# Patient Record
Sex: Male | Born: 1960
Health system: Southern US, Community
[De-identification: ages and names within clinical notes are randomized; demographics above are authoritative.]

## PROBLEM LIST (undated history)

## (undated) DIAGNOSIS — M545 Low back pain, unspecified: Secondary | ICD-10-CM

## (undated) DIAGNOSIS — I639 Cerebral infarction, unspecified: Secondary | ICD-10-CM

## (undated) DIAGNOSIS — K219 Gastro-esophageal reflux disease without esophagitis: Secondary | ICD-10-CM

## (undated) DIAGNOSIS — F329 Major depressive disorder, single episode, unspecified: Secondary | ICD-10-CM

## (undated) DIAGNOSIS — I10 Essential (primary) hypertension: Secondary | ICD-10-CM

## (undated) DIAGNOSIS — M1990 Unspecified osteoarthritis, unspecified site: Secondary | ICD-10-CM

## (undated) DIAGNOSIS — G4733 Obstructive sleep apnea (adult) (pediatric): Secondary | ICD-10-CM

## (undated) DIAGNOSIS — I219 Acute myocardial infarction, unspecified: Secondary | ICD-10-CM

## (undated) DIAGNOSIS — I2510 Atherosclerotic heart disease of native coronary artery without angina pectoris: Secondary | ICD-10-CM

## (undated) DIAGNOSIS — Z9981 Dependence on supplemental oxygen: Secondary | ICD-10-CM

## (undated) DIAGNOSIS — E7800 Pure hypercholesterolemia, unspecified: Secondary | ICD-10-CM

## (undated) DIAGNOSIS — F32A Depression, unspecified: Secondary | ICD-10-CM

## (undated) DIAGNOSIS — I509 Heart failure, unspecified: Secondary | ICD-10-CM

## (undated) DIAGNOSIS — G8929 Other chronic pain: Secondary | ICD-10-CM

## (undated) HISTORY — PX: FRACTURE SURGERY: SHX138

## (undated) HISTORY — PX: CORONARY ANGIOPLASTY WITH STENT PLACEMENT: SHX49

## (undated) HISTORY — PX: CORONARY ANGIOPLASTY: SHX604

---

## 1988-02-25 HISTORY — PX: TOTAL HIP ARTHROPLASTY: SHX124

## 2003-02-25 DIAGNOSIS — I639 Cerebral infarction, unspecified: Secondary | ICD-10-CM

## 2003-02-25 HISTORY — DX: Cerebral infarction, unspecified: I63.9

## 2008-04-23 ENCOUNTER — Inpatient Hospital Stay: Payer: Self-pay | Admitting: Internal Medicine

## 2008-06-11 ENCOUNTER — Emergency Department: Payer: Self-pay | Admitting: Emergency Medicine

## 2014-02-24 HISTORY — PX: ANKLE FRACTURE SURGERY: SHX122

## 2016-08-27 ENCOUNTER — Encounter (HOSPITAL_COMMUNITY): Payer: Self-pay | Admitting: Emergency Medicine

## 2016-08-27 ENCOUNTER — Emergency Department (HOSPITAL_COMMUNITY): Payer: Self-pay

## 2016-08-27 ENCOUNTER — Inpatient Hospital Stay (HOSPITAL_COMMUNITY)
Admission: EM | Admit: 2016-08-27 | Discharge: 2016-08-29 | DRG: 251 | Disposition: A | Discharge status: Home / Self Care | Payer: Self-pay | Attending: Internal Medicine | Admitting: Internal Medicine

## 2016-08-27 DIAGNOSIS — Z955 Presence of coronary angioplasty implant and graft: Secondary | ICD-10-CM

## 2016-08-27 DIAGNOSIS — Z9861 Coronary angioplasty status: Secondary | ICD-10-CM

## 2016-08-27 DIAGNOSIS — T82855A Stenosis of coronary artery stent, initial encounter: Principal | ICD-10-CM | POA: Diagnosis present

## 2016-08-27 DIAGNOSIS — Z7982 Long term (current) use of aspirin: Secondary | ICD-10-CM

## 2016-08-27 DIAGNOSIS — E785 Hyperlipidemia, unspecified: Secondary | ICD-10-CM

## 2016-08-27 DIAGNOSIS — Z8249 Family history of ischemic heart disease and other diseases of the circulatory system: Secondary | ICD-10-CM

## 2016-08-27 DIAGNOSIS — I25110 Atherosclerotic heart disease of native coronary artery with unstable angina pectoris: Secondary | ICD-10-CM | POA: Diagnosis present

## 2016-08-27 DIAGNOSIS — R079 Chest pain, unspecified: Secondary | ICD-10-CM

## 2016-08-27 DIAGNOSIS — I10 Essential (primary) hypertension: Secondary | ICD-10-CM | POA: Diagnosis present

## 2016-08-27 DIAGNOSIS — I2510 Atherosclerotic heart disease of native coronary artery without angina pectoris: Secondary | ICD-10-CM

## 2016-08-27 DIAGNOSIS — I252 Old myocardial infarction: Secondary | ICD-10-CM

## 2016-08-27 DIAGNOSIS — Z79899 Other long term (current) drug therapy: Secondary | ICD-10-CM

## 2016-08-27 HISTORY — DX: Dependence on supplemental oxygen: Z99.81

## 2016-08-27 HISTORY — DX: Major depressive disorder, single episode, unspecified: F32.9

## 2016-08-27 HISTORY — DX: Low back pain, unspecified: M54.50

## 2016-08-27 HISTORY — DX: Heart failure, unspecified: I50.9

## 2016-08-27 HISTORY — DX: Essential (primary) hypertension: I10

## 2016-08-27 HISTORY — DX: Unspecified osteoarthritis, unspecified site: M19.90

## 2016-08-27 HISTORY — DX: Low back pain: M54.5

## 2016-08-27 HISTORY — DX: Gastro-esophageal reflux disease without esophagitis: K21.9

## 2016-08-27 HISTORY — DX: Cerebral infarction, unspecified: I63.9

## 2016-08-27 HISTORY — DX: Acute myocardial infarction, unspecified: I21.9

## 2016-08-27 HISTORY — DX: Other chronic pain: G89.29

## 2016-08-27 HISTORY — DX: Pure hypercholesterolemia, unspecified: E78.00

## 2016-08-27 HISTORY — DX: Depression, unspecified: F32.A

## 2016-08-27 HISTORY — DX: Atherosclerotic heart disease of native coronary artery without angina pectoris: I25.10

## 2016-08-27 HISTORY — DX: Obstructive sleep apnea (adult) (pediatric): G47.33

## 2016-08-27 LAB — BASIC METABOLIC PANEL
Anion gap: 7 (ref 5–15)
BUN: <5 mg/dL — ABNORMAL LOW (ref 6–20)
CHLORIDE: 109 mmol/L (ref 101–111)
CO2: 26 mmol/L (ref 22–32)
CREATININE: 0.9 mg/dL (ref 0.61–1.24)
Calcium: 9.4 mg/dL (ref 8.9–10.3)
GFR calc Af Amer: >60 mL/min (ref >60)
GFR calc non Af Amer: >60 mL/min (ref >60)
Glucose, Bld: 99 mg/dL (ref 65–99)
Potassium: 3.6 mmol/L (ref 3.5–5.1)
Sodium: 142 mmol/L (ref 135–145)

## 2016-08-27 LAB — CBC
HCT: 40.6 % (ref 39.0–52.0)
HEMOGLOBIN: 13.9 g/dL (ref 13.0–17.0)
MCH: 32.1 pg (ref 26.0–34.0)
MCHC: 34.2 g/dL (ref 30.0–36.0)
MCV: 93.8 fL (ref 78.0–100.0)
PLATELETS: 164 10*3/uL (ref 150–400)
RBC: 4.33 MIL/uL (ref 4.22–5.81)
RDW: 13.4 % (ref 11.5–15.5)
WBC: 5.3 10*3/uL (ref 4.0–10.5)

## 2016-08-27 LAB — I-STAT TROPONIN, ED: Troponin i, poc: 0.01 ng/mL (ref 0.00–0.08)

## 2016-08-27 MED ORDER — NITROGLYCERIN 0.4 MG SL SUBL
0.4000 mg | SUBLINGUAL_TABLET | SUBLINGUAL | Status: DC | PRN
  Administered 2016-08-27: 0.4 mg via SUBLINGUAL
  Filled 2016-08-27: qty 1

## 2016-08-27 MED ORDER — HYDROCODONE-ACETAMINOPHEN 7.5-325 MG PO TABS
1.0000 | ORAL_TABLET | Freq: Four times a day (QID) | ORAL | Status: DC | PRN
  Administered 2016-08-28: 1 via ORAL
  Filled 2016-08-27: qty 2

## 2016-08-27 MED ORDER — ACETAMINOPHEN 325 MG PO TABS: 650.0000 mg | ORAL_TABLET | ORAL | Status: DC | PRN

## 2016-08-27 MED ORDER — ONDANSETRON HCL 4 MG/2ML IJ SOLN: 4.0000 mg | Freq: Four times a day (QID) | INTRAMUSCULAR | Status: DC | PRN

## 2016-08-27 NOTE — ED Notes (Signed)
Pt c/o chest pain 8/10, admitting MD notified.

## 2016-08-27 NOTE — Progress Notes (Addendum)
ANTICOAGULATION CONSULT NOTE - Initial Consult  Pharmacy Consult for heparin Indication: chest pain/ACS  No Known Allergies  Patient Measurements: Height: 5\' 10"  (177.8 cm) Weight: 165 lb (74.8 kg) IBW/kg (Calculated) : 73 Heparin Dosing Weight: 74.8 kg   Vital Signs: Temp: 97.7 F (36.5 C) (07/04 2134) Temp Source: Oral (07/04 2134) BP: 121/76 (07/04 2315) Pulse Rate: 85 (07/04 2315)  Labs:  Recent Labs  08/27/16 2200  HGB 13.9  HCT 40.6  PLT 164  CREATININE 0.90    Estimated Creatinine Clearance: 93.5 mL/min (by C-G formula based on SCr of 0.9 mg/dL).   Medical History: Past Medical History:  Diagnosis Date  . Hypertension   . MI (myocardial infarction) [facility](HCC)     Assessment: 56 yo male admitted with chest pain. Pharmacy to dose heparin. No oral anticoagulation prior to admission. CBC stable with no overt s/s bleeding.   Goal of Therapy:  Heparin level 0.3-0.7 units/ml Monitor platelets by anticoagulation protocol: Yes   Plan:  Heparin 4000 units IV x1, then start infusion at 900 units/hr Heparin level in 6 hours  Heparin level and CBC daily  Monitor for s/s bleeding F/u cardiology plans   York CeriseKatherine Cook, PharmD Clinical Pharmacist 08/27/16 11:56 PM

## 2016-08-27 NOTE — H&P (Addendum)
CARDIOLOGY OBSERVATION HISTORY AND PHYSICAL EXAMINATION NOTE  Patient ID: Marcelina MorelRex D Dauenhauer MRN: [id number], DOB/AGE: [birth year]   Admit date: 08/27/2016   Primary Physician: Patient, No Pcp Per Primary Cardiologist: new  Reason for admission: chest pain  HPI: This is a 56 y.o. male with known history of  MI s/p multiple PCIs (records not availble), HTN presented with chest pain.  CP started @ 8pm last night when he woke up with CP , substernal, no radiation, 10/10 in intensity, pressure like sensation. Associated with nausea, diaphoresis and dyspnea. He took 2 NTG but did not resolve. On arrival given 4 NTG and 4 mg of IV morphine w/o much relief. No CHF symptoms. [facility] since has moved here. [facility] and received a stent at that time.  Angina frequency 1/ 19mo, usually takes NTG for relief. he sweeps and cleans. He is not physically limited due to angina. Per patient he had 16 stents until now and the doctors at [facility] were considering to send him for CABG but [place].    Problem List: Past Medical History:  Diagnosis Date  . Hypertension   . MI (myocardial infarction) [facility](HCC)     Past Surgical History:  Procedure Laterality Date  . CARDIAC CATHETERIZATION       Allergies: No Known Allergies   Home Medications Current Facility-Administered Medications  Medication Dose Route Frequency Provider Last Rate Last Dose  . nitroGLYCERIN (NITROSTAT) SL tablet 0.4 mg  0.4 mg Sublingual Q5 min PRN Alvira MondaySchlossman, Erin, MD   0.4 mg at 08/27/16 2307   Current Outpatient Prescriptions  Medication Sig Dispense Refill  . amLODipine (NORVASC) 5 MG tablet Take 5 mg by mouth daily.    Marland Kitchen. aspirin 325 MG tablet Take 325 mg by mouth daily.    Marland Kitchen. atorvastatin (LIPITOR) 80 MG tablet Take 80 mg by mouth daily.    . clopidogrel (PLAVIX) 75 MG tablet Take 75 mg by mouth daily.    . isosorbide  dinitrate (ISORDIL) 20 MG tablet Take 20 mg by mouth 3 (three) times daily.    . metoprolol tartrate (LOPRESSOR) 50 MG tablet Take 50 mg by mouth 2 (two) times daily.    . nitroGLYCERIN (NITROSTAT) 0.6 MG SL tablet Place 0.6 mg under the tongue every 5 (five) minutes as needed for chest pain.    Marland Kitchen. omeprazole (PRILOSEC) 20 MG capsule Take 20 mg by mouth daily.      Family History  Problem Relation Age of Onset  . Heart attack Father     Social History   Social History  . Marital status: Single    Spouse name: N/A  . Number of children: N/A  . Years of education: N/A   Occupational History  . Not on file.   Social History Main Topics  . Smoking status: Never Smoker  . Smokeless tobacco: Never Used  . Alcohol use No  . Drug use: No  . Sexual activity: Not on file   Other Topics Concern  . Not on file   Social History Narrative  . No narrative on file     Review of Systems: General: negative for chills, fever, night sweats or weight changes.  Cardiovascular: chest pain, dyspnea negative for dyspnea on exertion, edema, orthopnea, palpitations, paroxysmal nocturnal dyspnea Dermatological: negative for rash Respiratory: negative for cough or wheezing Urologic: negative for hematuria Abdominal: negative for nausea, vomiting,  diarrhea, bright red blood per rectum, melena, or hematemesis Neurologic: negative for visual changes, syncope, or dizziness Endocrine: no diabetes, no hypothyroidism Immunological: no lymph adenopathy Psych: non homicidal/suicidal  Physical Exam: Vitals: BP 121/73   Pulse 78   Temp 97.7 F (36.5 C) (Oral)   Resp 15   Ht 5\' 10"  (1.778 m)   Wt 74.8 kg (165 lb)   SpO2 100%   BMI 23.68 kg/m  General: not in acute distress Neck: JVP flat, neck supple Heart: regular rate and rhythm, S1, S2, no murmurs Lungs: CTAB  GI: non tender, non distended, bowel sounds present Extremities: no edema Neuro: AAO x 3  Psych: normal affect, no  anxiety   Labs:   Results for orders placed or performed during the hospital encounter of 08/27/16 (from the past 24 hour(s))  Basic metabolic panel     Status: Abnormal   Collection Time: 08/27/16 10:00 PM  Result Value Ref Range   Sodium 142 135 - 145 mmol/L   Potassium 3.6 3.5 - 5.1 mmol/L   Chloride 109 101 - 111 mmol/L   CO2 26 22 - 32 mmol/L   Glucose, Bld 99 65 - 99 mg/dL   BUN <5 (L) 6 - 20 mg/dL   Creatinine, Ser 1.61 0.61 - 1.24 mg/dL   Calcium 9.4 8.9 - 09.6 mg/dL   GFR calc non Af Amer >60 >60 mL/min   GFR calc Af Amer >60 >60 mL/min   Anion gap 7 5 - 15  CBC     Status: None   Collection Time: 08/27/16 10:00 PM  Result Value Ref Range   WBC 5.3 4.0 - 10.5 K/uL   RBC 4.33 4.22 - 5.81 MIL/uL   Hemoglobin 13.9 13.0 - 17.0 g/dL   HCT 04.5 40.9 - 81.1 %   MCV 93.8 78.0 - 100.0 fL   MCH 32.1 26.0 - 34.0 pg   MCHC 34.2 30.0 - 36.0 g/dL   RDW 91.4 78.2 - 95.6 %   Platelets 164 150 - 400 K/uL  I-stat troponin, ED     Status: None   Collection Time: 08/27/16 10:12 PM  Result Value Ref Range   Troponin i, poc 0.01 0.00 - 0.08 ng/mL   Comment 3             Radiology/Studies: Dg Chest 2 View  Result Date: 08/27/2016 CLINICAL DATA:  Chest pain EXAM: CHEST  2 VIEW COMPARISON:  None. FINDINGS: The lungs are hyperexpanded with prominent interstitial markings. No focal consolidation or overt pulmonary edema. Cardiomediastinal contours are normal. No pleural effusion or pneumothorax. IMPRESSION: Hyperinflation suggesting COPD without acute cardiopulmonary disease. Electronically Signed   By: Deatra Robinson M.D.   On: 08/27/2016 22:33     Medical decision making:  Discussed care with the patient Discussed care with the physician on the phone Reviewed labs and imaging personally Reviewed prior records  ASSESSMENT AND PLAN:  This is a 56 y.o. male with known CAD s/p several PCIs and MIs presented with another episode of chest pain not relieved with NTG with negative trop and  some repol changes on EKG. Records not available.   Active Problems:   Chest pain with moderate risk of acute coronary syndrome  Chest pain with moderate risk of ACS HEART SCORE = 5 (moderate risk)  increasing episodes of chest pain, pain at rest - admit to telemetry floor  - recommend IV heparin - cycle troponin - echocardiogram in the AM - NPO post midnight - CBC,  CMP, INR/PTT - TSH, HbA1c, lipid panel - obtain records from outside hospital and decide on if needs cardiac cath vs. Stress test  - added imdur 90 mg daily  Hypertension, essential Continue home blood pressure meds Goal <130/90   Signed, Joellyn Rued, MD MS 08/27/2016, 11:19 PM

## 2016-08-27 NOTE — ED Triage Notes (Signed)
Per EMS, pt from home with c/o central chest pain/pressure, pt denies radiation. Pt diaphoretic initially, c/o mild shortness of breath. Hx of MI x 3, with 16 stents. Pt has had total of 4 NTG and 4mg  morphine with no relief. EMS vitals: BP-1285/90, HR-140

## 2016-08-28 ENCOUNTER — Encounter (HOSPITAL_COMMUNITY): Payer: Self-pay | Admitting: Internal Medicine

## 2016-08-28 ENCOUNTER — Encounter (HOSPITAL_COMMUNITY)
Admission: EM | Disposition: A | Discharge status: Home / Self Care | Payer: Self-pay | Source: Home / Self Care | Attending: Internal Medicine

## 2016-08-28 DIAGNOSIS — I10 Essential (primary) hypertension: Secondary | ICD-10-CM | POA: Diagnosis present

## 2016-08-28 DIAGNOSIS — I2510 Atherosclerotic heart disease of native coronary artery without angina pectoris: Secondary | ICD-10-CM

## 2016-08-28 DIAGNOSIS — I25118 Atherosclerotic heart disease of native coronary artery with other forms of angina pectoris: Secondary | ICD-10-CM

## 2016-08-28 DIAGNOSIS — R079 Chest pain, unspecified: Secondary | ICD-10-CM

## 2016-08-28 HISTORY — PX: CORONARY BALLOON ANGIOPLASTY: CATH118233

## 2016-08-28 HISTORY — PX: LEFT HEART CATH AND CORONARY ANGIOGRAPHY: CATH118249

## 2016-08-28 HISTORY — DX: Atherosclerotic heart disease of native coronary artery without angina pectoris: I25.10

## 2016-08-28 LAB — LIPID PANEL
Cholesterol: 125 mg/dL (ref 0–200)
HDL: 74 mg/dL (ref >40)
LDL CALC: 40 mg/dL (ref 0–99)
Total CHOL/HDL Ratio: 1.7 RATIO
Triglycerides: 57 mg/dL (ref <150)
VLDL: 11 mg/dL (ref 0–40)

## 2016-08-28 LAB — CBC
HCT: 40.2 % (ref 39.0–52.0)
HEMATOCRIT: 41 % (ref 39.0–52.0)
HEMOGLOBIN: 13.8 g/dL (ref 13.0–17.0)
Hemoglobin: 13.5 g/dL (ref 13.0–17.0)
MCH: 31.8 pg (ref 26.0–34.0)
MCH: 32.3 pg (ref 26.0–34.0)
MCHC: 33.6 g/dL (ref 30.0–36.0)
MCHC: 33.7 g/dL (ref 30.0–36.0)
MCV: 94.8 fL (ref 78.0–100.0)
MCV: 96 fL (ref 78.0–100.0)
PLATELETS: 152 10*3/uL (ref 150–400)
Platelets: 136 10*3/uL — ABNORMAL LOW (ref 150–400)
RBC: 4.24 MIL/uL (ref 4.22–5.81)
RBC: 4.27 MIL/uL (ref 4.22–5.81)
RDW: 13.6 % (ref 11.5–15.5)
RDW: 13.7 % (ref 11.5–15.5)
WBC: 5.9 10*3/uL (ref 4.0–10.5)
WBC: 5.6 10*3/uL (ref 4.0–10.5)

## 2016-08-28 LAB — BRAIN NATRIURETIC PEPTIDE: B NATRIURETIC PEPTIDE 5: 40.4 pg/mL (ref 0.0–100.0)

## 2016-08-28 LAB — RAPID URINE DRUG SCREEN, HOSP PERFORMED
AMPHETAMINES: NOT DETECTED
Barbiturates: NOT DETECTED
Benzodiazepines: NOT DETECTED
Cocaine: NOT DETECTED
Opiates: POSITIVE — AB
Tetrahydrocannabinol: NOT DETECTED

## 2016-08-28 LAB — COMPREHENSIVE METABOLIC PANEL
ALT: 16 U/L — AB (ref 17–63)
AST: 20 U/L (ref 15–41)
Albumin: 3.9 g/dL (ref 3.5–5.0)
Alkaline Phosphatase: 69 U/L (ref 38–126)
Anion gap: 9 (ref 5–15)
BILIRUBIN TOTAL: 0.8 mg/dL (ref 0.3–1.2)
CALCIUM: 9.1 mg/dL (ref 8.9–10.3)
CHLORIDE: 109 mmol/L (ref 101–111)
CO2: 24 mmol/L (ref 22–32)
CREATININE: 0.94 mg/dL (ref 0.61–1.24)
Glucose, Bld: 84 mg/dL (ref 65–99)
Potassium: 3.7 mmol/L (ref 3.5–5.1)
Sodium: 142 mmol/L (ref 135–145)
TOTAL PROTEIN: 6.2 g/dL — AB (ref 6.5–8.1)

## 2016-08-28 LAB — TROPONIN I
Troponin I: <0.03 ng/mL (ref <0.03)
Troponin I: <0.03 ng/mL (ref <0.03)

## 2016-08-28 LAB — CREATININE, SERUM
Creatinine, Ser: 0.95 mg/dL (ref 0.61–1.24)
GFR calc Af Amer: >60 mL/min (ref >60)
GFR calc non Af Amer: >60 mL/min (ref >60)

## 2016-08-28 LAB — MRSA PCR SCREENING: MRSA BY PCR: NEGATIVE

## 2016-08-28 LAB — PROTIME-INR
INR: 0.94
PROTHROMBIN TIME: 12.6 s (ref 11.4–15.2)

## 2016-08-28 LAB — GLUCOSE, CAPILLARY: Glucose-Capillary: 90 mg/dL (ref 65–99)

## 2016-08-28 LAB — HIV ANTIBODY (ROUTINE TESTING W REFLEX): HIV Screen 4th Generation wRfx: NONREACTIVE

## 2016-08-28 LAB — HEPARIN LEVEL (UNFRACTIONATED): Heparin Unfractionated: 0.33 IU/mL (ref 0.30–0.70)

## 2016-08-28 LAB — TSH: TSH: 4.605 u[IU]/mL — AB (ref 0.350–4.500)

## 2016-08-28 SURGERY — LEFT HEART CATH AND CORONARY ANGIOGRAPHY
Anesthesia: LOCAL | Laterality: Right

## 2016-08-28 MED ORDER — NITROGLYCERIN IN D5W 200-5 MCG/ML-% IV SOLN: 0.0000 ug/min | INTRAVENOUS | Status: DC

## 2016-08-28 MED ORDER — FENTANYL CITRATE (PF) 100 MCG/2ML IJ SOLN
INTRAMUSCULAR | Status: AC
  Filled 2016-08-28: qty 2

## 2016-08-28 MED ORDER — IOPAMIDOL (ISOVUE-370) INJECTION 76%
INTRAVENOUS | Status: AC
  Filled 2016-08-28: qty 100

## 2016-08-28 MED ORDER — ACETAMINOPHEN 325 MG PO TABS: 650.0000 mg | ORAL_TABLET | ORAL | Status: DC | PRN

## 2016-08-28 MED ORDER — NITROGLYCERIN 0.6 MG SL SUBL: 0.6000 mg | SUBLINGUAL_TABLET | SUBLINGUAL | Status: DC | PRN

## 2016-08-28 MED ORDER — IOPAMIDOL (ISOVUE-370) INJECTION 76%
INTRAVENOUS | Status: AC
  Filled 2016-08-28: qty 50

## 2016-08-28 MED ORDER — SODIUM CHLORIDE 0.9 % IV SOLN
INTRAVENOUS | Status: DC
  Administered 2016-08-28: 09:00:00 via INTRAVENOUS
  Administered 2016-08-28: 250 mL via INTRAVENOUS

## 2016-08-28 MED ORDER — HEPARIN SODIUM (PORCINE) 1000 UNIT/ML IJ SOLN
INTRAMUSCULAR | Status: AC
  Filled 2016-08-28: qty 1

## 2016-08-28 MED ORDER — SODIUM CHLORIDE 0.9% FLUSH: 3.0000 mL | INTRAVENOUS | Status: DC | PRN

## 2016-08-28 MED ORDER — ASPIRIN 81 MG PO CHEW
81.0000 mg | CHEWABLE_TABLET | Freq: Every day | ORAL | Status: DC
  Administered 2016-08-29: 81 mg via ORAL
  Filled 2016-08-28: qty 1

## 2016-08-28 MED ORDER — FENTANYL CITRATE (PF) 100 MCG/2ML IJ SOLN
INTRAMUSCULAR | Status: DC | PRN
  Administered 2016-08-28: 50 ug via INTRAVENOUS

## 2016-08-28 MED ORDER — AMLODIPINE BESYLATE 5 MG PO TABS
5.0000 mg | ORAL_TABLET | Freq: Every day | ORAL | Status: DC
  Administered 2016-08-28 – 2016-08-29 (×2): 5 mg via ORAL
  Filled 2016-08-28 (×2): qty 1

## 2016-08-28 MED ORDER — ASPIRIN 81 MG PO CHEW: 81.0000 mg | CHEWABLE_TABLET | ORAL | Status: DC

## 2016-08-28 MED ORDER — LABETALOL HCL 5 MG/ML IV SOLN: 10.0000 mg | INTRAVENOUS | Status: AC | PRN

## 2016-08-28 MED ORDER — SODIUM CHLORIDE 0.9 % IV SOLN: 250.0000 mL | INTRAVENOUS | Status: DC | PRN

## 2016-08-28 MED ORDER — CLOPIDOGREL BISULFATE 300 MG PO TABS
ORAL_TABLET | ORAL | Status: DC | PRN
  Administered 2016-08-28: 225 mg via ORAL

## 2016-08-28 MED ORDER — HYDRALAZINE HCL 20 MG/ML IJ SOLN: 5.0000 mg | INTRAMUSCULAR | Status: AC | PRN

## 2016-08-28 MED ORDER — HEPARIN (PORCINE) IN NACL 2-0.9 UNIT/ML-% IJ SOLN
INTRAMUSCULAR | Status: AC | PRN
  Administered 2016-08-28: 1000 mL

## 2016-08-28 MED ORDER — SODIUM CHLORIDE 0.9 % IV SOLN: INTRAVENOUS | Status: AC

## 2016-08-28 MED ORDER — MIDAZOLAM HCL 2 MG/2ML IJ SOLN
INTRAMUSCULAR | Status: DC | PRN
  Administered 2016-08-28: 1 mg via INTRAVENOUS

## 2016-08-28 MED ORDER — ASPIRIN 325 MG PO TABS
325.0000 mg | ORAL_TABLET | Freq: Every day | ORAL | Status: DC
  Administered 2016-08-28: 325 mg via ORAL
  Filled 2016-08-28: qty 1

## 2016-08-28 MED ORDER — HEPARIN SODIUM (PORCINE) 1000 UNIT/ML IJ SOLN
INTRAMUSCULAR | Status: DC | PRN
  Administered 2016-08-28: 4000 [IU] via INTRAVENOUS
  Administered 2016-08-28: 2500 [IU] via INTRAVENOUS
  Administered 2016-08-28: 6000 [IU] via INTRAVENOUS

## 2016-08-28 MED ORDER — MIDAZOLAM HCL 2 MG/2ML IJ SOLN
INTRAMUSCULAR | Status: AC
  Filled 2016-08-28: qty 2

## 2016-08-28 MED ORDER — NITROGLYCERIN IN D5W 200-5 MCG/ML-% IV SOLN
0.0000 ug/min | INTRAVENOUS | Status: DC
  Administered 2016-08-28: 10 ug/min via INTRAVENOUS
  Filled 2016-08-28: qty 250

## 2016-08-28 MED ORDER — IOPAMIDOL (ISOVUE-370) INJECTION 76%
INTRAVENOUS | Status: DC | PRN
  Administered 2016-08-28: 250 mL via INTRA_ARTERIAL

## 2016-08-28 MED ORDER — ASPIRIN EC 81 MG PO TBEC: 81.0000 mg | DELAYED_RELEASE_TABLET | Freq: Every day | ORAL | Status: DC

## 2016-08-28 MED ORDER — SODIUM CHLORIDE 0.9% FLUSH: 3.0000 mL | Freq: Two times a day (BID) | INTRAVENOUS | Status: DC

## 2016-08-28 MED ORDER — LIDOCAINE HCL (PF) 1 % IJ SOLN
INTRAMUSCULAR | Status: AC
  Filled 2016-08-28: qty 30

## 2016-08-28 MED ORDER — CLOPIDOGREL BISULFATE 75 MG PO TABS
75.0000 mg | ORAL_TABLET | Freq: Every day | ORAL | Status: DC
  Administered 2016-08-28: 75 mg via ORAL
  Filled 2016-08-28: qty 1

## 2016-08-28 MED ORDER — PANTOPRAZOLE SODIUM 40 MG PO TBEC
40.0000 mg | DELAYED_RELEASE_TABLET | Freq: Every day | ORAL | Status: DC
  Administered 2016-08-28 – 2016-08-29 (×2): 40 mg via ORAL
  Filled 2016-08-28 (×2): qty 1

## 2016-08-28 MED ORDER — ZOLPIDEM TARTRATE 5 MG PO TABS
5.0000 mg | ORAL_TABLET | Freq: Every evening | ORAL | Status: DC | PRN
  Filled 2016-08-28: qty 1

## 2016-08-28 MED ORDER — ATORVASTATIN CALCIUM 80 MG PO TABS
80.0000 mg | ORAL_TABLET | Freq: Every day | ORAL | Status: DC
  Administered 2016-08-28: 80 mg via ORAL
  Filled 2016-08-28: qty 1

## 2016-08-28 MED ORDER — VERAPAMIL HCL 2.5 MG/ML IV SOLN
INTRAVENOUS | Status: DC | PRN
  Administered 2016-08-28: 10 mL via INTRA_ARTERIAL

## 2016-08-28 MED ORDER — HEPARIN SODIUM (PORCINE) 5000 UNIT/ML IJ SOLN
5000.0000 [IU] | Freq: Three times a day (TID) | INTRAMUSCULAR | Status: DC
  Administered 2016-08-29: 5000 [IU] via SUBCUTANEOUS
  Filled 2016-08-28: qty 1

## 2016-08-28 MED ORDER — VERAPAMIL HCL 2.5 MG/ML IV SOLN
INTRAVENOUS | Status: AC
  Filled 2016-08-28: qty 2

## 2016-08-28 MED ORDER — ANGIOPLASTY BOOK
Freq: Once | Status: AC
  Administered 2016-08-28: 16:00:00
  Filled 2016-08-28: qty 1

## 2016-08-28 MED ORDER — LIDOCAINE HCL (PF) 1 % IJ SOLN
INTRAMUSCULAR | Status: DC | PRN
  Administered 2016-08-28: 2 mL via INTRADERMAL

## 2016-08-28 MED ORDER — CLOPIDOGREL BISULFATE 75 MG PO TABS
75.0000 mg | ORAL_TABLET | Freq: Every day | ORAL | Status: DC
  Administered 2016-08-29: 75 mg via ORAL
  Filled 2016-08-28: qty 1

## 2016-08-28 MED ORDER — SODIUM CHLORIDE 0.9% FLUSH
3.0000 mL | Freq: Two times a day (BID) | INTRAVENOUS | Status: DC
  Administered 2016-08-28 (×2): 3 mL via INTRAVENOUS

## 2016-08-28 MED ORDER — HEPARIN (PORCINE) IN NACL 100-0.45 UNIT/ML-% IJ SOLN
1000.0000 [IU]/h | INTRAMUSCULAR | Status: DC
  Administered 2016-08-28: 900 [IU]/h via INTRAVENOUS
  Filled 2016-08-28: qty 250

## 2016-08-28 MED ORDER — OXYCODONE-ACETAMINOPHEN 5-325 MG PO TABS
1.0000 | ORAL_TABLET | ORAL | Status: DC | PRN
  Administered 2016-08-28 (×2): 2 via ORAL
  Filled 2016-08-28 (×2): qty 2

## 2016-08-28 MED ORDER — HEPARIN (PORCINE) IN NACL 2-0.9 UNIT/ML-% IJ SOLN
INTRAMUSCULAR | Status: AC
  Filled 2016-08-28: qty 500

## 2016-08-28 MED ORDER — ONDANSETRON HCL 4 MG/2ML IJ SOLN: 4.0000 mg | Freq: Four times a day (QID) | INTRAMUSCULAR | Status: DC | PRN

## 2016-08-28 MED ORDER — HEPARIN BOLUS VIA INFUSION
4000.0000 [IU] | Freq: Once | INTRAVENOUS | Status: AC
  Administered 2016-08-28: 4000 [IU] via INTRAVENOUS
  Filled 2016-08-28: qty 4000

## 2016-08-28 MED ORDER — METOPROLOL TARTRATE 25 MG PO TABS
50.0000 mg | ORAL_TABLET | Freq: Two times a day (BID) | ORAL | Status: DC
  Administered 2016-08-28 – 2016-08-29 (×3): 50 mg via ORAL
  Filled 2016-08-28: qty 1
  Filled 2016-08-28 (×2): qty 2

## 2016-08-28 MED ORDER — CLOPIDOGREL BISULFATE 75 MG PO TABS
ORAL_TABLET | ORAL | Status: AC
  Filled 2016-08-28: qty 3

## 2016-08-28 SURGICAL SUPPLY — 24 items
BALLN SAPPHIRE ~~LOC~~ 4.0X12 (BALLOONS) ×3 IMPLANT
BALLN WOLVERINE 4.00X10 (BALLOONS) ×3
BALLN ~~LOC~~ EUPHORA RX 3.0X12 (BALLOONS) ×3
BALLOON WOLVERINE 4.00X10 (BALLOONS) ×2 IMPLANT
BALLOON ~~LOC~~ EUPHORA RX 3.0X12 (BALLOONS) ×2 IMPLANT
CATH HEARTRAIL IKARI 6F IR1.0 (CATHETERS) ×3 IMPLANT
CATH INFINITI 5 FR JL3.5 (CATHETERS) ×3 IMPLANT
CATH INFINITI JR4 5F (CATHETERS) ×3 IMPLANT
CATH LAUNCHER 6FR 3DRC (CATHETERS) ×2 IMPLANT
CATH LAUNCHER 6FR AR1 (CATHETERS) ×3 IMPLANT
CATH LAUNCHER 6FR IMA (CATHETERS) ×3 IMPLANT
CATH VISTA GUIDE 6FR AL1 (CATHETERS) ×3 IMPLANT
CATH VISTA GUIDE 6FR XBRCA (CATHETERS) ×3 IMPLANT
CATHETER LAUNCHER 6FR 3DRC (CATHETERS) ×3
DEVICE RAD COMP TR BAND LRG (VASCULAR PRODUCTS) ×3 IMPLANT
GLIDESHEATH SLEND A-KIT 6F 22G (SHEATH) ×3 IMPLANT
GUIDEWIRE INQWIRE 1.5J.035X260 (WIRE) ×2 IMPLANT
INQWIRE 1.5J .035X260CM (WIRE) ×3
KIT ENCORE 26 ADVANTAGE (KITS) ×3 IMPLANT
KIT HEART LEFT (KITS) ×3 IMPLANT
PACK CARDIAC CATHETERIZATION (CUSTOM PROCEDURE TRAY) ×3 IMPLANT
TRANSDUCER W/STOPCOCK (MISCELLANEOUS) ×3 IMPLANT
TUBING CIL FLEX 10 FLL-RA (TUBING) ×3 IMPLANT
WIRE ASAHI PROWATER 180CM (WIRE) ×3 IMPLANT

## 2016-08-28 NOTE — Progress Notes (Signed)
TR BAND REMOVAL  LOCATION:    right radial  DEFLATED PER PROTOCOL:    Yes.    TIME BAND OFF / DRESSING APPLIED:    18:45   SITE UPON ARRIVAL:    Level 0  SITE AFTER BAND REMOVAL:    Level 0  CIRCULATION SENSATION AND MOVEMENT:    Within Normal Limits   Yes.    COMMENTS:   Post TR band instructions given. Pt tolerated well.

## 2016-08-28 NOTE — Interval H&P Note (Signed)
Cath Lab Visit (complete for each Cath Lab visit)  Clinical Evaluation Leading to the Procedure:   ACS: Yes.    Non-ACS:    Anginal Classification: CCS III  Anti-ischemic medical therapy: Maximal Therapy (2 or more classes of medications)  Non-Invasive Test Results: No non-invasive testing performed  Prior CABG: No previous CABG      History and Physical Interval Note:  08/28/2016 12:23 PM  Connor Baker  has presented today for surgery, with the diagnosis of cp  The various methods of treatment have been discussed with the patient and family. After consideration of risks, benefits and other options for treatment, the patient has consented to  Procedure(s): Left Heart Cath and Coronary Angiography (N/A) as a surgical intervention .  The patient's history has been reviewed, patient examined, no change in status, stable for surgery.  I have reviewed the patient's chart and labs.  Questions were answered to the patient's satisfaction.     Lyn RecordsHenry W Smith III

## 2016-08-28 NOTE — ED Notes (Signed)
Pt calls out for pain medicine which is due now. Pt states he has chest pain that is heaviness , pressure that is 5/10. Pt further states he took his own nitro sl about 6 am without informing nite nurse but got no relief from that. Will contact admitting MD to confirm treatment.

## 2016-08-28 NOTE — ED Provider Notes (Signed)
[facility]-EMERGENCY DEPT Provider Note   CSN: [id number] Arrival date & time: 08/27/16  2131     History   Chief Complaint Chief Complaint  Patient presents with  . Chest Pain    HPI Connor Baker is a 56 y.o. male.  HPI   56 year old male presents with concern for chest pain. Describes it as a central chest pressure without radiation. It began about 8 PM. Reports that he was out working today, and slept, and woke up with chest pressure, nausea, diaphhoresis and dyspnea. Reports that the discomfort is the same as his prior episodes of ACS and stenting. Reports his last catheterization was one year ago at [facility], where he had a stent placed. Reports he's had a total of 16 stents placed.  Denies history of DVT or PE, asymmetric leg pain or swelling, recent surgeries or immobilization, hemoptysis, cough or fever.  Reports he took aspirin 325mg  at home, and took nitro SL with EMS. Nitro helped CP initially from 9 down to 6 however now pain has increased to 7/10.  Reports pain worse with minimal exertion around his home.   Past Medical History:  Diagnosis Date  . Hypertension   . MI (myocardial infarction) [facility])     Patient Active Problem List   Diagnosis Date Noted  . Chest pain with moderate risk of acute coronary syndrome 08/27/2016    Past Surgical History:  Procedure Laterality Date  . CARDIAC CATHETERIZATION         Home Medications    Prior to Admission medications   Medication Sig Start Date End Date Taking? Authorizing Provider  amLODipine (NORVASC) 5 MG tablet Take 5 mg by mouth daily.   Yes [provider]  aspirin 325 MG tablet Take 325 mg by mouth daily.   Yes [provider]  atorvastatin (LIPITOR) 80 MG tablet Take 80 mg by mouth daily.   Yes [provider]  clopidogrel (PLAVIX) 75 MG tablet Take 75 mg by mouth daily.   Yes [provider]  isosorbide dinitrate (ISORDIL) 20 MG tablet Take 20 mg by mouth 3 (three)  times daily.   Yes [provider]  metoprolol tartrate (LOPRESSOR) 50 MG tablet Take 50 mg by mouth 2 (two) times daily.   Yes [provider]  nitroGLYCERIN (NITROSTAT) 0.6 MG SL tablet Place 0.6 mg under the tongue every 5 (five) minutes as needed for chest pain.   Yes [provider]  omeprazole (PRILOSEC) 20 MG capsule Take 20 mg by mouth daily.   Yes [provider]    Family History No family history on file.  Social History Social History  Substance Use Topics  . Smoking status: Never Smoker  . Smokeless tobacco: Never Used  . Alcohol use No     Allergies   Patient has no known allergies.   Review of Systems Review of Systems  Constitutional: Positive for diaphoresis and fatigue. Negative for fever.  HENT: Negative for sore throat.   Eyes: Negative for visual disturbance.  Respiratory: Positive for shortness of breath.   Cardiovascular: Positive for chest pain.  Gastrointestinal: Positive for nausea. Negative for abdominal pain, constipation, diarrhea and vomiting.  Genitourinary: Negative for difficulty urinating.  Musculoskeletal: Negative for back pain and neck stiffness.  Skin: Negative for rash.  Neurological: Positive for light-headedness. Negative for syncope and headaches.     Physical Exam Updated Vital Signs BP 100/67   Pulse 63   Temp 97.7 F (36.5 C) (Oral)  Resp 12   Ht 5\' 10"  (1.778 m)   Wt 74.8 kg (165 lb)   SpO2 96%   BMI 23.68 kg/m   Physical Exam  Constitutional: He is oriented to person, place, and time. He appears well-developed and well-nourished. No distress.  HENT:  Head: Normocephalic and atraumatic.  Eyes: Conjunctivae and EOM are normal.  Neck: Normal range of motion.  Cardiovascular: Normal rate, regular rhythm, normal heart sounds and intact distal pulses.  Exam reveals no gallop and no friction rub.   No murmur heard. Pulmonary/Chest: Effort normal and breath sounds normal. No  respiratory distress. He has no wheezes. He has no rales.  Abdominal: Soft. He exhibits no distension. There is no tenderness. There is no guarding.  Musculoskeletal: He exhibits no edema.  Neurological: He is alert and oriented to person, place, and time.  Skin: Skin is warm and dry. He is not diaphoretic.  Nursing note and vitals reviewed.    ED Treatments / Results  Labs (all labs ordered are listed, but only abnormal results are displayed) Labs Reviewed  BASIC METABOLIC PANEL - Abnormal; Notable for the following:       Result Value   BUN <5 (*)    All other components within normal limits  CBC  HEPARIN LEVEL (UNFRACTIONATED)  CBC  RAPID URINE DRUG SCREEN, HOSP PERFORMED  I-STAT TROPOININ, ED    EKG  EKG Interpretation  Date/Time:  Wednesday August 27 2016 21:33:13 EDT Ventricular Rate:  93 PR Interval:    QRS Duration: 91 QT Interval:  343 QTC Calculation: 427 R Axis:   78 Text Interpretation:  Sinus rhythm Borderline repolarization abnormality Since prior ECG, rate has increased Confirmed by Alvira Monday ([id number]) on 08/27/2016 11:01:12 PM       Radiology Dg Chest 2 View  Result Date: 08/27/2016 CLINICAL DATA:  Chest pain EXAM: CHEST  2 VIEW COMPARISON:  None. FINDINGS: The lungs are hyperexpanded with prominent interstitial markings. No focal consolidation or overt pulmonary edema. Cardiomediastinal contours are normal. No pleural effusion or pneumothorax. IMPRESSION: Hyperinflation suggesting COPD without acute cardiopulmonary disease. Electronically Signed   By: Deatra Robinson M.D.   On: 08/27/2016 22:33    Procedures Procedures (including critical care time)  Medications Ordered in ED Medications  nitroGLYCERIN (NITROSTAT) SL tablet 0.4 mg (0.4 mg Sublingual Given 08/27/16 2307)  acetaminophen (TYLENOL) tablet 650 mg (not administered)  ondansetron (ZOFRAN) injection 4 mg (not administered)  HYDROcodone-acetaminophen (NORCO) 7.5-325 MG per tablet 1-2 tablet (1  tablet Oral Given 08/28/16 0022)  heparin ADULT infusion 100 units/mL (25000 units/271mL sodium chloride 0.45%) (900 Units/hr Intravenous New Bag/Given 08/28/16 0026)  heparin bolus via infusion 4,000 Units (4,000 Units Intravenous Bolus from Bag 08/28/16 0026)     Initial Impression / Assessment and Plan / ED Course  I have reviewed the triage vital signs and the nursing notes.  Pertinent labs & imaging results that were available during my care of the patient were reviewed by me and considered in my medical decision making (see chart for details).     56 year old male with history of hypertension and coronary artery disease, who reports a history of 16 prior stents placed, most recent one year ago at [facility] who presents with concern for chest pressure similar to his prior cardiac events.  Unfortunately, I am unable to view much of his hospital records, including most recent catheterization or hospital stay, but am able to see plans for catheterization in 2009.   EKG without significant  findings. No hypoxia, no tachypnea, no asymmetric leg swelling, no hx of DVT/PE and given pt reports this is similar to prior ACS have low suspicion for PE.  Equal bilateral upper and lower extremity pulses, doubt dissection by history and physical exam. CXR without acute findings.  Initial troponin negative. Pt received ASA at home. Given additional nitro for continuing pain.  Discussed with Cardiology given patient's history of multiple stents, CAD, report that symptoms are identical to anginal symptoms.  Pt admitted for further care.   Final Clinical Impressions(s) / ED Diagnoses   Final diagnoses:  Chest pain with high risk for cardiac etiology    New Prescriptions New Prescriptions   No medications on file     Alvira MondaySchlossman, Valerie Fredin, MD 08/28/16 [phone]

## 2016-08-28 NOTE — H&P (View-Only) (Signed)
Progress Note  Patient Name: Connor MorelRex D Mczeal Date of Encounter: 08/28/2016  Primary Cardiologist: New  Subjective   Still having chest pain. No dyspnea.   Inpatient Medications    Scheduled Meds: . amLODipine  5 mg Oral Daily  . aspirin  325 mg Oral Daily  . atorvastatin  80 mg Oral Daily  . clopidogrel  75 mg Oral Daily  . metoprolol tartrate  50 mg Oral BID  . pantoprazole  40 mg Oral Daily  . sodium chloride flush  3 mL Intravenous Q12H   Continuous Infusions: . heparin 1,000 Units/hr (08/28/16 0742)   PRN Meds: acetaminophen, HYDROcodone-acetaminophen, nitroGLYCERIN, nitroGLYCERIN, ondansetron (ZOFRAN) IV   Vital Signs    Vitals:   08/28/16 0700 08/28/16 0730 08/28/16 0743 08/28/16 0800  BP: 120/79 99/64 97/62  121/80  Pulse: 67 (!) 57 64   Resp: 16 12 (!) 21 10  Temp:      TempSrc:      SpO2: 95% 96% 97%   Weight:      Height:       No intake or output data in the 24 hours ending 08/28/16 0830 Filed Weights   08/27/16 2135  Weight: 165 lb (74.8 kg)    Telemetry    sinus - Personally Reviewed  ECG    Sinus, subtle ST abnormality - Personally Reviewed  Physical Exam   GEN: No acute distress.   Neck: No JVD Cardiac: RRR, no murmurs, rubs, or gallops.  Respiratory: Clear to auscultation bilaterally. GI: Soft, nontender, non-distended  MS: No edema; No deformity. Neuro:  Nonfocal  Psych: Normal affect   Labs    Chemistry Recent Labs Lab 08/27/16 2200 08/28/16 0502  NA 142 142  K 3.6 3.7  CL 109 109  CO2 26 24  GLUCOSE 99 84  BUN <5* <5*  CREATININE 0.90 0.94  CALCIUM 9.4 9.1  PROT  --  6.2*  ALBUMIN  --  3.9  AST  --  20  ALT  --  16*  ALKPHOS  --  69  BILITOT  --  0.8  GFRNONAA >60 >60  GFRAA >60 >60  ANIONGAP 7 9     Hematology Recent Labs Lab 08/27/16 2200 08/28/16 0502  WBC 5.3 5.9  RBC 4.33 4.24  HGB 13.9 13.5  HCT 40.6 40.2  MCV 93.8 94.8  MCH 32.1 31.8  MCHC 34.2 33.6  RDW 13.4 13.6  PLT 164 152     Cardiac Enzymes Recent Labs Lab 08/28/16 0502  TROPONINI <0.03    Recent Labs Lab 08/27/16 2212  TROPIPOC 0.01     BNP Recent Labs Lab 08/28/16 0502  BNP 40.4     DDimer No results for input(s): DDIMER in the last 168 hours.   Radiology    Dg Chest 2 View  Result Date: 08/27/2016 CLINICAL DATA:  Chest pain EXAM: CHEST  2 VIEW COMPARISON:  None. FINDINGS: The lungs are hyperexpanded with prominent interstitial markings. No focal consolidation or overt pulmonary edema. Cardiomediastinal contours are normal. No pleural effusion or pneumothorax. IMPRESSION: Hyperinflation suggesting COPD without acute cardiopulmonary disease. Electronically Signed   By: Deatra RobinsonKevin  Herman M.D.   On: 08/27/2016 22:33    Cardiac Studies     Patient Profile     56 y.o. male with history of HTN and CAD who is presenting with unstable angina.   Assessment & Plan    1. CAD/Unstable Angina: He has reported history of many prior cardiac stents. This was all done in  another hospital. He was told in [place] last year that he may need a CABG but has not followed up. Now living in [place]. He has accelerating angina. Troponin negative. EKG with sinus, subtle ST depression.  -Will plan cardiac cath today to exclude progression of CAD. Risks and benefits of cath reviewed and pt agrees to proceed.  -Continue ASA, Plavix, statin, beta blocker.  -continue IV heparin that was started in the ED -Will start IV NTG -Will assess LVEF by LV gram  2. HTN: BP controlled.   Signed, Verne Carrow, MD  08/28/2016, 8:30 AM

## 2016-08-28 NOTE — Progress Notes (Signed)
ANTICOAGULATION CONSULT NOTE - Follow Up Consult  Pharmacy Consult for heparin Indication: chest pain/ACS  Labs:  Recent Labs  08/27/16 2200 08/28/16 0502 08/28/16 0643  HGB 13.9 13.5  --   HCT 40.6 40.2  --   PLT 164 152  --   LABPROT  --  12.6  --   INR  --  0.94  --   HEPARINUNFRC  --   --  0.33  CREATININE 0.90 0.94  --   TROPONINI  --  <0.03  --     Assessment: 56yo male therapeutic on heparin with initial dosing for CP though at very low end of goal.  Goal of Therapy:  Heparin level 0.3-0.7 units/ml   Plan: Will increase heparin gtt slightly to 1000 units/hr to prevent dropping below goal and check level in 6hr.  Vernard GamblesVeronda Tranika Scholler, PharmD, BCPS  08/28/2016,7:32 AM

## 2016-08-28 NOTE — Progress Notes (Signed)
Progress Note  Patient Name: Connor MorelRex D Mczeal Date of Encounter: 08/28/2016  Primary Cardiologist: New  Subjective   Still having chest pain. No dyspnea.   Inpatient Medications    Scheduled Meds: . amLODipine  5 mg Oral Daily  . aspirin  325 mg Oral Daily  . atorvastatin  80 mg Oral Daily  . clopidogrel  75 mg Oral Daily  . metoprolol tartrate  50 mg Oral BID  . pantoprazole  40 mg Oral Daily  . sodium chloride flush  3 mL Intravenous Q12H   Continuous Infusions: . heparin 1,000 Units/hr (08/28/16 0742)   PRN Meds: acetaminophen, HYDROcodone-acetaminophen, nitroGLYCERIN, nitroGLYCERIN, ondansetron (ZOFRAN) IV   Vital Signs    Vitals:   08/28/16 0700 08/28/16 0730 08/28/16 0743 08/28/16 0800  BP: 120/79 99/64 97/62  121/80  Pulse: 67 (!) 57 64   Resp: 16 12 (!) 21 10  Temp:      TempSrc:      SpO2: 95% 96% 97%   Weight:      Height:       No intake or output data in the 24 hours ending 08/28/16 0830 Filed Weights   08/27/16 2135  Weight: 165 lb (74.8 kg)    Telemetry    sinus - Personally Reviewed  ECG    Sinus, subtle ST abnormality - Personally Reviewed  Physical Exam   GEN: No acute distress.   Neck: No JVD Cardiac: RRR, no murmurs, rubs, or gallops.  Respiratory: Clear to auscultation bilaterally. GI: Soft, nontender, non-distended  MS: No edema; No deformity. Neuro:  Nonfocal  Psych: Normal affect   Labs    Chemistry Recent Labs Lab 08/27/16 2200 08/28/16 0502  NA 142 142  K 3.6 3.7  CL 109 109  CO2 26 24  GLUCOSE 99 84  BUN <5* <5*  CREATININE 0.90 0.94  CALCIUM 9.4 9.1  PROT  --  6.2*  ALBUMIN  --  3.9  AST  --  20  ALT  --  16*  ALKPHOS  --  69  BILITOT  --  0.8  GFRNONAA >60 >60  GFRAA >60 >60  ANIONGAP 7 9     Hematology Recent Labs Lab 08/27/16 2200 08/28/16 0502  WBC 5.3 5.9  RBC 4.33 4.24  HGB 13.9 13.5  HCT 40.6 40.2  MCV 93.8 94.8  MCH 32.1 31.8  MCHC 34.2 33.6  RDW 13.4 13.6  PLT 164 152     Cardiac Enzymes Recent Labs Lab 08/28/16 0502  TROPONINI <0.03    Recent Labs Lab 08/27/16 2212  TROPIPOC 0.01     BNP Recent Labs Lab 08/28/16 0502  BNP 40.4     DDimer No results for input(s): DDIMER in the last 168 hours.   Radiology    Dg Chest 2 View  Result Date: 08/27/2016 CLINICAL DATA:  Chest pain EXAM: CHEST  2 VIEW COMPARISON:  None. FINDINGS: The lungs are hyperexpanded with prominent interstitial markings. No focal consolidation or overt pulmonary edema. Cardiomediastinal contours are normal. No pleural effusion or pneumothorax. IMPRESSION: Hyperinflation suggesting COPD without acute cardiopulmonary disease. Electronically Signed   By: Deatra RobinsonKevin  Herman M.D.   On: 08/27/2016 22:33    Cardiac Studies     Patient Profile     56 y.o. male with history of HTN and CAD who is presenting with unstable angina.   Assessment & Plan    1. CAD/Unstable Angina: He has reported history of many prior cardiac stents. This was all done in  another hospital. He was told in [place] last year that he may need a CABG but has not followed up. Now living in [place]. He has accelerating angina. Troponin negative. EKG with sinus, subtle ST depression.  -Will plan cardiac cath today to exclude progression of CAD. Risks and benefits of cath reviewed and pt agrees to proceed.  -Continue ASA, Plavix, statin, beta blocker.  -continue IV heparin that was started in the ED -Will start IV NTG -Will assess LVEF by LV gram  2. HTN: BP controlled.   Signed, Verne Carrow, MD  08/28/2016, 8:30 AM

## 2016-08-29 DIAGNOSIS — I25110 Atherosclerotic heart disease of native coronary artery with unstable angina pectoris: Secondary | ICD-10-CM

## 2016-08-29 DIAGNOSIS — E785 Hyperlipidemia, unspecified: Secondary | ICD-10-CM

## 2016-08-29 LAB — HEMOGLOBIN A1C
HEMOGLOBIN A1C: 5.3 % (ref 4.8–5.6)
Mean Plasma Glucose: 105 mg/dL

## 2016-08-29 LAB — POCT ACTIVATED CLOTTING TIME
ACTIVATED CLOTTING TIME: 263 s
ACTIVATED CLOTTING TIME: 279 s

## 2016-08-29 LAB — CBC
HEMATOCRIT: 39.2 % (ref 39.0–52.0)
Hemoglobin: 13 g/dL (ref 13.0–17.0)
MCH: 31.6 pg (ref 26.0–34.0)
MCHC: 33.2 g/dL (ref 30.0–36.0)
MCV: 95.1 fL (ref 78.0–100.0)
Platelets: 147 10*3/uL — ABNORMAL LOW (ref 150–400)
RBC: 4.12 MIL/uL — AB (ref 4.22–5.81)
RDW: 13.6 % (ref 11.5–15.5)
WBC: 4.7 10*3/uL (ref 4.0–10.5)

## 2016-08-29 LAB — BASIC METABOLIC PANEL
Anion gap: 6 (ref 5–15)
BUN: 5 mg/dL — AB (ref 6–20)
CHLORIDE: 106 mmol/L (ref 101–111)
CO2: 26 mmol/L (ref 22–32)
CREATININE: 0.9 mg/dL (ref 0.61–1.24)
Calcium: 9.1 mg/dL (ref 8.9–10.3)
GFR calc non Af Amer: >60 mL/min (ref >60)
Glucose, Bld: 100 mg/dL — ABNORMAL HIGH (ref 65–99)
POTASSIUM: 4.1 mmol/L (ref 3.5–5.1)
SODIUM: 138 mmol/L (ref 135–145)

## 2016-08-29 MED ORDER — ASPIRIN 81 MG PO TABS: 81.0000 mg | ORAL_TABLET | Freq: Every day | ORAL | Status: DC

## 2016-08-29 MED ORDER — NITROGLYCERIN 0.4 MG SL SUBL: 0.4000 mg | SUBLINGUAL_TABLET | SUBLINGUAL | 2 refills | Status: DC | PRN

## 2016-08-29 NOTE — Progress Notes (Signed)
CARDIAC REHAB PHASE I   PRE:  Rate/Rhythm: 61 SR  BP:  Supine:   Sitting: 131/76  Standing:    SaO2:   MODE:  Ambulation: 500 ft   POST:  Rate/Rhythm: 66 SR  BP:  Supine:   Sitting: 122/65  Standing:    SaO2:  0905-0950 Pt walked 500 ft with cane with steady gait. No CP and tolerated well. Education completed with pt who voiced understanding. Reviewed NTG use, ex ed and heart healthy diet. Discussed CRP 2 and will refer to [place]. Pt stated he uses the bus to travel and should be able to attend.   Luetta Nuttingharlene Numa Schroeter, RN BSN  08/29/2016 9:47 AM

## 2016-08-29 NOTE — Progress Notes (Signed)
Progress Note  Patient Name: Connor MorelRex D Everding Date of Encounter: 08/29/2016  Primary Cardiologist: New-Mir Fullilove  Subjective   No chest pain or dyspnea  Inpatient Medications    Scheduled Meds: . amLODipine  5 mg Oral Daily  . aspirin  81 mg Oral Daily  . atorvastatin  80 mg Oral q1800  . clopidogrel  75 mg Oral Q breakfast  . heparin  5,000 Units Subcutaneous Q8H  . metoprolol tartrate  50 mg Oral BID  . pantoprazole  40 mg Oral Daily  . sodium chloride flush  3 mL Intravenous Q12H  . sodium chloride flush  3 mL Intravenous Q12H   Continuous Infusions: . sodium chloride Stopped (08/29/16 0400)  . nitroGLYCERIN Stopped (08/29/16 0400)   PRN Meds: sodium chloride, acetaminophen, HYDROcodone-acetaminophen, nitroGLYCERIN, ondansetron (ZOFRAN) IV, oxyCODONE-acetaminophen, sodium chloride flush, zolpidem   Vital Signs    Vitals:   08/29/16 0240 08/29/16 0400 08/29/16 0623 08/29/16 0743  BP: 108/71 100/63 128/65 100/61  Pulse: 61 (!) 43 (!) 49 (!) 52  Resp: 12 16 11  (!) 9  Temp: 97.6 F (36.4 C) 97.9 F (36.6 C)  (!) 97.5 F (36.4 C)  TempSrc: Oral Oral  Oral  SpO2: 98% 95% 97% 98%  Weight: 159 lb 9.8 oz (72.4 kg)     Height:        Intake/Output Summary (Last 24 hours) at 08/29/16 0754 Last data filed at 08/29/16 0744  Gross per 24 hour  Intake          1625.68 ml  Output             3100 ml  Net         -1474.32 ml   Filed Weights   08/28/16 0902 08/28/16 1449 08/29/16 0240  Weight: 157 lb (71.2 kg) 156 lb 15.5 oz (71.2 kg) 159 lb 9.8 oz (72.4 kg)    Telemetry    Sinus brady - Personally Reviewed  ECG    Sinus brady - Personally Reviewed  Physical Exam   GEN: No acute distress.   Neck: No JVD Cardiac: Regular, bradycardic, no murmurs, rubs, or gallops.  Respiratory: Clear to auscultation bilaterally. GI: Soft, nontender, non-distended  MS: No LE edema. Right radial cath site ok.  Neuro:  Nonfocal  Psych: Normal affect   Labs     Chemistry Recent Labs Lab 08/27/16 2200 08/28/16 0502 08/28/16 1551 08/29/16 0308  NA 142 142  --  138  K 3.6 3.7  --  4.1  CL 109 109  --  106  CO2 26 24  --  26  GLUCOSE 99 84  --  100*  BUN <5* <5*  --  5*  CREATININE 0.90 0.94 0.95 0.90  CALCIUM 9.4 9.1  --  9.1  PROT  --  6.2*  --   --   ALBUMIN  --  3.9  --   --   AST  --  20  --   --   ALT  --  16*  --   --   ALKPHOS  --  69  --   --   BILITOT  --  0.8  --   --   GFRNONAA >60 >60 >60 >60  GFRAA >60 >60 >60 >60  ANIONGAP 7 9  --  6     Hematology Recent Labs Lab 08/28/16 0502 08/28/16 1551 08/29/16 0308  WBC 5.9 5.6 4.7  RBC 4.24 4.27 4.12*  HGB 13.5 13.8 13.0  HCT 40.2 41.0  39.2  MCV 94.8 96.0 95.1  MCH 31.8 32.3 31.6  MCHC 33.6 33.7 33.2  RDW 13.6 13.7 13.6  PLT 152 136* 147*    Cardiac Enzymes Recent Labs Lab 08/28/16 0502 08/28/16 0928 08/28/16 1551  TROPONINI <0.03 <0.03 <0.03    Recent Labs Lab 08/27/16 2212  TROPIPOC 0.01     BNP Recent Labs Lab 08/28/16 0502  BNP 40.4     DDimer No results for input(s): DDIMER in the last 168 hours.   Radiology    Dg Chest 2 View  Result Date: 08/27/2016 CLINICAL DATA:  Chest pain EXAM: CHEST  2 VIEW COMPARISON:  None. FINDINGS: The lungs are hyperexpanded with prominent interstitial markings. No focal consolidation or overt pulmonary edema. Cardiomediastinal contours are normal. No pleural effusion or pneumothorax. IMPRESSION: Hyperinflation suggesting COPD without acute cardiopulmonary disease. Electronically Signed   By: Deatra Robinson M.D.   On: 08/27/2016 22:33    Cardiac Studies   Cardiac cath 08/28/16:  Evidence of multiple distal right coronary stents including stent with in-stent. Patient bolus of having 16 prior stents. No evidence of stents in other locations in the distal right coronary when evaluated by cine fluoroscopy.  Distal RCA ISR, 85% within a region that has at least 2 layers of stent. Total occlusion of a left ventricular  branches supplied by left to right collaterals.  Widely patent left main.  Heavily calcified proximal LAD without significant obstruction.  Calcified eccentric 35-40% ostial circumflex.  Scoring balloon angioplasty and high pressure angioplasty of the 85% in-stent restenosis, reduced to 50%. Additional stent implantation was not performed. TIMI grade 3 flow noted post procedure.  RECOMMENDATIONS:   Medical therapy as prescribed on admission.  If he will be receiving care at [facility] in the future, we should gather as much information concerning prior procedures as possible ([facility], [facility] in [place], [facility], and possibly other facilities).  Chest discomfort experienced during PCI above was dissimilar to presenting complaint.  Coronary Diagrams   Diagnostic Diagram       Post-Intervention Diagram          Patient Profile     56 y.o. male with history of HTN and CAD with multiple prior stenting procedures who was admitted with unstable angina. Cardiac cath 08/28/16 with evidence of stents in the distal RCA but nowhere else. Chronic occlusion of small PLA branch that fills from left to right collaterals. Severe restenosis distal RCA stented segment treated with a [place]ine cutting balloon angioplasty.   Assessment & Plan    1. CAD with unstable angina: Pt found to have severe restenosis distal RCA stented segment treated with a cutting balloon angioplasty. Unable to fully expand the balloon. The stenotic segment was left at 60% stenosis. He has mild to moderate non-obstructive disease in the LAD. LVEF normal by LV gram. Will continue medical therapy with ASA/Plavix/beta blocker/statin and Norvasc. Discharge home today. Follow up in our office in 1-2 weeks with office APP or me.   Signed, Verne Carrow, MD  08/29/2016, 7:54 AM

## 2016-08-29 NOTE — Discharge Summary (Signed)
Discharge Summary    Patient ID: Connor Baker,  MRN: [id number], DOB/AGE: 06-05-1960 56 y.o.  Admit date: 08/27/2016 Discharge date: 08/29/2016  Primary Care Provider: Patient, No Pcp Per Primary Cardiologist: [facility]  Discharge Diagnoses    Principal Problem:   Chest pain with moderate risk of acute coronary syndrome Active Problems:   Hypertension, essential   CAD (coronary artery disease), native coronary artery   Hyperlipidemia   Allergies No Known Allergies  Diagnostic Studies/Procedures    LHC: 08/28/16  Conclusion    Evidence of multiple distal right coronary stents including stent with in-stent. Patient bolus of having 16 prior stents. No evidence of stents in other locations in the distal right coronary when evaluated by cine fluoroscopy.  Distal RCA ISR, 85% within a region that has at least 2 layers of stent. Total occlusion of a left ventricular branches supplied by left to right collaterals.  Widely patent left main.  Heavily calcified proximal LAD without significant obstruction.  Calcified eccentric 35-40% ostial circumflex.  Scoring balloon angioplasty and high pressure angioplasty of the 85% in-stent restenosis, reduced to 50%. Additional stent implantation was not performed. TIMI grade 3 flow noted post procedure.  RECOMMENDATIONS:   Medical therapy as prescribed on admission.  If he will be receiving care at [facility] in the future, we should gather as much information concerning prior procedures as possible ([facility], [place] in [place], [facility], and possibly other facilities).  Chest discomfort experienced during PCI above was dissimilar to presenting complaint.   Diagnostic Diagram       Post-Intervention Diagram        _____________   History of Present Illness     56 y.o. male with known history of  MI s/p multiple PCIs (records not availble), HTN presented with chest pain. CP started @ 8pm last night when he  woke up with CP , substernal, no radiation, 10/10 in intensity, pressure like sensation. Associated with nausea, diaphoresis and dyspnea. He took 2 NTG but did not resolve. On arrival given 4 NTG and 4 mg of IV morphine w/o much relief. No CHF symptoms. Wants to establish care at [facility] since has moved here. Last PCI 1 y ago at [facility] and received a stent at that time.  Angina frequency 1/ 47mo, usually takes NTG for relief. he sweeps and cleans. He is not physically limited due to angina. Per patient he had 16 stents until now and the doctors at [facility] were considering to send him for CABG but since he was changing jobs to [employer] it did not happen.   Hospital Course     Underwent LHC with Dr. Katrinka Blazing noted above with severe restenosis distal RCA stented segment treated with a cutting balloon angioplasty. Unable to fully expand the balloon. The stenotic segment was left at 60% stenosis. He has mild to moderate non-obstructive disease in the LAD. LVEF normal by LV gram. Plan to continue medical therapy with ASA/Plavix/beta blocker/statin and Norvasc. Post cath labs showed stable Cr 0.9, and Hgb 13.0. No further chest pain reported. Worked with cardiac rehab. Wants to follow up in [place] with [facility] moving forward.   He was seen by Dr. Clifton James and determined stable for discharge home. Follow up in the office has been arranged. Medications are listed below.    _____________  Discharge Vitals Blood pressure 100/61, pulse (!) 52, temperature (!) 97.5 F (36.4 C), temperature source Oral, resp. rate (!) 9, height 5\' 10"  (1.778  m), weight 159 lb 9.8 oz (72.4 kg), SpO2 98 %.  Filed Weights   08/28/16 0902 08/28/16 1449 08/29/16 0240  Weight: 157 lb (71.2 kg) 156 lb 15.5 oz (71.2 kg) 159 lb 9.8 oz (72.4 kg)    Labs & Radiologic Studies    CBC  Recent Labs  08/28/16 1551 08/29/16 0308  WBC 5.6 4.7  HGB 13.8 13.0  HCT 41.0 39.2  MCV 96.0 95.1  PLT 136* 147*   Basic  Metabolic Panel  Recent Labs  08/28/16 0502 08/28/16 1551 08/29/16 0308  NA 142  --  138  K 3.7  --  4.1  CL 109  --  106  CO2 24  --  26  GLUCOSE 84  --  100*  BUN <5*  --  5*  CREATININE 0.94 0.95 0.90  CALCIUM 9.1  --  9.1   Liver Function Tests  Recent Labs  08/28/16 0502  AST 20  ALT 16*  ALKPHOS 69  BILITOT 0.8  PROT 6.2*  ALBUMIN 3.9   No results for input(s): LIPASE, AMYLASE in the last 72 hours. Cardiac Enzymes  Recent Labs  08/28/16 0502 08/28/16 0928 08/28/16 1551  TROPONINI <0.03 <0.03 <0.03   BNP Invalid input(s): POCBNP D-Dimer No results for input(s): DDIMER in the last 72 hours. Hemoglobin A1C  Recent Labs  08/28/16 0502  HGBA1C 5.3   Fasting Lipid Panel  Recent Labs  08/28/16 0502  CHOL 125  HDL 74  LDLCALC 40  TRIG 57  CHOLHDL 1.7   Thyroid Function Tests  Recent Labs  08/28/16 0502  TSH 4.605*   _____________  Dg Chest 2 View  Result Date: 08/27/2016 CLINICAL DATA:  Chest pain EXAM: CHEST  2 VIEW COMPARISON:  None. FINDINGS: The lungs are hyperexpanded with prominent interstitial markings. No focal consolidation or overt pulmonary edema. Cardiomediastinal contours are normal. No pleural effusion or pneumothorax. IMPRESSION: Hyperinflation suggesting COPD without acute cardiopulmonary disease. Electronically Signed   By: Deatra RobinsonKevin  Herman M.D.   On: 08/27/2016 22:33   Disposition   Pt is being discharged home today in good condition.  Follow-up Plans & Appointments    Follow-up Information    Allayne ButcherSimmons, Brittainy M, PA-C Follow up on 09/08/2016.   Specialties:  Cardiology, Radiology Why:  at 11:30am for your follow up appt.  Contact information: [address] [place] [id number]          Discharge Instructions    Amb Referral to Cardiac Rehabilitation    Complete by:  As directed    Diagnosis:  PTCA   Call MD for:  redness, tenderness, or signs of infection (pain, swelling, redness,  odor or green/yellow discharge around incision site)    Complete by:  As directed    Diet - low sodium heart healthy    Complete by:  As directed    Discharge instructions    Complete by:  As directed    Radial Site Care Refer to this sheet in the next few weeks. These instructions provide you with information on caring for yourself after your procedure. Your caregiver may also give you more specific instructions. Your treatment has been planned according to current medical practices, but problems sometimes occur. Call your caregiver if you have any problems or questions after your procedure. HOME CARE INSTRUCTIONS You may shower the day after the procedure.Remove the bandage (dressing) and gently wash the site with plain soap and water.Gently pat the site dry.  Do not  apply powder or lotion to the site.  Do not submerge the affected site in water for 3 to 5 days.  Inspect the site at least twice daily.  Do not flex or bend the affected arm for 24 hours.  No lifting over 5 pounds (2.3 kg) for 5 days after your procedure.  Do not drive home if you are discharged the same day of the procedure. Have someone else drive you.  You may drive 24 hours after the procedure unless otherwise instructed by your caregiver.  What to expect: Any bruising will usually fade within 1 to 2 weeks.  Blood that collects in the tissue (hematoma) may be painful to the touch. It should usually decrease in size and tenderness within 1 to 2 weeks.  SEEK IMMEDIATE MEDICAL CARE IF: You have unusual pain at the radial site.  You have redness, warmth, swelling, or pain at the radial site.  You have drainage (other than a small amount of blood on the dressing).  You have chills.  You have a fever or persistent symptoms for more than 72 hours.  You have a fever and your symptoms suddenly get worse.  Your arm becomes pale, cool, tingly, or numb.  You have heavy bleeding from the site. Hold pressure on the site.    Increase activity slowly    Complete by:  As directed       Discharge Medications   Discharge Medication List as of 08/29/2016 12:05 PM    CONTINUE these medications which have CHANGED   Details  aspirin 81 MG tablet Take 1 tablet (81 mg total) by mouth daily., Starting Fri 08/29/2016, No Print    nitroGLYCERIN (NITROSTAT) 0.4 MG SL tablet Place 1 tablet (0.4 mg total) under the tongue every 5 (five) minutes as needed for chest pain., Starting Fri 08/29/2016, Normal      CONTINUE these medications which have NOT CHANGED   Details  amLODipine (NORVASC) 5 MG tablet Take 5 mg by mouth daily., Historical Med    atorvastatin (LIPITOR) 80 MG tablet Take 80 mg by mouth daily., Historical Med    clopidogrel (PLAVIX) 75 MG tablet Take 75 mg by mouth daily., Historical Med    isosorbide dinitrate (ISORDIL) 20 MG tablet Take 20 mg by mouth 3 (three) times daily., Historical Med    metoprolol tartrate (LOPRESSOR) 50 MG tablet Take 50 mg by mouth 2 (two) times daily., Historical Med    omeprazole (PRILOSEC) 20 MG capsule Take 20 mg by mouth daily., Historical Med         Aspirin prescribed at discharge?  Yes High Intensity Statin Prescribed? (Lipitor 40-80mg  or Crestor 20-40mg ): Yes Beta Blocker Prescribed? Yes For EF <40%, was ACEI/ARB Prescribed? No: Blood pressures soft ADP Receptor Inhibitor Prescribed? (i.e. Plavix etc.-Includes Medically Managed Patients): Yes For EF <40%, Aldosterone Inhibitor Prescribed? No: EF ok Was EF assessed during THIS hospitalization? Yes Was Cardiac Rehab II ordered? (Included Medically managed Patients): Yes   Outstanding Labs/Studies   N/A  Duration of Discharge Encounter   Greater than 30 minutes including physician time.  Signed, Laverda Page NP-C 08/29/2016, 12:59 PM

## 2016-09-08 ENCOUNTER — Encounter: Payer: Self-pay | Admitting: Cardiology

## 2016-09-08 ENCOUNTER — Ambulatory Visit (INDEPENDENT_AMBULATORY_CARE_PROVIDER_SITE_OTHER): Payer: Self-pay | Admitting: Cardiology

## 2016-09-08 VITALS — BP 114/74 | HR 63 | Ht 70.0 in | Wt 167.1 lb

## 2016-09-08 DIAGNOSIS — I25118 Atherosclerotic heart disease of native coronary artery with other forms of angina pectoris: Secondary | ICD-10-CM

## 2016-09-08 MED ORDER — ISOSORBIDE DINITRATE 30 MG PO TABS: 30.0000 mg | ORAL_TABLET | Freq: Three times a day (TID) | ORAL | 1 refills | Status: DC

## 2016-09-08 NOTE — Progress Notes (Signed)
09/08/2016 Wylan Gentzler Boettner   1960-03-31  [id number]  Primary Physician Placey, Chales Abrahams, NP Primary Cardiologist: Dr. Clifton James    Reason for Visit/CC: [facility] F/u for CAD   HPI:  Markail D Rosselli is a 56 y.o. male recently new to our practice, who presents to clinic for post hospital f/u. He has a known history of MI s/p multiple PCIs at outside hospital (records not availble), and HTN who was admitted to [facility] on 08/27/16 with complaint of CP c/w unstable angina. He was admitted and ruled out for MI with negative troponins. LDL was controlled at 40 mg/dL. Per records, the patient reported he had 16 stents until now and the doctors at [facility] were considering to send him for CABG but since he was changing jobs to [employer] it did not happen. Given the nature of his symptoms and reported history, he underwent LHC at [facility]  On 08/28/16. There was evidence of multiple distal right coronary stents including stent with in-stent. Patient reported having 16 prior stents. Per cath report, there was no evidence of stents in other locations in the distal right coronary when evaluated by cine fluoroscopy. Distal RCA ISR, 85% within a region that has at least 2 layers of stent. Total occlusion of a left ventricular branches supplied by left to right collaterals. Widely patent left main. Heavily calcified proximal LAD without significant obstruction. Calcified eccentric 35-40% ostial circumflex.  He underwent successful Scoring balloon angioplasty and high pressure angioplasty of the 85% in-stent restenosis, reduced to 50%. Additional stent implantation was not performed. LVEF was normal at 60%.   He tolerated the procedure well and was placed on DAPT with ASA and Plavix, + BB, Nitrate, CCB and statin therapy. His CP resolved. He was discharged home and now presents back for post hospital f/u.  He is currently CP free, but notes that he has had 3-4 mini episodes of recurrent SSCP, lasting less than 1 min at a  time. He has had to use PRN SL NTG and pain has resolved after 1 dose. BP is 114/74. HR is 63 bpm.    Current Meds  Medication Sig  . amLODipine (NORVASC) 5 MG tablet Take 5 mg by mouth daily.  Marland Kitchen aspirin 81 MG tablet Take 1 tablet (81 mg total) by mouth daily.  Marland Kitchen atorvastatin (LIPITOR) 80 MG tablet Take 80 mg by mouth daily.  . clopidogrel (PLAVIX) 75 MG tablet Take 75 mg by mouth daily.  . isosorbide dinitrate (ISORDIL) 20 MG tablet Take 20 mg by mouth 3 (three) times daily.  . metoprolol tartrate (LOPRESSOR) 50 MG tablet Take 50 mg by mouth 2 (two) times daily.  . nitroGLYCERIN (NITROSTAT) 0.4 MG SL tablet Place 1 tablet (0.4 mg total) under the tongue every 5 (five) minutes as needed for chest pain.  Marland Kitchen omeprazole (PRILOSEC) 20 MG capsule Take 20 mg by mouth daily.   No Known Allergies Past Medical History:  Diagnosis Date  . Arthritis    "legs" (08/28/2016)  . CHF (congestive heart failure) (HCC)   . Chronic lower back pain   . Coronary artery disease   . Depression   . GERD (gastroesophageal reflux disease)   . Headache    "monthly" (08/28/2016)  . High cholesterol   . Hypertension   . MI (myocardial infarction) (HCC) 1992; 1994; 1995  . On home oxygen therapy    "4L just at night w/my CPAP" (08/28/2016)  . OSA (obstructive sleep apnea)    w/oxygen" (08/28/2016)  .  Stroke [facility]) 2005   "mild one; faded my memory" (08/28/2016)   Family History  Problem Relation Age of Onset  . Heart attack Father    Past Surgical History:  Procedure Laterality Date  . ANKLE FRACTURE SURGERY Right 2016  . CORONARY ANGIOPLASTY    . CORONARY ANGIOPLASTY WITH STENT PLACEMENT  1992 - 2017   "I've got a total of 16 stents in me" (08/28/2016)  . CORONARY BALLOON ANGIOPLASTY Right 08/28/2016   Procedure: Coronary Balloon Angioplasty;  Surgeon: Lyn Records, MD;  Location: [facility] INVASIVE CV LAB;  Service: Cardiovascular;  Laterality: Right;  . FRACTURE SURGERY    . LEFT HEART CATH AND CORONARY ANGIOGRAPHY  N/A 08/28/2016   Procedure: Left Heart Cath and Coronary Angiography;  Surgeon: Lyn Records, MD;  Location: [facility] INVASIVE CV LAB;  Service: Cardiovascular;  Laterality: N/A;  . TOTAL HIP ARTHROPLASTY Right 1990   Social History   Social History  . Marital status: Single    Spouse name: N/A  . Number of children: N/A  . Years of education: N/A   Occupational History  . Not on file.   Social History Main Topics  . Smoking status: Former Smoker    Packs/day: 0.50    Years: 4.00    Types: Cigarettes  . Smokeless tobacco: Never Used     Comment: "quit smoking in the 1990s"  . Alcohol use Yes     Comment: 08/28/2016 "stopped 10-15 yr ago"  . Drug use: No  . Sexual activity: Not Currently   Other Topics Concern  . Not on file   Social History Narrative  . No narrative on file     Review of Systems: General: negative for chills, fever, night sweats or weight changes.  Cardiovascular: negative for chest pain, dyspnea on exertion, edema, orthopnea, palpitations, paroxysmal nocturnal dyspnea or shortness of breath Dermatological: negative for rash Respiratory: negative for cough or wheezing Urologic: negative for hematuria Abdominal: negative for nausea, vomiting, diarrhea, bright red blood per rectum, melena, or hematemesis Neurologic: negative for visual changes, syncope, or dizziness All other systems reviewed and are otherwise negative except as noted above.   Physical Exam:  Blood pressure 114/74, pulse 63, height 5\' 10"  (1.778 m), weight 167 lb 1.9 oz (75.8 kg).  General appearance: alert, cooperative and no distress Neck: no carotid bruit and no JVD Lungs: clear to auscultation bilaterally Heart: regular rate and rhythm, S1, S2 normal, no murmur, click, rub or gallop Extremities: extremities normal, atraumatic, no cyanosis or edema Pulses: 2+ and symmetric Skin: Skin color, texture, turgor normal. No rashes or lesions Neurologic: Grossly normal  EKG not preformed  --  personally reviewed   LHC 08/28/16 Procedures   Coronary Balloon Angioplasty  Left Heart Cath and Coronary Angiography  Conclusion    Evidence of multiple distal right coronary stents including stent with in-stent. Patient bolus of having 16 prior stents. No evidence of stents in other locations in the distal right coronary when evaluated by cine fluoroscopy.  Distal RCA ISR, 85% within a region that has at least 2 layers of stent. Total occlusion of a left ventricular branches supplied by left to right collaterals.  Widely patent left main.  Heavily calcified proximal LAD without significant obstruction.  Calcified eccentric 35-40% ostial circumflex.  Scoring balloon angioplasty and high pressure angioplasty of the 85% in-stent restenosis, reduced to 50%. Additional stent implantation was not performed. TIMI grade 3 flow noted post procedure.   Diagnostic Diagram  Post-Intervention Diagram          ASSESSMENT AND PLAN:   1. CAD: Prior h/o PCIs at outside hospitals as outlined above. [facility] with findings outlined above. S/p cutting balloon angioplasty of distal RCA. Unable to fully expand the balloon. The stenotic segment was left at 60% stenosis. He has mild to moderate non-obstructive disease in the LAD. LVEF normal by LV gram. Plan to continue medical therapy withASA/Plavix/beta blocker/statin and Norvasc. Still with recurrent intermittent CP, relieved with SL NTG x 1. Currently CP free. BP stable. HR in the low 60s. Not enough room to increase BB. We will increase Isordil to 30 mg TID. Continue metoprolol and amlodipine, ASA, Plavix and statin.   2. H/o HLD: controlled with statin therapy. Recent lipid panel showed LDL at 40 mg/dL.   PLAN  F/u with APP in 3-4 weeks to reassess response to isordil increase. F/u with Dr. Clifton JamesMcAlhany in 6-8 weeks.     Brittainy Delmer IslamSimmons PA-C, MHS [facility] 09/08/2016 12:13 PM

## 2016-09-08 NOTE — Patient Instructions (Addendum)
Medication Instructions:  Your physician has recommended you make the following change in your medication:  INCREASE - Isosorbide dinitrate (ISORDIL) to 30 mg by mouth three (3) times daily.   Labwork: None ordered  Testing/Procedures: None ordered  Follow-Up:D Your physician wants you to follow-up in 4 week with  Dr. Clifton JamesMcAlhany or Robbie LisBrittainy Simmons, PA-C.   Any Other Special Instructions Will Be Listed Below (If Applicable).     If you need a refill on your cardiac medications before your next appointment, please call your pharmacy.

## 2016-09-22 ENCOUNTER — Emergency Department (HOSPITAL_COMMUNITY): Payer: Medicaid Other

## 2016-09-22 ENCOUNTER — Other Ambulatory Visit: Payer: Self-pay

## 2016-09-22 ENCOUNTER — Inpatient Hospital Stay (HOSPITAL_COMMUNITY)
Admission: EM | Admit: 2016-09-22 | Discharge: 2016-09-24 | DRG: 313 | Disposition: A | Discharge status: Home / Self Care | Payer: Medicaid Other | Attending: Nephrology | Admitting: Nephrology

## 2016-09-22 ENCOUNTER — Encounter (HOSPITAL_COMMUNITY): Payer: Self-pay | Admitting: Emergency Medicine

## 2016-09-22 DIAGNOSIS — Z7982 Long term (current) use of aspirin: Secondary | ICD-10-CM

## 2016-09-22 DIAGNOSIS — I2510 Atherosclerotic heart disease of native coronary artery without angina pectoris: Secondary | ICD-10-CM | POA: Diagnosis present

## 2016-09-22 DIAGNOSIS — E785 Hyperlipidemia, unspecified: Secondary | ICD-10-CM | POA: Diagnosis present

## 2016-09-22 DIAGNOSIS — G4733 Obstructive sleep apnea (adult) (pediatric): Secondary | ICD-10-CM | POA: Diagnosis present

## 2016-09-22 DIAGNOSIS — Z96641 Presence of right artificial hip joint: Secondary | ICD-10-CM | POA: Diagnosis present

## 2016-09-22 DIAGNOSIS — Z79899 Other long term (current) drug therapy: Secondary | ICD-10-CM

## 2016-09-22 DIAGNOSIS — Z7902 Long term (current) use of antithrombotics/antiplatelets: Secondary | ICD-10-CM

## 2016-09-22 DIAGNOSIS — R001 Bradycardia, unspecified: Secondary | ICD-10-CM | POA: Diagnosis present

## 2016-09-22 DIAGNOSIS — G8929 Other chronic pain: Secondary | ICD-10-CM | POA: Diagnosis present

## 2016-09-22 DIAGNOSIS — R079 Chest pain, unspecified: Secondary | ICD-10-CM

## 2016-09-22 DIAGNOSIS — Z955 Presence of coronary angioplasty implant and graft: Secondary | ICD-10-CM

## 2016-09-22 DIAGNOSIS — R0789 Other chest pain: Principal | ICD-10-CM | POA: Diagnosis present

## 2016-09-22 DIAGNOSIS — Z8249 Family history of ischemic heart disease and other diseases of the circulatory system: Secondary | ICD-10-CM

## 2016-09-22 DIAGNOSIS — F329 Major depressive disorder, single episode, unspecified: Secondary | ICD-10-CM | POA: Diagnosis present

## 2016-09-22 DIAGNOSIS — I252 Old myocardial infarction: Secondary | ICD-10-CM

## 2016-09-22 DIAGNOSIS — I10 Essential (primary) hypertension: Secondary | ICD-10-CM | POA: Diagnosis present

## 2016-09-22 DIAGNOSIS — Z9981 Dependence on supplemental oxygen: Secondary | ICD-10-CM

## 2016-09-22 DIAGNOSIS — Z87891 Personal history of nicotine dependence: Secondary | ICD-10-CM

## 2016-09-22 DIAGNOSIS — E7800 Pure hypercholesterolemia, unspecified: Secondary | ICD-10-CM | POA: Diagnosis present

## 2016-09-22 DIAGNOSIS — Z8673 Personal history of transient ischemic attack (TIA), and cerebral infarction without residual deficits: Secondary | ICD-10-CM

## 2016-09-22 DIAGNOSIS — R413 Other amnesia: Secondary | ICD-10-CM

## 2016-09-22 DIAGNOSIS — K219 Gastro-esophageal reflux disease without esophagitis: Secondary | ICD-10-CM | POA: Diagnosis present

## 2016-09-22 DIAGNOSIS — M545 Low back pain: Secondary | ICD-10-CM | POA: Diagnosis present

## 2016-09-22 LAB — BASIC METABOLIC PANEL
Anion gap: 6 (ref 5–15)
BUN: 5 mg/dL — AB (ref 6–20)
CALCIUM: 9 mg/dL (ref 8.9–10.3)
CO2: 28 mmol/L (ref 22–32)
CREATININE: 0.89 mg/dL (ref 0.61–1.24)
Chloride: 107 mmol/L (ref 101–111)
GFR calc non Af Amer: >60 mL/min (ref >60)
Glucose, Bld: 105 mg/dL — ABNORMAL HIGH (ref 65–99)
Potassium: 3.6 mmol/L (ref 3.5–5.1)
SODIUM: 141 mmol/L (ref 135–145)

## 2016-09-22 LAB — CBC
HCT: 38.1 % — ABNORMAL LOW (ref 39.0–52.0)
Hemoglobin: 13.2 g/dL (ref 13.0–17.0)
MCH: 31.5 pg (ref 26.0–34.0)
MCHC: 34.6 g/dL (ref 30.0–36.0)
MCV: 90.9 fL (ref 78.0–100.0)
PLATELETS: 158 10*3/uL (ref 150–400)
RBC: 4.19 MIL/uL — AB (ref 4.22–5.81)
RDW: 13 % (ref 11.5–15.5)
WBC: 4.9 10*3/uL (ref 4.0–10.5)

## 2016-09-22 LAB — I-STAT TROPONIN, ED: TROPONIN I, POC: 0.01 ng/mL (ref 0.00–0.08)

## 2016-09-22 MED ORDER — MORPHINE SULFATE (PF) 4 MG/ML IV SOLN
4.0000 mg | Freq: Once | INTRAVENOUS | Status: AC
  Administered 2016-09-23: 4 mg via INTRAVENOUS
  Filled 2016-09-22: qty 1

## 2016-09-22 MED ORDER — MORPHINE SULFATE (PF) 4 MG/ML IV SOLN
4.0000 mg | Freq: Once | INTRAVENOUS | Status: AC
  Administered 2016-09-22: 4 mg via INTRAVENOUS
  Filled 2016-09-22: qty 1

## 2016-09-22 NOTE — ED Notes (Signed)
Patient transported to X-ray 

## 2016-09-22 NOTE — ED Provider Notes (Signed)
[facility]-EMERGENCY DEPT Provider Note   CSN: [id number] Arrival date & time: 09/22/16  2158     History   Chief Complaint Chief Complaint  Patient presents with  . Chest Pain    HPI Jessie D Henne is a 56 y.o. male.  Patient presents to the ED with a chief complaint of chest pain.  He has a history of prior MI and has multiple previous stents.  He was admitted and had LHC on 08/28/16.  Medical therapy was advised at that time.  He states that he had been doing well until today.  States that today after lunch he began having CP that radiates to his right shoulder and left arm.  States that he has some SOB and some dizziness as well, which is worsened when he stands up.  He has taken aspirin and nitroglycerin with no relief.  He states that his chest pressure is still a 7/10.  Nothing makes it better or worse.  It has been intermittent today and comes on without any provocation.    The history is provided by the patient. No language interpreter was used.    Past Medical History:  Diagnosis Date  . Arthritis    "legs" (08/28/2016)  . CHF (congestive heart failure) (HCC)   . Chronic lower back pain   . Coronary artery disease   . Depression   . GERD (gastroesophageal reflux disease)   . Headache    "monthly" (08/28/2016)  . High cholesterol   . Hypertension   . MI (myocardial infarction) (HCC) 1992; 1994; 1995  . On home oxygen therapy    "4L just at night w/my CPAP" (08/28/2016)  . OSA (obstructive sleep apnea)    w/oxygen" (08/28/2016)  . Stroke [facility](HCC) 2005   "mild one; faded my memory" (08/28/2016)    Patient Active Problem List   Diagnosis Date Noted  . Hyperlipidemia 08/29/2016  . CAD (coronary artery disease), native coronary artery 08/28/2016  . Hypertension, essential   . Chest pain with moderate risk of acute coronary syndrome 08/27/2016    Past Surgical History:  Procedure Laterality Date  . ANKLE FRACTURE SURGERY Right 2016  . CORONARY ANGIOPLASTY    . CORONARY  ANGIOPLASTY WITH STENT PLACEMENT  1992 - 2017   "I've got a total of 16 stents in me" (08/28/2016)  . CORONARY BALLOON ANGIOPLASTY Right 08/28/2016   Procedure: Coronary Balloon Angioplasty;  Surgeon: Lyn RecordsSmith, Henry W, MD;  [facility];  Service: Cardiovascular;  Laterality: Right;  . FRACTURE SURGERY    . LEFT HEART CATH AND CORONARY ANGIOGRAPHY N/A 08/28/2016   Procedure: Left Heart Cath and Coronary Angiography;  Surgeon: Lyn RecordsSmith, Henry W, MD;  [facility];  Service: Cardiovascular;  Laterality: N/A;  . TOTAL HIP ARTHROPLASTY Right 1990       Home Medications    Prior to Admission medications   Medication Sig Start Date End Date Taking? Authorizing Provider  amLODipine (NORVASC) 5 MG tablet Take 5 mg by mouth daily.    [provider]  aspirin 81 MG tablet Take 1 tablet (81 mg total) by mouth daily. 08/29/16   Arty Baumgartneroberts, Lindsay B, NP  atorvastatin (LIPITOR) 80 MG tablet Take 80 mg by mouth daily.    [provider]  clopidogrel (PLAVIX) 75 MG tablet Take 75 mg by mouth daily.    [provider]  isosorbide dinitrate (ISORDIL) 30 MG tablet Take 1 tablet (30 mg total) by mouth 3 (three) times daily. 09/08/16  Robbie LisSimmons, Brittainy M, PA-C  metoprolol tartrate (LOPRESSOR) 50 MG tablet Take 50 mg by mouth 2 (two) times daily.    [provider]  nitroGLYCERIN (NITROSTAT) 0.4 MG SL tablet Place 1 tablet (0.4 mg total) under the tongue every 5 (five) minutes as needed for chest pain. 08/29/16   Arty Baumgartneroberts, Lindsay B, NP  omeprazole (PRILOSEC) 20 MG capsule Take 20 mg by mouth daily.    [provider]    Family History Family History  Problem Relation Age of Onset  . Heart attack Father     Social History Social History  Substance Use Topics  . Smoking status: Former Smoker    Packs/day: 0.50    Years: 4.00    Types: Cigarettes  . Smokeless tobacco: Never Used     Comment: "quit smoking in the 1990s"  . Alcohol use Yes       Comment: 08/28/2016 "stopped 10-15 yr ago"     Allergies   Patient has no known allergies.   Review of Systems Review of Systems  All other systems reviewed and are negative.    Physical Exam Updated Vital Signs There were no vitals taken for this visit.  Physical Exam  Constitutional: He is oriented to person, place, and time. He appears well-developed and well-nourished.  HENT:  Head: Normocephalic and atraumatic.  Eyes: Pupils are equal, round, and reactive to light. Conjunctivae and EOM are normal. Right eye exhibits no discharge. Left eye exhibits no discharge. No scleral icterus.  Neck: Normal range of motion. Neck supple. No JVD present.  Cardiovascular: Normal rate, regular rhythm and normal heart sounds.  Exam reveals no gallop and no friction rub.   No murmur heard. Pulmonary/Chest: Effort normal and breath sounds normal. No respiratory distress. He has no wheezes. He has no rales. He exhibits no tenderness.  Abdominal: Soft. He exhibits no distension and no mass. There is no tenderness. There is no rebound and no guarding.  Musculoskeletal: Normal range of motion. He exhibits no edema or tenderness.  Neurological: He is alert and oriented to person, place, and time.  Skin: Skin is warm and dry.  Psychiatric: He has a normal mood and affect. His behavior is normal. Judgment and thought content normal.  Nursing note and vitals reviewed.    ED Treatments / Results  Labs (all labs ordered are listed, but only abnormal results are displayed) Labs Reviewed  BASIC METABOLIC PANEL - Abnormal; Notable for the following:       Result Value   Glucose, Bld 105 (*)    BUN 5 (*)    All other components within normal limits  CBC - Abnormal; Notable for the following:    RBC 4.19 (*)    HCT 38.1 (*)    All other components within normal limits  I-STAT TROPONIN, ED    EKG  EKG Interpretation None       Radiology Dg Chest 2 View  Result Date:  09/22/2016 CLINICAL DATA:  Mid chest pain with left-sided back pain and scapular pain. Weakness, left-sided numbness, and headache. Former smoker. EXAM: CHEST  2 VIEW COMPARISON:  08/27/2016 FINDINGS: The heart size and mediastinal contours are within normal limits. Both lungs are clear. The visualized skeletal structures are unremarkable. IMPRESSION: No active cardiopulmonary disease. Electronically Signed   By: Burman NievesWilliam  Stevens M.D.   On: 09/22/2016 22:24    Procedures Procedures (including critical care time)  Medications Ordered in ED Medications  morphine 4 MG/ML injection 4 mg (not administered)  Initial Impression / Assessment and Plan / ED Course  I have reviewed the triage vital signs and the nursing notes.  Pertinent labs & imaging results that were available during my care of the patient were reviewed by me and considered in my medical decision making (see chart for details).     Patient with known CAD, prior MIs, multiple stents.  CP started today.  No provoking factors.  Associate SOB and radiating pain.  Will check labs, CXR, and EKG.  Given risk factors, will likely need observation.  Patient discussed with Dr. Clayborne Dana, who agrees with plan for observation admission based on patient's history and risk factors.  Appreciate Dr. Toniann Fail for admitting the patient.  Final Clinical Impressions(s) / ED Diagnoses   Final diagnoses:  Chest pain, unspecified type    New Prescriptions New Prescriptions   No medications on file     Roxy Horseman, Cordelia Poche 09/22/16 2359    Mesner, Barbara Cower, MD 09/23/16 [phone]

## 2016-09-22 NOTE — ED Triage Notes (Signed)
Pt BIB [facility] for chest pain. Pt resides at a halfway house and was recently stented. Chest pain has been all day that did not respond to nitro. EMS administered 1 nitro and patient took 325 ASA at home

## 2016-09-23 ENCOUNTER — Observation Stay (HOSPITAL_COMMUNITY): Payer: Medicaid Other

## 2016-09-23 ENCOUNTER — Encounter (HOSPITAL_COMMUNITY): Payer: Self-pay | Admitting: Internal Medicine

## 2016-09-23 DIAGNOSIS — I25118 Atherosclerotic heart disease of native coronary artery with other forms of angina pectoris: Secondary | ICD-10-CM

## 2016-09-23 DIAGNOSIS — I10 Essential (primary) hypertension: Secondary | ICD-10-CM

## 2016-09-23 DIAGNOSIS — R413 Other amnesia: Secondary | ICD-10-CM

## 2016-09-23 DIAGNOSIS — E782 Mixed hyperlipidemia: Secondary | ICD-10-CM

## 2016-09-23 LAB — TROPONIN I
Troponin I: <0.03 ng/mL (ref <0.03)
Troponin I: <0.03 ng/mL (ref <0.03)

## 2016-09-23 LAB — CBC
HEMATOCRIT: 37.6 % — AB (ref 39.0–52.0)
HEMOGLOBIN: 13 g/dL (ref 13.0–17.0)
MCH: 31.6 pg (ref 26.0–34.0)
MCHC: 34.6 g/dL (ref 30.0–36.0)
MCV: 91.3 fL (ref 78.0–100.0)
Platelets: 160 10*3/uL (ref 150–400)
RBC: 4.12 MIL/uL — AB (ref 4.22–5.81)
RDW: 12.9 % (ref 11.5–15.5)
WBC: 4.9 10*3/uL (ref 4.0–10.5)

## 2016-09-23 LAB — CREATININE, SERUM: Creatinine, Ser: 0.85 mg/dL (ref 0.61–1.24)

## 2016-09-23 MED ORDER — PANTOPRAZOLE SODIUM 40 MG PO TBEC
40.0000 mg | DELAYED_RELEASE_TABLET | Freq: Every day | ORAL | Status: DC
Start: 2016-09-23 — End: 2016-09-24
  Administered 2016-09-23 – 2016-09-24 (×2): 40 mg via ORAL
  Filled 2016-09-23 (×2): qty 1

## 2016-09-23 MED ORDER — CLOPIDOGREL BISULFATE 75 MG PO TABS
75.0000 mg | ORAL_TABLET | Freq: Every day | ORAL | Status: DC
  Administered 2016-09-23 – 2016-09-24 (×2): 75 mg via ORAL
  Filled 2016-09-23 (×2): qty 1

## 2016-09-23 MED ORDER — ISOSORBIDE DINITRATE 10 MG PO TABS
30.0000 mg | ORAL_TABLET | Freq: Three times a day (TID) | ORAL | Status: DC
  Administered 2016-09-23 – 2016-09-24 (×4): 30 mg via ORAL
  Filled 2016-09-23: qty 3
  Filled 2016-09-23: qty 1
  Filled 2016-09-23: qty 3
  Filled 2016-09-23: qty 1
  Filled 2016-09-23 (×2): qty 3

## 2016-09-23 MED ORDER — ZOLPIDEM TARTRATE 5 MG PO TABS
5.0000 mg | ORAL_TABLET | Freq: Every evening | ORAL | Status: DC | PRN
  Administered 2016-09-23: 5 mg via ORAL
  Filled 2016-09-23: qty 1

## 2016-09-23 MED ORDER — ONDANSETRON HCL 4 MG/2ML IJ SOLN: 4.0000 mg | Freq: Four times a day (QID) | INTRAMUSCULAR | Status: DC | PRN

## 2016-09-23 MED ORDER — NITROGLYCERIN 0.4 MG SL SUBL: 0.4000 mg | SUBLINGUAL_TABLET | SUBLINGUAL | Status: DC | PRN

## 2016-09-23 MED ORDER — ENOXAPARIN SODIUM 40 MG/0.4ML ~~LOC~~ SOLN
40.0000 mg | SUBCUTANEOUS | Status: DC
  Administered 2016-09-23 – 2016-09-24 (×2): 40 mg via SUBCUTANEOUS
  Filled 2016-09-23 (×2): qty 0.4

## 2016-09-23 MED ORDER — ACETAMINOPHEN 325 MG PO TABS
650.0000 mg | ORAL_TABLET | ORAL | Status: DC | PRN
  Administered 2016-09-23: 650 mg via ORAL
  Filled 2016-09-23: qty 2

## 2016-09-23 MED ORDER — ASPIRIN EC 325 MG PO TBEC
325.0000 mg | DELAYED_RELEASE_TABLET | Freq: Every day | ORAL | Status: DC
  Administered 2016-09-23 – 2016-09-24 (×2): 325 mg via ORAL
  Filled 2016-09-23 (×2): qty 1

## 2016-09-23 MED ORDER — METOPROLOL TARTRATE 50 MG PO TABS
50.0000 mg | ORAL_TABLET | Freq: Two times a day (BID) | ORAL | Status: DC
  Administered 2016-09-23 – 2016-09-24 (×2): 50 mg via ORAL
  Filled 2016-09-23 (×2): qty 1

## 2016-09-23 MED ORDER — ATORVASTATIN CALCIUM 80 MG PO TABS
80.0000 mg | ORAL_TABLET | Freq: Every day | ORAL | Status: DC
  Administered 2016-09-23: 80 mg via ORAL
  Filled 2016-09-23 (×2): qty 1

## 2016-09-23 MED ORDER — AMLODIPINE BESYLATE 5 MG PO TABS
5.0000 mg | ORAL_TABLET | Freq: Every day | ORAL | Status: DC
  Administered 2016-09-23 – 2016-09-24 (×2): 5 mg via ORAL
  Filled 2016-09-23 (×2): qty 1

## 2016-09-23 MED ORDER — MORPHINE SULFATE (PF) 2 MG/ML IV SOLN
2.0000 mg | INTRAVENOUS | Status: DC | PRN
  Administered 2016-09-23 – 2016-09-24 (×3): 2 mg via INTRAVENOUS
  Filled 2016-09-23 (×3): qty 1

## 2016-09-23 NOTE — H&P (Signed)
History and Physical    Connor Baker WUJ:[id number] DOB: [birth year] DOA: 09/22/2016  PCP: Lavinia Sharps, NP  Patient coming from: Home.  Chief Complaint: Chest pain.  HPI: Connor Baker is a 56 y.o. male with history of CAD status post multiple stent and has had recent cardiac cath in 08/28/2016 presented with sudden onset of chest pain after lunch yesterday. Pain was retrosternal pressure like radiating to both arms with diaphoresis and shortness of breath. Patient tried sublingual nitroglycerin despite which patient's pain was still persistent and came to the ER.   ED Course: The ER chest x-ray EKG and cardiac markers were unremarkable. EKG did show sinus bradycardia. Patient's chest pain improved with sublingual nitroglycerin and morphine and admitted for further management of chest pain.  Review of Systems: As per HPI, rest all negative.   Past Medical History:  Diagnosis Date  . Arthritis    "legs" (08/28/2016)  . CHF (congestive heart failure) (HCC)   . Chronic lower back pain   . Coronary artery disease   . Depression   . GERD (gastroesophageal reflux disease)   . Headache    "monthly" (08/28/2016)  . High cholesterol   . Hypertension   . MI (myocardial infarction) (HCC) 1992; 1994; 1995  . On home oxygen therapy    "4L just at night w/my CPAP" (08/28/2016)  . OSA (obstructive sleep apnea)    w/oxygen" (08/28/2016)  . Stroke [facility]) 2005   "mild one; faded my memory" (08/28/2016)    Past Surgical History:  Procedure Laterality Date  . ANKLE FRACTURE SURGERY Right 2016  . CORONARY ANGIOPLASTY    . CORONARY ANGIOPLASTY WITH STENT PLACEMENT  1992 - 2017   "I've got a total of 16 stents in me" (08/28/2016)  . CORONARY BALLOON ANGIOPLASTY Right 08/28/2016   Procedure: Coronary Balloon Angioplasty;  Surgeon: Lyn Records, MD;  Location: [facility] INVASIVE CV LAB;  Service: Cardiovascular;  Laterality: Right;  . FRACTURE SURGERY    . LEFT HEART CATH AND CORONARY ANGIOGRAPHY N/A  08/28/2016   Procedure: Left Heart Cath and Coronary Angiography;  Surgeon: Lyn Records, MD;  Location: [facility] INVASIVE CV LAB;  Service: Cardiovascular;  Laterality: N/A;  . TOTAL HIP ARTHROPLASTY Right 1990     reports that he has quit smoking. His smoking use included Cigarettes. He has a 2.00 pack-year smoking history. He has never used smokeless tobacco. He reports that he drinks alcohol. He reports that he does not use drugs.  No Known Allergies  Family History  Problem Relation Age of Onset  . Heart attack Father     Prior to Admission medications   Medication Sig Start Date End Date Taking? Authorizing Provider  amLODipine (NORVASC) 5 MG tablet Take 5 mg by mouth daily.   Yes [provider]  aspirin EC 325 MG tablet Take 325 mg by mouth daily.   Yes [provider]  atorvastatin (LIPITOR) 80 MG tablet Take 80 mg by mouth daily.   Yes [provider]  clopidogrel (PLAVIX) 75 MG tablet Take 75 mg by mouth daily.   Yes [provider]  isosorbide dinitrate (ISORDIL) 30 MG tablet Take 1 tablet (30 mg total) by mouth 3 (three) times daily. 09/08/16  Yes Robbie Lis M, PA-C  metoprolol tartrate (LOPRESSOR) 50 MG tablet Take 50 mg by mouth 2 (two) times daily.   Yes [provider]  nitroGLYCERIN (NITROSTAT) 0.4 MG SL tablet Place 1 tablet (0.4 mg total) under the  tongue every 5 (five) minutes as needed for chest pain. 08/29/16  Yes Arty Baumgartneroberts, Lindsay B, NP  omeprazole (PRILOSEC) 20 MG capsule Take 20 mg by mouth daily.   Yes [provider]  aspirin 81 MG tablet Take 1 tablet (81 mg total) by mouth daily. Patient not taking: Reported on 09/22/2016 08/29/16   Arty Baumgartneroberts, Lindsay B, NP    Physical Exam: Vitals:   09/22/16 2311 09/22/16 2345  BP: (!) 122/91 (!) 134/101  Pulse: (!) 52 (!) 58  Resp: 12 14  SpO2: 99% 100%      Constitutional: Moderately built and nourished. Vitals:   09/22/16 2311 09/22/16 2345  BP: (!) 122/91 (!)  134/101  Pulse: (!) 52 (!) 58  Resp: 12 14  SpO2: 99% 100%   Eyes: Anicteric. No pallor. ENMT: No discharge from the ears eyes nose or mouth. Neck: No JVD appreciated no mass felt. Respiratory: No rhonchi or crepitations. Cardiovascular: S1-S2 heard no murmurs appreciated. Abdomen: Soft nontender bowel sounds present. No guarding or rigidity. Musculoskeletal: No edema. No joint effusion. Skin: No rash. Skin appears warm. Neurologic: Alert awake oriented to time place and person. Moves all extremities. Psychiatric: Appears normal. Normal affect.   Labs on Admission: I have personally reviewed following labs and imaging studies  CBC:  Recent Labs Lab 09/22/16 2224  WBC 4.9  HGB 13.2  HCT 38.1*  MCV 90.9  PLT 158   Basic Metabolic Panel:  Recent Labs Lab 09/22/16 2224  NA 141  K 3.6  CL 107  CO2 28  GLUCOSE 105*  BUN 5*  CREATININE 0.89  CALCIUM 9.0   GFR: CrCl cannot be calculated (Unknown ideal weight.). Liver Function Tests: No results for input(s): AST, ALT, ALKPHOS, BILITOT, PROT, ALBUMIN in the last 168 hours. No results for input(s): LIPASE, AMYLASE in the last 168 hours. No results for input(s): AMMONIA in the last 168 hours. Coagulation Profile: No results for input(s): INR, PROTIME in the last 168 hours. Cardiac Enzymes: No results for input(s): CKTOTAL, CKMB, CKMBINDEX, TROPONINI in the last 168 hours. BNP (last 3 results) No results for input(s): PROBNP in the last 8760 hours. HbA1C: No results for input(s): HGBA1C in the last 72 hours. CBG: No results for input(s): GLUCAP in the last 168 hours. Lipid Profile: No results for input(s): CHOL, HDL, LDLCALC, TRIG, CHOLHDL, LDLDIRECT in the last 72 hours. Thyroid Function Tests: No results for input(s): TSH, T4TOTAL, FREET4, T3FREE, THYROIDAB in the last 72 hours. Anemia Panel: No results for input(s): VITAMINB12, FOLATE, FERRITIN, TIBC, IRON, RETICCTPCT in the last 72 hours. Urine analysis: No  results found for: COLORURINE, APPEARANCEUR, LABSPEC, PHURINE, GLUCOSEU, HGBUR, BILIRUBINUR, KETONESUR, PROTEINUR, UROBILINOGEN, NITRITE, LEUKOCYTESUR Sepsis Labs: @LABRCNTIP (procalcitonin:4,lacticidven:4) )No results found for this or any previous visit (from the past 240 hour(s)).   Radiological Exams on Admission: Dg Chest 2 View  Result Date: 09/22/2016 CLINICAL DATA:  Mid chest pain with left-sided back pain and scapular pain. Weakness, left-sided numbness, and headache. Former smoker. EXAM: CHEST  2 VIEW COMPARISON:  08/27/2016 FINDINGS: The heart size and mediastinal contours are within normal limits. Both lungs are clear. The visualized skeletal structures are unremarkable. IMPRESSION: No active cardiopulmonary disease. Electronically Signed   By: Burman NievesWilliam  Stevens M.D.   On: 09/22/2016 22:24    EKG: Independently reviewed. Sinus bradycardia.  Assessment/Plan Principal Problem:   Chest pain Active Problems:   Hypertension, essential   Hyperlipidemia    1. Chest pain with history of CAD status post ending concerning for unstable  angina - we'll cycle cardiac markers continue with statins and antiplatelet agents beta blockers and will keep patient nothing by mouth in a.m. in anticipation of procedure. When necessary nitroglycerin and morphine. I have requested cardiology consult. 2. Hypertension on Norvasc and beta blocker. 3. Hyperlipidemia on statins.  I have reviewed patient's old charts and labs.   DVT prophylaxis: Lovenox. Code Status: Full code.  Family Communication: Discussed with patient.  Disposition Plan: Home.  Consults called: Cardiology.  Admission status: Observation.    Eduard ClosKAKRAKANDY,Gertude Benito N. MD [facility] Pager [phone].  If 7PM-7AM, please contact night-coverage [email or web address] Password [email or web address]  09/23/2016, 12:48 AM

## 2016-09-23 NOTE — ED Notes (Signed)
Pt taken to unit for bedside report. Ok per Architectfloor RN

## 2016-09-23 NOTE — Consult Note (Signed)
Cardiology Consult    Patient ID: Connor Baker MRN: [id number], DOB/AGE: 1960/07/29   Admit date: 09/22/2016 Date of Consult: 09/23/2016  Primary Physician: Connor SharpsPlacey, Mary Ann, NP Primary Cardiologist: Connor Baker  Requesting Provider: Toniann Baker  Reason for Consultation: Chest pain  Connor Baker is a 56 y.o. male who is being seen today for the evaluation of chest pain at the request of Dr. Toniann Baker.   Patient Profile    56 yo male with PMH of CAD s/p stenting of RCA with recent balloon angioplasty, HTN, HL, GERD and OSA who presented with chest pain, headache and confusion.   Past Medical History   Past Medical History:  Diagnosis Date  . Arthritis    "legs" (08/28/2016)  . CHF (congestive heart failure) (HCC)   . Chronic lower back pain   . Coronary artery disease   . Depression   . GERD (gastroesophageal reflux disease)   . Headache    "monthly" (08/28/2016)  . High cholesterol   . Hypertension   . MI (myocardial infarction) (HCC) 1992; 1994; 1995  . On home oxygen therapy    "4L just at night w/my CPAP" (08/28/2016)  . OSA (obstructive sleep apnea)    w/oxygen" (08/28/2016)  . Stroke [facility](HCC) 2005   "mild one; faded my memory" (08/28/2016)    Past Surgical History:  Procedure Laterality Date  . ANKLE FRACTURE SURGERY Right 2016  . CORONARY ANGIOPLASTY    . CORONARY ANGIOPLASTY WITH STENT PLACEMENT  1992 - 2017   "I've got a total of 16 stents in me" (08/28/2016)  . CORONARY BALLOON ANGIOPLASTY Right 08/28/2016   Procedure: Coronary Balloon Angioplasty;  Surgeon: Lyn RecordsSmith, Henry W, MD;  [facility];  Service: Cardiovascular;  Laterality: Right;  . FRACTURE SURGERY    . LEFT HEART CATH AND CORONARY ANGIOGRAPHY N/A 08/28/2016   Procedure: Left Heart Cath and Coronary Angiography;  Surgeon: Lyn RecordsSmith, Henry W, MD;  [facility];  Service: Cardiovascular;  Laterality: N/A;  . TOTAL HIP ARTHROPLASTY Right 1990     Allergies  No Known  Allergies  History of Present Illness    Connor Baker is a 56 yo male with PMH of CAD s/p recent cath with distal ISR 85% with scoring balloon angioplasty reduced to 50%, HTN, HL. He was admitted on 08/28/16 with chest pain. He was new to cardiology in [place], and reported previous history of stenting. Underwent cardiac catheterization that showed distal RCA ISR, 85% with at least 2 layers of stent. Total occlusion of a left ventricular branches supplied by left to right collaterals. Widely patent left main, with heavily calcified proximal LAD without significant obstruction. Calcified eccentric 35-40% ostial circumflex.  Had successful scoring balloon angioplasty and high pressure angioplasty of the 85% in-stent restenosis, reduced to 50%. No stenting preformed. LVEF was normal at 60%.  Discharge home on DAPT with ASA/plavix, BB, nitrate, CCB and statin.   Seen back in the office on 7/16 and reported doing well overall. Had intermittent episodes of chest pain since dc that resolved with SL nitro. His Isordil was increased to 30mg  TID. Reports since office visit he has continued to have intermittent episodes of "squeezing" in his chest. Also complains of headache, and trouble with his memory. Developed an episode of chest pressure yesterday after lunch and attempted to take nitro without relief. States it went down his left arm and left side of his body. In the ED EKG showed SR no acute changes. Trop cycled  and neg x3. CXR negative. He was admitted by hospitalist for further work up.   Inpatient Medications    . amLODipine  5 mg Oral Daily  . aspirin EC  325 mg Oral Daily  . atorvastatin  80 mg Oral q1800  . clopidogrel  75 mg Oral Daily  . enoxaparin (LOVENOX) injection  40 mg Subcutaneous Q24H  . isosorbide dinitrate  30 mg Oral TID  . metoprolol tartrate  50 mg Oral BID  . pantoprazole  40 mg Oral Daily    Family History    Family History  Problem Relation Age of Onset  . Heart attack Father      Social History    Social History   Social History  . Marital status: Single    Spouse name: N/A  . Number of children: N/A  . Years of education: N/A   Occupational History  . Not on file.   Social History Main Topics  . Smoking status: Former Smoker    Packs/day: 0.50    Years: 4.00    Types: Cigarettes  . Smokeless tobacco: Never Used     Comment: "quit smoking in the 1990s"  . Alcohol use Yes     Comment: 08/28/2016 "stopped 10-15 yr ago"  . Drug use: No  . Sexual activity: Not Currently   Other Topics Concern  . Not on file   Social History Narrative  . No narrative on file     Review of Systems    See HPI  All other systems reviewed and are otherwise negative except as noted above.  Physical Exam    Blood pressure 119/79, pulse (!) 43, resp. rate 12, SpO2 97 %.  General: Pleasant, NAD Psych: Normal affect. Neuro: Alert and oriented X 3. Moves all extremities spontaneously. HEENT: Normal  Neck: Supple without bruits or JVD. Lungs:  Resp regular and unlabored, CTA. Heart: RRR no s3, s4, or murmurs. Abdomen: Soft, non-tender, non-distended, BS + x 4.  Extremities: No clubbing, cyanosis or edema. DP/PT/Radials 2+ and equal bilaterally.  Labs    Troponin [facility] of Care Test)  Recent Labs  09/22/16 2230  TROPIPOC 0.01    Recent Labs  09/23/16 0255 09/23/16 0647  TROPONINI <0.03 <0.03   Lab Results  Component Value Date   WBC 4.9 09/23/2016   HGB 13.0 09/23/2016   HCT 37.6 (L) 09/23/2016   MCV 91.3 09/23/2016   PLT 160 09/23/2016    Recent Labs Lab 09/22/16 2224 09/23/16 0255  NA 141  --   K 3.6  --   CL 107  --   CO2 28  --   BUN 5*  --   CREATININE 0.89 0.85  CALCIUM 9.0  --   GLUCOSE 105*  --    Lab Results  Component Value Date   CHOL 125 08/28/2016   HDL 74 08/28/2016   LDLCALC 40 08/28/2016   TRIG 57 08/28/2016   No results found for: [facility]   Radiology Studies    Dg Chest 2 View  Result Date:  09/22/2016 CLINICAL DATA:  Mid chest pain with left-sided back pain and scapular pain. Weakness, left-sided numbness, and headache. Former smoker. EXAM: CHEST  2 VIEW COMPARISON:  08/27/2016 FINDINGS: The heart size and mediastinal contours are within normal limits. Both lungs are clear. The visualized skeletal structures are unremarkable. IMPRESSION: No active cardiopulmonary disease. Electronically Signed   By: Burman Nieves M.D.   On: 09/22/2016 22:24   Dg Chest 2 View  Result Date: 08/27/2016 CLINICAL DATA:  Chest pain EXAM: CHEST  2 VIEW COMPARISON:  None. FINDINGS: The lungs are hyperexpanded with prominent interstitial markings. No focal consolidation or overt pulmonary edema. Cardiomediastinal contours are normal. No pleural effusion or pneumothorax. IMPRESSION: Hyperinflation suggesting COPD without acute cardiopulmonary disease. Electronically Signed   By: Deatra RobinsonKevin  Herman M.D.   On: 08/27/2016 22:33    ECG & Cardiac Imaging    EKG: SR  Cath: 08/28/16  Conclusion    Evidence of multiple distal right coronary stents including stent with in-stent. Patient bolus of having 16 prior stents. No evidence of stents in other locations in the distal right coronary when evaluated by cine fluoroscopy.  Distal RCA ISR, 85% within a region that has at least 2 layers of stent. Total occlusion of a left ventricular branches supplied by left to right collaterals.  Widely patent left main.  Heavily calcified proximal LAD without significant obstruction.  Calcified eccentric 35-40% ostial circumflex.  Scoring balloon angioplasty and high pressure angioplasty of the 85% in-stent restenosis, reduced to 50%. Additional stent implantation was not performed. TIMI grade 3 flow noted post procedure.  RECOMMENDATIONS:   Medical therapy as prescribed on admission.  If he will be receiving [facility] at [facility] in the future, we should gather as much information concerning prior procedures as possible  ([facility], [facility] [place], [facility], and possibly other facilities).  Chest discomfort experienced during PCI above was dissimilar to presenting complaint.     Assessment & Plan    56 yo male with PMH of CAD s/p stenting of RCA with recent balloon angioplasty, HTN, HL, GERD and OSA who presented with chest pain, headache and confusion.   1. Chest pain: Reports intermittent episodes of chest squeezing over the past couple of weeks. Usually relieved by nitro, but episode yesterday was worse. Known Hx of CAD with recent cath noted above. EKG non acute. Trop negative x3. On medical therapy with BB, ASA, plavix, Isordil and statin. No room to further titrate medications as HR is in the 50/60s, and blood pressure somewhat soft.  -- review with MD regarding further work up  2. Headache/Memory issues: reports he has been having episodes where he struggles to remember things, and his mind "feels foggy". Defer to primary team  3. HTN: stable, blood pressure actually soft at this time.   4. HL: on statin therapy  Signed, Laverda PageLindsay Roberts, NP-C Pager [phone] 09/23/2016, 10:08 AM   I have examined the patient and reviewed assessment and plan and discussed with patient.  Agree with above as stated.  Vague sx reported.  Exam stable.  Patient is more worried about stroke sx.  No objective evidence of coronary ischemia.  Would not plan any cardiac testing at this time.  Will defer neuro w/u to the primary team.  Continue to cycle cardiac enzymes.  Lance MussJayadeep Silvano Garofano

## 2016-09-23 NOTE — ED Notes (Signed)
Pt is still having CP. Cardiology aware.

## 2016-09-23 NOTE — ED Notes (Signed)
Report attempted 

## 2016-09-23 NOTE — ED Notes (Signed)
Message sent to pharmacy for lovenox

## 2016-09-23 NOTE — Progress Notes (Addendum)
Patient admitted by Dr. Midge MiniumArshad Kakrakandy earlier today.    56 year old male with history of HTN, hyperlipidemia, CAD with multiple stent placements (per patient has had 16 stents) and recent cardiac there is a patient on 08/28/2016 presented with chest pain. Patient currently still having the chest pain despite nitroglycerin. EKG and cardiac markers thus far have been unremarkable. Patient noted to have some sinus bradycardia. Cardiology consulted and appreciated. Pending further recommendations. Will obtain CT head to address patient's concern about possible stroke.  Feels his memory has changed.   Time spent: 20 minutes  Bina Veenstra D.O. [facility] Pager [phone]  If 7PM-7AM, please contact night-coverage [email or web address] Password [email or web address] 09/23/2016, 11:29 AM

## 2016-09-24 DIAGNOSIS — Z9981 Dependence on supplemental oxygen: Secondary | ICD-10-CM | POA: Diagnosis not present

## 2016-09-24 DIAGNOSIS — R079 Chest pain, unspecified: Secondary | ICD-10-CM

## 2016-09-24 DIAGNOSIS — M545 Low back pain: Secondary | ICD-10-CM | POA: Diagnosis present

## 2016-09-24 DIAGNOSIS — Z96641 Presence of right artificial hip joint: Secondary | ICD-10-CM | POA: Diagnosis present

## 2016-09-24 DIAGNOSIS — Z7982 Long term (current) use of aspirin: Secondary | ICD-10-CM | POA: Diagnosis not present

## 2016-09-24 DIAGNOSIS — Z8673 Personal history of transient ischemic attack (TIA), and cerebral infarction without residual deficits: Secondary | ICD-10-CM | POA: Diagnosis not present

## 2016-09-24 DIAGNOSIS — I2510 Atherosclerotic heart disease of native coronary artery without angina pectoris: Secondary | ICD-10-CM | POA: Diagnosis present

## 2016-09-24 DIAGNOSIS — G4733 Obstructive sleep apnea (adult) (pediatric): Secondary | ICD-10-CM | POA: Diagnosis present

## 2016-09-24 DIAGNOSIS — Z7902 Long term (current) use of antithrombotics/antiplatelets: Secondary | ICD-10-CM | POA: Diagnosis not present

## 2016-09-24 DIAGNOSIS — F329 Major depressive disorder, single episode, unspecified: Secondary | ICD-10-CM | POA: Diagnosis present

## 2016-09-24 DIAGNOSIS — R0789 Other chest pain: Secondary | ICD-10-CM | POA: Diagnosis present

## 2016-09-24 DIAGNOSIS — E7800 Pure hypercholesterolemia, unspecified: Secondary | ICD-10-CM | POA: Diagnosis present

## 2016-09-24 DIAGNOSIS — I252 Old myocardial infarction: Secondary | ICD-10-CM | POA: Diagnosis not present

## 2016-09-24 DIAGNOSIS — Z79899 Other long term (current) drug therapy: Secondary | ICD-10-CM | POA: Diagnosis not present

## 2016-09-24 DIAGNOSIS — R001 Bradycardia, unspecified: Secondary | ICD-10-CM | POA: Diagnosis present

## 2016-09-24 DIAGNOSIS — E785 Hyperlipidemia, unspecified: Secondary | ICD-10-CM | POA: Diagnosis present

## 2016-09-24 DIAGNOSIS — G8929 Other chronic pain: Secondary | ICD-10-CM | POA: Diagnosis present

## 2016-09-24 DIAGNOSIS — K219 Gastro-esophageal reflux disease without esophagitis: Secondary | ICD-10-CM | POA: Diagnosis present

## 2016-09-24 DIAGNOSIS — Z955 Presence of coronary angioplasty implant and graft: Secondary | ICD-10-CM | POA: Diagnosis not present

## 2016-09-24 NOTE — Discharge Summary (Signed)
Physician Discharge Summary  Connor MorelRex D Brase NFA:[id number] DOB: May 01, 1960 DOA: 09/22/2016  PCP: Connor Baker, Mary Ann, NP  Admit date: 09/22/2016 Discharge date: 09/24/2016  Admitted From:Group home Disposition: group home  Recommendations for Outpatient Follow-up:  1. Follow up with PCP in 1-2 weeks 2. Please obtain BMP/CBC in one week  Home Health:no Equipment/Devices:no Discharge Condition:stable CODE STATUS:full code Diet recommendation:heart healthy  Brief/Interim Summary: 56 year old male with history of coronary artery disease is status post multiple stent and had recent cardiac cath on July 5 presented with chest pain. EKG and cardiac markers were unremarkable. EKG with sinus bradycardia. Patient was evaluated by cardiologist recommended to continue current medications including aspirin, Plavix, statin, Isordil and beta blocker. No further cardiac intervention recommended by cardiologist. Patient clinically improved. He reported generalized headache with mild memory issues. CT scan of head with no acute finding. Patient was recommended to follow-up with PCP. He has history of hypertension and dyslipidemia for which continued home medications. Patient is clinically stable. He will be discharged home with outpatient follow-up.  Discharge Diagnoses:  Principal Problem:   Chest pain Active Problems:   Hypertension, essential   Hyperlipidemia    Discharge Instructions  Discharge Instructions    Call MD for:  difficulty breathing, headache or visual disturbances    Complete by:  As directed    Call MD for:  extreme fatigue    Complete by:  As directed    Call MD for:  hives    Complete by:  As directed    Call MD for:  persistant dizziness or light-headedness    Complete by:  As directed    Call MD for:  persistant nausea and vomiting    Complete by:  As directed    Call MD for:  severe uncontrolled pain    Complete by:  As directed    Call MD for:  temperature >100.4     Complete by:  As directed    Diet - low sodium heart healthy    Complete by:  As directed    Increase activity slowly    Complete by:  As directed      Allergies as of 09/24/2016   No Known Allergies     Medication List    TAKE these medications   amLODipine 5 MG tablet Commonly known as:  NORVASC Take 5 mg by mouth daily.   aspirin EC 325 MG tablet Take 325 mg by mouth daily. What changed:  Another medication with the same name was removed. Continue taking this medication, and follow the directions you see here.   atorvastatin 80 MG tablet Commonly known as:  LIPITOR Take 80 mg by mouth daily.   clopidogrel 75 MG tablet Commonly known as:  PLAVIX Take 75 mg by mouth daily.   isosorbide dinitrate 30 MG tablet Commonly known as:  ISORDIL Take 1 tablet (30 mg total) by mouth 3 (three) times daily.   metoprolol tartrate 50 MG tablet Commonly known as:  LOPRESSOR Take 50 mg by mouth 2 (two) times daily.   nitroGLYCERIN 0.4 MG SL tablet Commonly known as:  NITROSTAT Place 1 tablet (0.4 mg total) under the tongue every 5 (five) minutes as needed for chest pain.   omeprazole 20 MG capsule Commonly known as:  PRILOSEC Take 20 mg by mouth daily.      Follow-up Information    Kathleene HazelMcAlhany, Christopher D, MD. Schedule an appointment as soon as possible for a visit in 2 week(s).   Specialty:  Cardiology  Contact information: [address] [place] [address] [phone]        Placey, Connor AbrahamsMary Ann, NP. Schedule an appointment as soon as possible for a visit in 1 week(s).   Contact information: [address] [place] [address] [phone]          No Known Allergies  Consultations: Cardiology  Procedures/Studies: None  Subjective: Seen and examined at bedside. Denied headache, dizziness, nausea vomiting chest pain shortness of breath. He verbalized understanding of follow-up instructions.  Discharge Exam: Vitals:   09/24/16 0500  09/24/16 0916  BP: 104/60 119/78  Pulse: (!) 49 (!) 59  Resp:    Temp: (!) 97.5 F (36.4 C)    Vitals:   09/23/16 1400 09/23/16 2115 09/24/16 0500 09/24/16 0916  BP: 121/86 111/75 104/60 119/78  Pulse: (!) 58 70 (!) 49 (!) 59  Resp: 16 18    Temp: 97.7 F (36.5 C) 97.8 F (36.6 C) (!) 97.5 F (36.4 C)   TempSrc: Oral Oral Oral   SpO2: 99% 97% 99%   Weight: 74 kg (163 lb 1.6 oz)  73.4 kg (161 lb 14.4 oz)   Height: 5\' 10"  (1.778 m)       General: Pt is alert, awake, not in acute distress Cardiovascular: RRR, S1/S2 +, no rubs, no gallops Respiratory: CTA bilaterally, no wheezing, no rhonchi Abdominal: Soft, NT, ND, bowel sounds + Extremities: no edema, no cyanosis    The results of significant diagnostics from this hospitalization (including imaging, microbiology, ancillary and laboratory) are listed below for reference.     Microbiology: No results found for this or any previous visit (from the past 240 hour(s)).   Labs: BNP (last 3 results)  Recent Labs  08/28/16 0502  BNP 40.4   Basic Metabolic Panel:  Recent Labs Lab 09/22/16 2224 09/23/16 0255  NA 141  --   K 3.6  --   CL 107  --   CO2 28  --   GLUCOSE 105*  --   BUN 5*  --   CREATININE 0.89 0.85  CALCIUM 9.0  --    Liver Function Tests: No results for input(s): AST, ALT, ALKPHOS, BILITOT, PROT, ALBUMIN in the last 168 hours. No results for input(s): LIPASE, AMYLASE in the last 168 hours. No results for input(s): AMMONIA in the last 168 hours. CBC:  Recent Labs Lab 09/22/16 2224 09/23/16 0255  WBC 4.9 4.9  HGB 13.2 13.0  HCT 38.1* 37.6*  MCV 90.9 91.3  PLT 158 160   Cardiac Enzymes:  Recent Labs Lab 09/23/16 0255 09/23/16 0647 09/23/16 1425  TROPONINI <0.03 <0.03 <0.03   BNP: Invalid input(s): POCBNP CBG: No results for input(s): GLUCAP in the last 168 hours. D-Dimer No results for input(s): DDIMER in the last 72 hours. Hgb A1c No results for input(s): HGBA1C in the last 72  hours. Lipid Profile No results for input(s): CHOL, HDL, LDLCALC, TRIG, CHOLHDL, LDLDIRECT in the last 72 hours. Thyroid function studies No results for input(s): TSH, T4TOTAL, T3FREE, THYROIDAB in the last 72 hours.  Invalid input(s): FREET3 Anemia work up No results for input(s): VITAMINB12, FOLATE, FERRITIN, TIBC, IRON, RETICCTPCT in the last 72 hours. Urinalysis No results found for: COLORURINE, APPEARANCEUR, LABSPEC, PHURINE, GLUCOSEU, HGBUR, BILIRUBINUR, KETONESUR, PROTEINUR, UROBILINOGEN, NITRITE, LEUKOCYTESUR Sepsis Labs Invalid input(s): PROCALCITONIN,  WBC,  LACTICIDVEN Microbiology No results found for this or any previous visit (from the past 240 hour(s)).   Time coordinating discharge: 28 minutes  SIGNED:  Nahsir Venezia Jaynie Collins, MD  [facility] 09/24/2016, 11:08 AM  If 7PM-7AM, please contact night-coverage [email or web address] Password [email or web address]

## 2016-09-24 NOTE — Progress Notes (Signed)
CSW consulted as patient is from halfway house, [facility]. CSW met with patient at bedside. Patient did not have any needs besides transportation and plans to return to [facility] at discharge. Patient required assistance with transportation back to the halfway house; CSW provided bus passes for patient to get back, and to pick up medications. Patient indicated he alerted [facility] staff he was in the hospital.  [place] signing off.  Estanislado Emms, [place]

## 2016-10-10 ENCOUNTER — Ambulatory Visit: Payer: Self-pay | Admitting: Cardiology

## 2016-10-16 ENCOUNTER — Encounter: Payer: Self-pay | Admitting: Cardiology

## 2016-10-23 ENCOUNTER — Inpatient Hospital Stay (HOSPITAL_COMMUNITY)
Admission: EM | Admit: 2016-10-23 | Discharge: 2016-10-25 | DRG: 287 | Disposition: A | Discharge status: Home / Self Care | Payer: Medicaid Other | Attending: Family Medicine | Admitting: Family Medicine

## 2016-10-23 ENCOUNTER — Encounter (HOSPITAL_COMMUNITY): Payer: Self-pay | Admitting: Emergency Medicine

## 2016-10-23 ENCOUNTER — Emergency Department (HOSPITAL_COMMUNITY): Payer: Medicaid Other

## 2016-10-23 DIAGNOSIS — I252 Old myocardial infarction: Secondary | ICD-10-CM

## 2016-10-23 DIAGNOSIS — I10 Essential (primary) hypertension: Secondary | ICD-10-CM | POA: Diagnosis present

## 2016-10-23 DIAGNOSIS — K219 Gastro-esophageal reflux disease without esophagitis: Secondary | ICD-10-CM | POA: Diagnosis present

## 2016-10-23 DIAGNOSIS — E785 Hyperlipidemia, unspecified: Secondary | ICD-10-CM | POA: Diagnosis present

## 2016-10-23 DIAGNOSIS — Z96641 Presence of right artificial hip joint: Secondary | ICD-10-CM | POA: Diagnosis present

## 2016-10-23 DIAGNOSIS — I2582 Chronic total occlusion of coronary artery: Secondary | ICD-10-CM | POA: Diagnosis present

## 2016-10-23 DIAGNOSIS — Z8249 Family history of ischemic heart disease and other diseases of the circulatory system: Secondary | ICD-10-CM

## 2016-10-23 DIAGNOSIS — Z7982 Long term (current) use of aspirin: Secondary | ICD-10-CM

## 2016-10-23 DIAGNOSIS — Z951 Presence of aortocoronary bypass graft: Secondary | ICD-10-CM

## 2016-10-23 DIAGNOSIS — Z8673 Personal history of transient ischemic attack (TIA), and cerebral infarction without residual deficits: Secondary | ICD-10-CM

## 2016-10-23 DIAGNOSIS — Z79899 Other long term (current) drug therapy: Secondary | ICD-10-CM

## 2016-10-23 DIAGNOSIS — Z7902 Long term (current) use of antithrombotics/antiplatelets: Secondary | ICD-10-CM

## 2016-10-23 DIAGNOSIS — G4733 Obstructive sleep apnea (adult) (pediatric): Secondary | ICD-10-CM | POA: Diagnosis present

## 2016-10-23 DIAGNOSIS — T82855A Stenosis of coronary artery stent, initial encounter: Secondary | ICD-10-CM | POA: Diagnosis present

## 2016-10-23 DIAGNOSIS — Z9981 Dependence on supplemental oxygen: Secondary | ICD-10-CM

## 2016-10-23 DIAGNOSIS — I2510 Atherosclerotic heart disease of native coronary artery without angina pectoris: Principal | ICD-10-CM | POA: Diagnosis present

## 2016-10-23 DIAGNOSIS — R079 Chest pain, unspecified: Secondary | ICD-10-CM | POA: Diagnosis present

## 2016-10-23 MED ORDER — MORPHINE SULFATE (PF) 4 MG/ML IV SOLN
4.0000 mg | Freq: Once | INTRAVENOUS | Status: AC
  Administered 2016-10-24: 4 mg via INTRAVENOUS
  Filled 2016-10-23: qty 1

## 2016-10-23 NOTE — ED Notes (Signed)
Patient transported to X-ray 

## 2016-10-23 NOTE — ED Triage Notes (Signed)
Pt present via EMS for chest pain after getting up to go to the bathroom. He has angina and is normally relieved by home nitro. Pt has had a total of 4 nitro without relief and has had 250mg  ASA. Pt has extensive cardiac history

## 2016-10-23 NOTE — ED Provider Notes (Signed)
[facility]-EMERGENCY DEPT Provider Note   CSN: [id number] Arrival date & time: 10/23/16  2226     History   Chief Complaint Chief Complaint  Patient presents with  . Chest Pain    HPI Connor Baker is a 56 y.o. male.  Patient with PMH of CAD with stents, CHF, HL, HTN, presents to the ED with a chief complaint of chest pain.  He states that the pain started about 9pm tonight when he got up to use the bathroom.  He states that he took 2 nitro with some brief relief, but pain keeps on returning.  He states that the pain is central, but feels like he has a cramp in his arm and hand.  He reports associated SOB, diaphoresis and an episode of vomiting.  He was given aspirin by EMS.  He currently rates his pain as an 8/10.  He reports that otherwise he has been doing well for the past month.  He states that he has had a few brief episodes, but the symptoms have always resolved with nitro.   The history is provided by the patient. No language interpreter was used.    Past Medical History:  Diagnosis Date  . Arthritis    "legs" (08/28/2016)  . CHF (congestive heart failure) (HCC)   . Chronic lower back pain   . Coronary artery disease   . Depression   . GERD (gastroesophageal reflux disease)   . Headache    "monthly" (08/28/2016)  . High cholesterol   . Hypertension   . MI (myocardial infarction) (HCC) 1992; 1994; 1995  . On home oxygen therapy    "4L just at night w/my CPAP" (08/28/2016)  . OSA (obstructive sleep apnea)    w/oxygen" (08/28/2016)  . Stroke [facility]) 2005   "mild one; faded my memory" (08/28/2016)    Patient Active Problem List   Diagnosis Date Noted  . Chest pain 09/23/2016  . Memory change   . Hyperlipidemia 08/29/2016  . CAD (coronary artery disease), native coronary artery 08/28/2016  . Hypertension, essential   . Chest pain with moderate risk of acute coronary syndrome 08/27/2016    Past Surgical History:  Procedure Laterality Date  . ANKLE FRACTURE SURGERY Right 2016   . CORONARY ANGIOPLASTY    . CORONARY ANGIOPLASTY WITH STENT PLACEMENT  1992 - 2017   "I've got a total of 16 stents in me" (08/28/2016)  . CORONARY BALLOON ANGIOPLASTY Right 08/28/2016   Procedure: Coronary Balloon Angioplasty;  Surgeon: Lyn Records, MD;  Location: [facility] INVASIVE CV LAB;  Service: Cardiovascular;  Laterality: Right;  . FRACTURE SURGERY    . LEFT HEART CATH AND CORONARY ANGIOGRAPHY N/A 08/28/2016   Procedure: Left Heart Cath and Coronary Angiography;  Surgeon: Lyn Records, MD;  Location: [facility] INVASIVE CV LAB;  Service: Cardiovascular;  Laterality: N/A;  . TOTAL HIP ARTHROPLASTY Right 1990       Home Medications    Prior to Admission medications   Medication Sig Start Date End Date Taking? Authorizing Provider  amLODipine (NORVASC) 5 MG tablet Take 5 mg by mouth daily.    [provider]  aspirin EC 325 MG tablet Take 325 mg by mouth daily.    [provider]  atorvastatin (LIPITOR) 80 MG tablet Take 80 mg by mouth daily.    [provider]  clopidogrel (PLAVIX) 75 MG tablet Take 75 mg by mouth daily.    [provider]  isosorbide dinitrate (ISORDIL) 30 MG tablet Take 1  tablet (30 mg total) by mouth 3 (three) times daily. 09/08/16   Robbie Lis M, PA-C  metoprolol tartrate (LOPRESSOR) 50 MG tablet Take 50 mg by mouth 2 (two) times daily.    [provider]  nitroGLYCERIN (NITROSTAT) 0.4 MG SL tablet Place 1 tablet (0.4 mg total) under the tongue every 5 (five) minutes as needed for chest pain. 08/29/16   Arty Baumgartner, NP  omeprazole (PRILOSEC) 20 MG capsule Take 20 mg by mouth daily.    [provider]    Family History Family History  Problem Relation Age of Onset  . Heart attack Father     Social History Social History  Substance Use Topics  . Smoking status: Former Smoker    Packs/day: 0.50    Years: 4.00    Types: Cigarettes  . Smokeless tobacco: Never Used     Comment: "quit smoking in the  1990s"  . Alcohol use Yes     Comment: 08/28/2016 "stopped 10-15 yr ago"     Allergies   Patient has no known allergies.   Review of Systems Review of Systems  All other systems reviewed and are negative.    Physical Exam Updated Vital Signs Pulse 86   Temp 98 F (36.7 C) (Oral)   Resp 18   SpO2 96%   Physical Exam  Constitutional: He is oriented to person, place, and time. He appears well-developed and well-nourished.  HENT:  Head: Normocephalic and atraumatic.  Eyes: Pupils are equal, round, and reactive to light. Conjunctivae and EOM are normal. Right eye exhibits no discharge. Left eye exhibits no discharge. No scleral icterus.  Neck: Normal range of motion. Neck supple. No JVD present.  Cardiovascular: Normal rate, regular rhythm and normal heart sounds.  Exam reveals no gallop and no friction rub.   No murmur heard. Pulmonary/Chest: Effort normal and breath sounds normal. No respiratory distress. He has no wheezes. He has no rales. He exhibits no tenderness.  Abdominal: Soft. He exhibits no distension and no mass. There is no tenderness. There is no rebound and no guarding.  Musculoskeletal: Normal range of motion. He exhibits no edema or tenderness.  Neurological: He is alert and oriented to person, place, and time.  Skin: Skin is warm and dry.  Psychiatric: He has a normal mood and affect. His behavior is normal. Judgment and thought content normal.  Nursing note and vitals reviewed.    ED Treatments / Results  Labs (all labs ordered are listed, but only abnormal results are displayed) Labs Reviewed  BASIC METABOLIC PANEL  CBC  I-STAT TROPONIN, ED    EKG  EKG Interpretation  Date/Time:  Thursday October 23 2016 22:36:33 EDT Ventricular Rate:  85 PR Interval:    QRS Duration: 92 QT Interval:  356 QTC Calculation: 424 R Axis:   81 Text Interpretation:  Sinus rhythm Borderline short PR interval Minimal ST depression, inferior leads Minimal ST elevation,  anterior leads No STEMI.  Confirmed by Alona Bene [phone]) on 10/23/2016 10:38:57 PM       Radiology No results found.  Procedures Procedures (including critical care time)  Medications Ordered in ED Medications  morphine 4 MG/ML injection 4 mg (not administered)     Initial Impression / Assessment and Plan / ED Course  I have reviewed the triage vital signs and the nursing notes.  Pertinent labs & imaging results that were available during my care of the patient were reviewed by me and considered in my medical decision making (  see chart for details).     Patient with acute onset chest pain tonight at 9pm.  Associated SOB, n/v, and radiating pain.  Currently being managed medically by cardiology, but does have known disease with 2 stents.  He was admitted a month ago for the same.  Will treat pain, check labs, and reassess.  Labs are reassuring, but patient still having some pain.  Will give another dose of morphine.  Patient seen by and discussed with Dr. Manus Gunningancour, who recommends admission.  Appreciate Dr. Toniann FailKakrakandy for bringing patient into the hospital.  Final Clinical Impressions(s) / ED Diagnoses   Final diagnoses:  Chest pain, unspecified type    New Prescriptions New Prescriptions   No medications on file     Roxy HorsemanBrowning, Cherita Hebel, Cordelia Poche-C 10/24/16 0114    Glynn Octaveancour, Stephen, MD 10/24/16 1635

## 2016-10-24 ENCOUNTER — Encounter (HOSPITAL_COMMUNITY): Payer: Self-pay | Admitting: Internal Medicine

## 2016-10-24 ENCOUNTER — Inpatient Hospital Stay (HOSPITAL_COMMUNITY)
Admission: EM | Disposition: A | Discharge status: Home / Self Care | Payer: Self-pay | Source: Home / Self Care | Attending: Family Medicine

## 2016-10-24 DIAGNOSIS — K219 Gastro-esophageal reflux disease without esophagitis: Secondary | ICD-10-CM | POA: Diagnosis present

## 2016-10-24 DIAGNOSIS — I2510 Atherosclerotic heart disease of native coronary artery without angina pectoris: Secondary | ICD-10-CM | POA: Diagnosis present

## 2016-10-24 DIAGNOSIS — Z8673 Personal history of transient ischemic attack (TIA), and cerebral infarction without residual deficits: Secondary | ICD-10-CM | POA: Diagnosis not present

## 2016-10-24 DIAGNOSIS — Z9981 Dependence on supplemental oxygen: Secondary | ICD-10-CM | POA: Diagnosis not present

## 2016-10-24 DIAGNOSIS — G4733 Obstructive sleep apnea (adult) (pediatric): Secondary | ICD-10-CM | POA: Diagnosis present

## 2016-10-24 DIAGNOSIS — R072 Precordial pain: Secondary | ICD-10-CM

## 2016-10-24 DIAGNOSIS — I2582 Chronic total occlusion of coronary artery: Secondary | ICD-10-CM | POA: Diagnosis present

## 2016-10-24 DIAGNOSIS — I252 Old myocardial infarction: Secondary | ICD-10-CM | POA: Diagnosis not present

## 2016-10-24 DIAGNOSIS — Z79899 Other long term (current) drug therapy: Secondary | ICD-10-CM | POA: Diagnosis not present

## 2016-10-24 DIAGNOSIS — Z7982 Long term (current) use of aspirin: Secondary | ICD-10-CM | POA: Diagnosis not present

## 2016-10-24 DIAGNOSIS — Z96641 Presence of right artificial hip joint: Secondary | ICD-10-CM | POA: Diagnosis present

## 2016-10-24 DIAGNOSIS — Z7902 Long term (current) use of antithrombotics/antiplatelets: Secondary | ICD-10-CM | POA: Diagnosis not present

## 2016-10-24 DIAGNOSIS — R079 Chest pain, unspecified: Secondary | ICD-10-CM

## 2016-10-24 DIAGNOSIS — Z951 Presence of aortocoronary bypass graft: Secondary | ICD-10-CM | POA: Diagnosis not present

## 2016-10-24 DIAGNOSIS — T82855A Stenosis of coronary artery stent, initial encounter: Secondary | ICD-10-CM | POA: Diagnosis present

## 2016-10-24 DIAGNOSIS — Z8249 Family history of ischemic heart disease and other diseases of the circulatory system: Secondary | ICD-10-CM | POA: Diagnosis not present

## 2016-10-24 DIAGNOSIS — E785 Hyperlipidemia, unspecified: Secondary | ICD-10-CM | POA: Diagnosis present

## 2016-10-24 HISTORY — PX: LEFT HEART CATH AND CORONARY ANGIOGRAPHY: CATH118249

## 2016-10-24 LAB — BASIC METABOLIC PANEL
ANION GAP: 5 (ref 5–15)
BUN: 5 mg/dL — ABNORMAL LOW (ref 6–20)
CHLORIDE: 109 mmol/L (ref 101–111)
CO2: 28 mmol/L (ref 22–32)
CREATININE: 0.93 mg/dL (ref 0.61–1.24)
Calcium: 9.2 mg/dL (ref 8.9–10.3)
GFR calc non Af Amer: >60 mL/min (ref >60)
Glucose, Bld: 109 mg/dL — ABNORMAL HIGH (ref 65–99)
POTASSIUM: 3.7 mmol/L (ref 3.5–5.1)
SODIUM: 142 mmol/L (ref 135–145)

## 2016-10-24 LAB — PROTIME-INR
INR: 0.87
PROTHROMBIN TIME: 11.7 s (ref 11.4–15.2)

## 2016-10-24 LAB — I-STAT TROPONIN, ED: Troponin i, poc: 0 ng/mL (ref 0.00–0.08)

## 2016-10-24 LAB — TROPONIN I
Troponin I: <0.03 ng/mL (ref <0.03)
Troponin I: <0.03 ng/mL (ref <0.03)
Troponin I: <0.03 ng/mL (ref <0.03)

## 2016-10-24 LAB — CBC
HCT: 40.2 % (ref 39.0–52.0)
HEMATOCRIT: 39 % (ref 39.0–52.0)
HEMOGLOBIN: 13.9 g/dL (ref 13.0–17.0)
HEMOGLOBIN: 13.6 g/dL (ref 13.0–17.0)
MCH: 32.5 pg (ref 26.0–34.0)
MCH: 32.5 pg (ref 26.0–34.0)
MCHC: 34.6 g/dL (ref 30.0–36.0)
MCHC: 34.9 g/dL (ref 30.0–36.0)
MCV: 93.9 fL (ref 78.0–100.0)
MCV: 93.3 fL (ref 78.0–100.0)
PLATELETS: 162 10*3/uL (ref 150–400)
Platelets: 155 10*3/uL (ref 150–400)
RBC: 4.28 MIL/uL (ref 4.22–5.81)
RBC: 4.18 MIL/uL — AB (ref 4.22–5.81)
RDW: 13.8 % (ref 11.5–15.5)
RDW: 14 % (ref 11.5–15.5)
WBC: 6.1 10*3/uL (ref 4.0–10.5)
WBC: 6.1 10*3/uL (ref 4.0–10.5)

## 2016-10-24 LAB — CREATININE, SERUM: Creatinine, Ser: 0.95 mg/dL (ref 0.61–1.24)

## 2016-10-24 SURGERY — LEFT HEART CATH AND CORONARY ANGIOGRAPHY
Anesthesia: LOCAL

## 2016-10-24 MED ORDER — LIDOCAINE HCL (PF) 1 % IJ SOLN
INTRAMUSCULAR | Status: AC
  Filled 2016-10-24: qty 30

## 2016-10-24 MED ORDER — SODIUM CHLORIDE 0.9 % IV SOLN
INTRAVENOUS | Status: AC | PRN
  Administered 2016-10-24: 75 mL/h via INTRAVENOUS

## 2016-10-24 MED ORDER — ASPIRIN 81 MG PO CHEW: 81.0000 mg | CHEWABLE_TABLET | ORAL | Status: DC

## 2016-10-24 MED ORDER — IOPAMIDOL (ISOVUE-370) INJECTION 76%
INTRAVENOUS | Status: DC | PRN
  Administered 2016-10-24: 60 mL via INTRA_ARTERIAL

## 2016-10-24 MED ORDER — METOPROLOL TARTRATE 50 MG PO TABS
50.0000 mg | ORAL_TABLET | Freq: Two times a day (BID) | ORAL | Status: DC
  Administered 2016-10-24: 50 mg via ORAL
  Filled 2016-10-24: qty 1

## 2016-10-24 MED ORDER — SODIUM CHLORIDE 0.9 % WEIGHT BASED INFUSION: 80.0000 mL/h | INTRAVENOUS | Status: AC

## 2016-10-24 MED ORDER — SODIUM CHLORIDE 0.9 % IV SOLN: 250.0000 mL | INTRAVENOUS | Status: DC | PRN

## 2016-10-24 MED ORDER — NITROGLYCERIN 0.4 MG SL SUBL: 0.4000 mg | SUBLINGUAL_TABLET | SUBLINGUAL | Status: DC | PRN

## 2016-10-24 MED ORDER — HEPARIN (PORCINE) IN NACL 2-0.9 UNIT/ML-% IJ SOLN
INTRAMUSCULAR | Status: AC | PRN
  Administered 2016-10-24: 1000 mL

## 2016-10-24 MED ORDER — SODIUM CHLORIDE 0.9% FLUSH: 3.0000 mL | INTRAVENOUS | Status: DC | PRN

## 2016-10-24 MED ORDER — LIDOCAINE HCL (PF) 1 % IJ SOLN
INTRAMUSCULAR | Status: DC | PRN
  Administered 2016-10-24: 8 mL via SUBCUTANEOUS

## 2016-10-24 MED ORDER — MORPHINE SULFATE (PF) 4 MG/ML IV SOLN
2.0000 mg | INTRAVENOUS | Status: DC | PRN
  Administered 2016-10-24: 2 mg via INTRAVENOUS
  Filled 2016-10-24: qty 1

## 2016-10-24 MED ORDER — FENTANYL CITRATE (PF) 100 MCG/2ML IJ SOLN
INTRAMUSCULAR | Status: AC
  Filled 2016-10-24: qty 2

## 2016-10-24 MED ORDER — MORPHINE SULFATE (PF) 4 MG/ML IV SOLN: 4.0000 mg | Freq: Once | INTRAVENOUS | Status: DC

## 2016-10-24 MED ORDER — ATORVASTATIN CALCIUM 80 MG PO TABS
80.0000 mg | ORAL_TABLET | Freq: Every day | ORAL | Status: DC
  Filled 2016-10-24 (×2): qty 1

## 2016-10-24 MED ORDER — IOPAMIDOL (ISOVUE-370) INJECTION 76%
INTRAVENOUS | Status: AC
  Filled 2016-10-24: qty 100

## 2016-10-24 MED ORDER — FENTANYL CITRATE (PF) 100 MCG/2ML IJ SOLN
INTRAMUSCULAR | Status: DC | PRN
  Administered 2016-10-24 (×2): 25 ug via INTRAVENOUS

## 2016-10-24 MED ORDER — ISOSORBIDE DINITRATE 10 MG PO TABS
30.0000 mg | ORAL_TABLET | Freq: Three times a day (TID) | ORAL | Status: DC
  Administered 2016-10-24: 30 mg via ORAL
  Filled 2016-10-24 (×2): qty 1
  Filled 2016-10-24: qty 3
  Filled 2016-10-24: qty 1

## 2016-10-24 MED ORDER — AMLODIPINE BESYLATE 5 MG PO TABS: 5.0000 mg | ORAL_TABLET | Freq: Every day | ORAL | Status: DC

## 2016-10-24 MED ORDER — HEPARIN (PORCINE) IN NACL 2-0.9 UNIT/ML-% IJ SOLN
INTRAMUSCULAR | Status: AC
  Filled 2016-10-24: qty 1000

## 2016-10-24 MED ORDER — SODIUM CHLORIDE 0.9 % IV SOLN
250.0000 mL | INTRAVENOUS | Status: DC | PRN
  Administered 2016-10-25: 250 mL via INTRAVENOUS

## 2016-10-24 MED ORDER — SODIUM CHLORIDE 0.9% FLUSH: 3.0000 mL | Freq: Two times a day (BID) | INTRAVENOUS | Status: DC

## 2016-10-24 MED ORDER — MIDAZOLAM HCL 2 MG/2ML IJ SOLN
INTRAMUSCULAR | Status: AC
  Filled 2016-10-24: qty 2

## 2016-10-24 MED ORDER — ENOXAPARIN SODIUM 40 MG/0.4ML ~~LOC~~ SOLN
40.0000 mg | Freq: Every day | SUBCUTANEOUS | Status: DC
  Filled 2016-10-24: qty 0.4

## 2016-10-24 MED ORDER — SODIUM CHLORIDE 0.9% FLUSH
3.0000 mL | Freq: Two times a day (BID) | INTRAVENOUS | Status: DC
  Administered 2016-10-24: 3 mL via INTRAVENOUS

## 2016-10-24 MED ORDER — CLOPIDOGREL BISULFATE 75 MG PO TABS
75.0000 mg | ORAL_TABLET | Freq: Every day | ORAL | Status: DC
  Administered 2016-10-25: 75 mg via ORAL
  Filled 2016-10-24: qty 1

## 2016-10-24 MED ORDER — ONDANSETRON HCL 4 MG/2ML IJ SOLN: 4.0000 mg | Freq: Four times a day (QID) | INTRAMUSCULAR | Status: DC | PRN

## 2016-10-24 MED ORDER — SODIUM CHLORIDE 0.9 % WEIGHT BASED INFUSION: 3.0000 mL/kg/h | INTRAVENOUS | Status: AC

## 2016-10-24 MED ORDER — MIDAZOLAM HCL 2 MG/2ML IJ SOLN
INTRAMUSCULAR | Status: DC | PRN
  Administered 2016-10-24: 2 mg via INTRAVENOUS

## 2016-10-24 MED ORDER — ACETAMINOPHEN 325 MG PO TABS: 650.0000 mg | ORAL_TABLET | ORAL | Status: DC | PRN

## 2016-10-24 MED ORDER — PANTOPRAZOLE SODIUM 40 MG PO TBEC
40.0000 mg | DELAYED_RELEASE_TABLET | Freq: Every day | ORAL | Status: DC
  Administered 2016-10-25: 40 mg via ORAL
  Filled 2016-10-24: qty 1

## 2016-10-24 MED ORDER — ASPIRIN EC 325 MG PO TBEC
325.0000 mg | DELAYED_RELEASE_TABLET | Freq: Every day | ORAL | Status: DC
  Administered 2016-10-25: 325 mg via ORAL
  Filled 2016-10-24: qty 1

## 2016-10-24 MED ORDER — SODIUM CHLORIDE 0.9 % WEIGHT BASED INFUSION: 1.0000 mL/kg/h | INTRAVENOUS | Status: DC

## 2016-10-24 SURGICAL SUPPLY — 11 items
CATH INFINITI MULTIPACK ST 5F (CATHETERS) ×2 IMPLANT
GLIDESHEATH SLEND SS 6F .021 (SHEATH) IMPLANT
GUIDEWIRE INQWIRE 1.5J.035X260 (WIRE) IMPLANT
INQWIRE 1.5J .035X260CM (WIRE)
KIT HEART LEFT (KITS) ×2 IMPLANT
PACK CARDIAC CATHETERIZATION (CUSTOM PROCEDURE TRAY) ×2 IMPLANT
SHEATH PINNACLE 5F 10CM (SHEATH) ×2 IMPLANT
SYR MEDRAD MARK V 150ML (SYRINGE) ×2 IMPLANT
TRANSDUCER W/STOPCOCK (MISCELLANEOUS) ×2 IMPLANT
TUBING CIL FLEX 10 FLL-RA (TUBING) ×2 IMPLANT
WIRE EMERALD 3MM-J .035X150CM (WIRE) ×2 IMPLANT

## 2016-10-24 NOTE — Progress Notes (Addendum)
Site area: RFA Site Prior to Removal:  Level 0 Pressure Applied For:20 min Manual: yes   Patient Status During Pull:  stable Post Pull Site:  Level 0 Post Pull Instructions Given:  yes Post Pull Pulses Present: palpable Dressing Applied:tegaderm   Bedrest begins @ 1750 till 2150 Comments:

## 2016-10-24 NOTE — Progress Notes (Signed)
  Seen and agree  with Plan by Dr. Toniann FailKakrakandy Patient still in ED--no beds? No cp awaiting cath today and is npo currently CP free   Pleas KochJai Yardley Lekas, MD [facility] [phone](P) [phone]

## 2016-10-24 NOTE — H&P (Signed)
History and Physical    Connor Baker ZOX:[id number] DOB: 02-06-1961 DOA: 10/23/2016  PCP: Lavinia Sharps, NP  Patient coming from: Home.  Chief Complaint: Chest pain.  HPI: Connor Baker is a 56 y.o. male with history of CAD status post stenting last cardiac cath was 2 months ago in July 2018 and at that time and medical management was decided presents to the ER with complaint of chest pain. Patient states he started developing retrosternal chest pressure last evening around 9 PM. Patient also had mild diaphoresis. Denies any associated shortness of breath nausea vomiting.   ED Course: In the ER patient's chest pain initially improved with morphine but started recurring again. Cardiac markers were negative EKG was showing nonspecific findings and chest x-ray was showing bronchitis findings. Patient is being admitted for further chest pain management.  Review of Systems: As per HPI, rest all negative.   Past Medical History:  Diagnosis Date  . Arthritis    "legs" (08/28/2016)  . CHF (congestive heart failure) (HCC)   . Chronic lower back pain   . Coronary artery disease   . Depression   . GERD (gastroesophageal reflux disease)   . Headache    "monthly" (08/28/2016)  . High cholesterol   . Hypertension   . MI (myocardial infarction) (HCC) 1992; 1994; 1995  . On home oxygen therapy    "4L just at night w/my CPAP" (08/28/2016)  . OSA (obstructive sleep apnea)    w/oxygen" (08/28/2016)  . Stroke [facility]) 2005   "mild one; faded my memory" (08/28/2016)    Past Surgical History:  Procedure Laterality Date  . ANKLE FRACTURE SURGERY Right 2016  . CORONARY ANGIOPLASTY    . CORONARY ANGIOPLASTY WITH STENT PLACEMENT  1992 - 2017   "I've got a total of 16 stents in me" (08/28/2016)  . CORONARY BALLOON ANGIOPLASTY Right 08/28/2016   Procedure: Coronary Balloon Angioplasty;  Surgeon: Lyn Records, MD;  Location: [facility] INVASIVE CV LAB;  Service: Cardiovascular;  Laterality: Right;  . FRACTURE  SURGERY    . LEFT HEART CATH AND CORONARY ANGIOGRAPHY N/A 08/28/2016   Procedure: Left Heart Cath and Coronary Angiography;  Surgeon: Lyn Records, MD;  Location: [facility] INVASIVE CV LAB;  Service: Cardiovascular;  Laterality: N/A;  . TOTAL HIP ARTHROPLASTY Right 1990     reports that he has quit smoking. His smoking use included Cigarettes. He has a 2.00 pack-year smoking history. He has never used smokeless tobacco. He reports that he drinks alcohol. He reports that he does not use drugs.  No Known Allergies  Family History  Problem Relation Age of Onset  . Heart attack Father     Prior to Admission medications   Medication Sig Start Date End Date Taking? Authorizing Provider  amLODipine (NORVASC) 5 MG tablet Take 5 mg by mouth daily.   Yes [provider]  aspirin EC 325 MG tablet Take 325 mg by mouth daily.   Yes [provider]  atorvastatin (LIPITOR) 80 MG tablet Take 80 mg by mouth daily.   Yes [provider]  clopidogrel (PLAVIX) 75 MG tablet Take 75 mg by mouth daily.   Yes [provider]  isosorbide dinitrate (ISORDIL) 30 MG tablet Take 1 tablet (30 mg total) by mouth 3 (three) times daily. 09/08/16  Yes Robbie Lis M, PA-C  metoprolol tartrate (LOPRESSOR) 50 MG tablet Take 50 mg by mouth 2 (two) times daily.   Yes [provider]  nitroGLYCERIN (NITROSTAT) 0.4  MG SL tablet Place 1 tablet (0.4 mg total) under the tongue every 5 (five) minutes as needed for chest pain. 08/29/16  Yes Arty Baumgartner, NP  omeprazole (PRILOSEC) 20 MG capsule Take 20 mg by mouth daily.   Yes [provider]    Physical Exam: Vitals:   10/23/16 2315 10/23/16 2330 10/23/16 2345 10/24/16 0000  BP: 109/87 105/88 106/78 113/79  Pulse: 89 81 75 72  Resp: 15 17 18 15   Temp:      TempSrc:      SpO2: 95% 97% 97% 100%      Constitutional: Moderately built and nourished. Vitals:   10/23/16 2315 10/23/16 2330 10/23/16 2345 10/24/16 0000    BP: 109/87 105/88 106/78 113/79  Pulse: 89 81 75 72  Resp: 15 17 18 15   Temp:      TempSrc:      SpO2: 95% 97% 97% 100%   Eyes: Anicteric no pallor. ENMT: No discharge from the ears eyes nose or mouth. Neck: No mass felt. No neck rigidity. Respiratory: No rhonchi or crepitations. Cardiovascular: S1-S2 heard no murmurs appreciated. Abdomen: Soft nontender bowel sounds present. Musculoskeletal: No edema. No joint effusion. Skin: No rash. Skin appears warm. Neurologic: Alert awake oriented to time place and person. Moves all extremities. Psychiatric: Appears normal. Normal affect.   Labs on Admission: I have personally reviewed following labs and imaging studies  CBC:  Recent Labs Lab 10/23/16 2359  WBC 6.1  HGB 13.6  HCT 39.0  MCV 93.3  PLT 162   Basic Metabolic Panel:  Recent Labs Lab 10/23/16 2359  NA 142  K 3.7  CL 109  CO2 28  GLUCOSE 109*  BUN 5*  CREATININE 0.93  CALCIUM 9.2   GFR: CrCl cannot be calculated (Unknown ideal weight.). Liver Function Tests: No results for input(s): AST, ALT, ALKPHOS, BILITOT, PROT, ALBUMIN in the last 168 hours. No results for input(s): LIPASE, AMYLASE in the last 168 hours. No results for input(s): AMMONIA in the last 168 hours. Coagulation Profile: No results for input(s): INR, PROTIME in the last 168 hours. Cardiac Enzymes: No results for input(s): CKTOTAL, CKMB, CKMBINDEX, TROPONINI in the last 168 hours. BNP (last 3 results) No results for input(s): PROBNP in the last 8760 hours. HbA1C: No results for input(s): HGBA1C in the last 72 hours. CBG: No results for input(s): GLUCAP in the last 168 hours. Lipid Profile: No results for input(s): CHOL, HDL, LDLCALC, TRIG, CHOLHDL, LDLDIRECT in the last 72 hours. Thyroid Function Tests: No results for input(s): TSH, T4TOTAL, FREET4, T3FREE, THYROIDAB in the last 72 hours. Anemia Panel: No results for input(s): VITAMINB12, FOLATE, FERRITIN, TIBC, IRON, RETICCTPCT in the  last 72 hours. Urine analysis: No results found for: COLORURINE, APPEARANCEUR, LABSPEC, PHURINE, GLUCOSEU, HGBUR, BILIRUBINUR, KETONESUR, PROTEINUR, UROBILINOGEN, NITRITE, LEUKOCYTESUR Sepsis Labs: @LABRCNTIP (procalcitonin:4,lacticidven:4) )No results found for this or any previous visit (from the past 240 hour(s)).   Radiological Exams on Admission: Dg Chest 2 View  Result Date: 10/23/2016 CLINICAL DATA:  Chest pain, LEFT arm tingling, nausea for few hours. History of hypertension and myocardial infarction. EXAM: CHEST  2 VIEW COMPARISON:  Chest radiograph September 22, 2016 FINDINGS: Cardiomediastinal silhouette is normal. Mildly calcified aortic arch. Mild bronchitic changes. Mild hyperinflation. No pleural effusions or focal consolidations. Trachea projects midline and there is no pneumothorax. Soft tissue planes and included osseous structures are non-suspicious. IMPRESSION: Mild bronchitic changes and mild hyperinflation. Aortic Atherosclerosis (ICD10-I70.0). Electronically Signed   By: Michel Santee.D.  On: 10/23/2016 23:23    EKG: Independently reviewed. Normal sinus rhythm with nonspecific ST-T changes.  Assessment/Plan Principal Problem:   Chest pain Active Problems:   Hypertension, essential   Hyperlipidemia    1. Chest pain and history of CAD status post stenting - last cardiac cath was done in July 2018. Patient is placed on when necessary sublingual nitroglycerin and morphine and continue patient's beta blocker statins and antiplatelet agents. Cycle cardiac markers and I have requested cardiology consult. 2. Hypertension on amlodipine and beta blocker. 3. Hyperlipidemia on statins.  I have reviewed patient's old charts and labs.   DVT prophylaxis: Lovenox. Code Status: Full code.  Family Communication: Discussed with patient.  Disposition Plan: Home.  Consults called: Cardiology.  Admission status: Observation.    Eduard ClosKAKRAKANDY,ARSHAD N. MD [facility] Pager [phone].  If 7PM-7AM, please contact night-coverage [email or web address] Password [email or web address]  10/24/2016, 1:58 AM

## 2016-10-24 NOTE — Interval H&P Note (Signed)
History and Physical Interval Note:  10/24/2016 4:15 PM  Connor Baker  has presented today for surgery, with the diagnosis of cp  The various methods of treatment have been discussed with the patient and family. After consideration of risks, benefits and other options for treatment, the patient has consented to  Procedure(s): LEFT HEART CATH AND CORONARY ANGIOGRAPHY (N/A) as a surgical intervention .  The patient's history has been reviewed, patient examined, no change in status, stable for surgery.  I have reviewed the patient's chart and labs.  Questions were answered to the patient's satisfaction.   Cath Lab Visit (complete for each Cath Lab visit)  Clinical Evaluation Leading to the Procedure:   ACS: Yes.    Non-ACS:    Anginal Classification: CCS III  Anti-ischemic medical therapy: Maximal Therapy (2 or more classes of medications)  Non-Invasive Test Results: No non-invasive testing performed  Prior CABG: Previous CABG        Theron Aristaeter [facility] Specialty HospitalJordanMD,FACC 10/24/2016 4:15 PM

## 2016-10-24 NOTE — Progress Notes (Signed)
Cardiology Consultation:   Patient ID: Connor Baker; [id number]; 12-10-1960   Admit date: 10/23/2016 Date of Consult: 10/24/2016  Primary Care Provider: Lavinia Sharps, NP Primary Cardiologist: Dr. Clifton James   Patient Profile:   Connor Baker is a 56 y.o. male with a hx of multipel MI s/p multiple PCI's at outside hospital, OSA on CPAP, stroke 2005 and HTN  who is being seen today for the evaluation of chest pain at the request of Dr. Mahala Menghini.  History of Present Illness:   Connor Baker developed chest pressure "like a heavy elephant sitting on my chest" accompanied by mild shortness of breath, nausea, vomited X1 and left hand tingling yesterday while on the toilet having a bowel movement. When he got out of the bathroom he called EMS and took a SL NTG. The NTG helped but did not fully relieve his discomfort. He has had continued mild pressure 4/10 since that time. He usually has chest discomfort about 2-3 times per week with exertion that is relieved by NTG. He denies shortness of breath at rest, orthopnea, PND or edema. He has been taking his medications as prescribed. He dose get lightheaded when rising after sitting for an extended time.   The patient was recently admitted for chest pain consistent with unstable angina on 08/27/16. He ruled out for MI with negative troponins. The patient had reported that he has 16 stents and was being considered for CABG at [facility], but this did no take place as he was changing jobs with move to [place]. He underwent a LHC on 08/28/16 that revealed evidence of multiple distal RCA stents including stent in stent. Per cath report there was not record of stents in other locations. The distal RCA had 85% stenosis in the region of the last 2 layers of stent, total occlusion of a left ventricular branch supplied by left to right collaterals, widely patent LM, heavily calcifiec LAD without significant stenosis, 35-40% ostial circ. Scoring balloon angioplasty  with high pressure angioplasty of the 85% in-stent restenosis reduced the occlusion to 50%. Additional stent implantation was not performed. Chest discomfort during PCI was dissimilar to presenting complaint. He was continued on his current medical therapy.   In the office on 7/16 he was still noted to have intermittent chest discomfort and isordil was increased to 30mg  TID.  He was again seen in the ED on 09/23/16 for intermittent episodes of chest squeezing. There was no objective evidence of coronary ischemia. There was also no room for titration of meds due to soft BP and heart arte in the 50's-60's. No further cardiac intervention was done at the time. He was also complaining of headache and CT the head showed no acute findings.   Past Medical History:  Diagnosis Date  . Arthritis    "legs" (08/28/2016)  . CHF (congestive heart failure) (HCC)   . Chronic lower back pain   . Coronary artery disease   . Depression   . GERD (gastroesophageal reflux disease)   . Headache    "monthly" (08/28/2016)  . High cholesterol   . Hypertension   . MI (myocardial infarction) (HCC) 1992; 1994; 1995  . On home oxygen therapy    "4L just at night w/my CPAP" (08/28/2016)  . OSA (obstructive sleep apnea)    w/oxygen" (08/28/2016)  . Stroke [facility]) 2005   "mild one; faded my memory" (08/28/2016)    Past Surgical History:  Procedure Laterality Date  . ANKLE FRACTURE SURGERY Right 2016  .  CORONARY ANGIOPLASTY    . CORONARY ANGIOPLASTY WITH STENT PLACEMENT  1992 - 2017   "I've got a total of 16 stents in me" (08/28/2016)  . CORONARY BALLOON ANGIOPLASTY Right 08/28/2016   Procedure: Coronary Balloon Angioplasty;  Surgeon: Lyn Records, MD;  Location: [facility] INVASIVE CV LAB;  Service: Cardiovascular;  Laterality: Right;  . FRACTURE SURGERY    . LEFT HEART CATH AND CORONARY ANGIOGRAPHY N/A 08/28/2016   Procedure: Left Heart Cath and Coronary Angiography;  Surgeon: Lyn Records, MD;  Location: [facility] INVASIVE CV LAB;   Service: Cardiovascular;  Laterality: N/A;  . TOTAL HIP ARTHROPLASTY Right 1990     Home Medications:  Prior to Admission medications   Medication Sig Start Date End Date Taking? Authorizing Provider  amLODipine (NORVASC) 5 MG tablet Take 5 mg by mouth daily.   Yes [provider]  aspirin EC 325 MG tablet Take 325 mg by mouth daily.   Yes [provider]  atorvastatin (LIPITOR) 80 MG tablet Take 80 mg by mouth daily.   Yes [provider]  clopidogrel (PLAVIX) 75 MG tablet Take 75 mg by mouth daily.   Yes [provider]  isosorbide dinitrate (ISORDIL) 30 MG tablet Take 1 tablet (30 mg total) by mouth 3 (three) times daily. 09/08/16  Yes Robbie Lis M, PA-C  metoprolol tartrate (LOPRESSOR) 50 MG tablet Take 50 mg by mouth 2 (two) times daily.   Yes [provider]  nitroGLYCERIN (NITROSTAT) 0.4 MG SL tablet Place 1 tablet (0.4 mg total) under the tongue every 5 (five) minutes as needed for chest pain. 08/29/16  Yes Arty Baumgartner, NP  omeprazole (PRILOSEC) 20 MG capsule Take 20 mg by mouth daily.   Yes [provider]    Inpatient Medications: Scheduled Meds: . amLODipine  5 mg Oral Daily  . aspirin EC  325 mg Oral Daily  . atorvastatin  80 mg Oral q1800  . clopidogrel  75 mg Oral Daily  . enoxaparin (LOVENOX) injection  40 mg Subcutaneous Daily  . isosorbide dinitrate  30 mg Oral TID  . metoprolol tartrate  50 mg Oral BID  . pantoprazole  40 mg Oral Daily   Continuous Infusions:  PRN Meds: acetaminophen, morphine injection, nitroGLYCERIN, ondansetron (ZOFRAN) IV  Allergies:   No Known Allergies  Social History:   Social History   Social History  . Marital status: Single    Spouse name: N/A  . Number of children: N/A  . Years of education: N/A   Occupational History  . Not on file.   Social History Main Topics  . Smoking status: Former Smoker    Packs/day: 0.50    Years: 4.00    Types: Cigarettes  .  Smokeless tobacco: Never Used     Comment: "quit smoking in the 1990s"  . Alcohol use Yes     Comment: 08/28/2016 "stopped 10-15 yr ago"  . Drug use: No  . Sexual activity: Not Currently   Other Topics Concern  . Not on file   Social History Narrative  . No narrative on file    Family History:    Family History  Problem Relation Age of Onset  . Heart attack Father      ROS:  Please see the history of present illness.  ROS  All other ROS reviewed and negative.     Physical Exam/Data:   Vitals:   10/24/16 0530 10/24/16 0600 10/24/16 0700 10/24/16 0714  BP: (!) 96/54 107/67 109/63  Pulse: (!) 49 (!) 47 (!) 47   Resp: 10 (!) 8 (!) 21   Temp:      TempSrc:      SpO2: 99% 100% 100% 100%   No intake or output data in the 24 hours ending 10/24/16 0810 There were no vitals filed for this visit. There is no height or weight on file to calculate BMI.  General:  Well nourished, well developed, in no acute distress HEENT: normal Lymph: no adenopathy Neck: no JVD Endocrine:  No thryomegaly Vascular: No carotid bruits; FA pulses 2+ bilaterally without bruits  Cardiac:  normal S1, S2; regular rhythm, bradycardic; no murmur  Lungs:  clear to auscultation bilaterally, no wheezing, rhonchi or rales  Abd: soft, nontender, no hepatomegaly  Ext: no edema Musculoskeletal:  No deformities, BUE and BLE strength normal and equal. Brace on right ankle Skin: warm and dry  Neuro:  CNs 2-12 intact, no focal abnormalities noted Psych:  Normal affect   EKG:  The EKG was personally reviewed and demonstrates:  Sinus rhythm at 85 bpm without significant ischemic changes Telemetry:  Telemetry was personally reviewed and demonstrates:  Sinus bradycardia in the 40's-low 60's  Relevant CV Studies:  Left Heart Cath and Coronary Angiography  08/28/16  Conclusion  Evidence of multiple distal right coronary stents including stent with in-stent. Patient bolus of having 16 prior stents. No evidence of  stents in other locations in the distal right coronary when evaluated by cine fluoroscopy.  Distal RCA ISR, 85% within a region that has at least 2 layers of stent. Total occlusion of a left ventricular branches supplied by left to right collaterals.  Widely patent left main.  Heavily calcified proximal LAD without significant obstruction.  Calcified eccentric 35-40% ostial circumflex.  Scoring balloon angioplasty and high pressure angioplasty of the 85% in-stent restenosis, reduced to 50%. Additional stent implantation was not performed. TIMI grade 3 flow noted post procedure.  RECOMMENDATIONS:  Medical therapy as prescribed on admission.  If he will be receiving care at [facility] in the future, we should gather as much information concerning prior procedures as possible ([facility], [facility] in [place], [facility], and possibly other facilities).  Chest discomfort experienced during PCI above was dissimilar to presenting complaint.        Post-Intervention Diagram          Laboratory Data:  Chemistry Recent Labs Lab 10/23/16 2359 10/24/16 0412  NA 142  --   K 3.7  --   CL 109  --   CO2 28  --   GLUCOSE 109*  --   BUN 5*  --   CREATININE 0.93 0.95  CALCIUM 9.2  --   GFRNONAA >60 >60  GFRAA >60 >60  ANIONGAP 5  --     No results for input(s): PROT, ALBUMIN, AST, ALT, ALKPHOS, BILITOT in the last 168 hours. Hematology Recent Labs Lab 10/23/16 2359 10/24/16 0412  WBC 6.1 6.1  RBC 4.18* 4.28  HGB 13.6 13.9  HCT 39.0 40.2  MCV 93.3 93.9  MCH 32.5 32.5  MCHC 34.9 34.6  RDW 14.0 13.8  PLT 162 155   Cardiac Enzymes Recent Labs Lab 10/24/16 0412  TROPONINI <0.03    Recent Labs Lab 10/24/16 0009  TROPIPOC 0.00    BNPNo results for input(s): BNP, PROBNP in the last 168 hours.  DDimer No results for input(s): DDIMER in the last 168 hours.  Radiology/Studies:  Dg Chest 2 View  Result Date: 10/23/2016 CLINICAL  DATA:  Chest pain, LEFT arm  tingling, nausea for few hours. History of hypertension and myocardial infarction. EXAM: CHEST  2 VIEW COMPARISON:  Chest radiograph September 22, 2016 FINDINGS: Cardiomediastinal silhouette is normal. Mildly calcified aortic arch. Mild bronchitic changes. Mild hyperinflation. No pleural effusions or focal consolidations. Trachea projects midline and there is no pneumothorax. Soft tissue planes and included osseous structures are non-suspicious. IMPRESSION: Mild bronchitic changes and mild hyperinflation. Aortic Atherosclerosis (ICD10-I70.0). Electronically Signed   By: Awilda Metroourtnay  Bloomer M.D.   On: 10/23/2016 23:23    Assessment and Plan:   Chest pain -Pt with previous history of MI and [facility] st[facility] (will try to get records). Recent LHC on 08/28/16 showed in-stent restenosis of distal RCA. Scoring balloon angioplasty and high pressure angioplasty of the 85% in-stent restenosis, reduced to 50% with no new stent placed. Other non-obstructive disease.  -Pt continues to have intermittent exertional chest pressure 2-3 times per week, relieved with SL NTG. Yesterday he developed worse chest pressure while having a BM and associated with mild SOB, nausea, vomiting X1 and tingling of the left hand. Mild constant chest pressure has been present since then. -Troponins negative X2. EKG is without significant changes. CXR normal. -Treatment with Plavix and aspirin as well as statin, isordil, bb, amlodipine and prn NTG -I suspect that pt had a vasovagal reaction while having a BM and in the setting of multiple cardiac medications and baseline bradycardia. -There is no room to increase antianginal meds with bradycardia and soft BP -I will try to obtain cardiology records [facility]  Hypertension -treated with amlodipine 5 mg (antianginal therapy) -BP well controlled a little soft.  Continue current medication.   Hyperlipidemia -LDL 40 in 08/2016  on high intensity statin -Continue atorvastatin 80 mg daily  Signed, Berton BonJanine Hammond, NP  10/24/2016 8:10 AM   As above, patient seen and examined. Briefly he is a 56 year old male with past medical history of coronary artery disease status post multiple PCI's, hypertension, hyperlipidemia, CVA with recurrent chest pain. Patient has had multiple procedures in the past by his report. He underwent cardiac catheterization here on July 5 and was found to have in-stent restenosis of the distal right coronary artery. He had balloon angioplasty at that time. He has continued to have intermittent chest pain. His chest pain is substernal. It occurs with exertion. He developed recurrent chest pain while having a bowel movement last evening. There has been some improvement with morphine but it has been persistent. It is not pleuritic, positional or related to food. Electrocardiogram shows sinus rhythm and possible prior inferior infarct. Troponins are negative.  1 recurrent chest pain-patient with history of recent PCI and recurrent chest pain. This will be his second admission since his previous intervention. I feel we need to be definitive and proceed with relook catheterization. The risks and benefits including myocardial infarction, CVA and death were discussed and he agrees to proceed. If PCI site is patent treated medically. Continue beta blocker, calcium blocker and nitrates. Blood pressure will not allow further titration of medications.  2 coronary artery disease-continue aspirin, Plavix and statin.  3 hyperlipidemia-continue statin.  Olga MillersBrian Korbin Mapps, MD

## 2016-10-24 NOTE — ED Notes (Signed)
Pt reports hx of "three heart attacks and 16 stents" also reports family history of heart disease and heart attack in uncle, brother, and father.

## 2016-10-24 NOTE — H&P (View-Only) (Signed)
Cardiology Consultation:   Patient ID: Connor Baker; [id number]; 12-10-1960   Admit date: 10/23/2016 Date of Consult: 10/24/2016  Primary Care Provider: Lavinia Sharps, NP Primary Cardiologist: Dr. Clifton James   Patient Profile:   Connor Baker is a 56 y.o. male with a hx of multipel MI s/p multiple PCI's at outside hospital, OSA on CPAP, stroke 2005 and HTN  who is being seen today for the evaluation of chest pain at the request of Dr. Mahala Menghini.  History of Present Illness:   Connor Baker developed chest pressure "like a heavy elephant sitting on my chest" accompanied by mild shortness of breath, nausea, vomited X1 and left hand tingling yesterday while on the toilet having a bowel movement. When he got out of the bathroom he called EMS and took a SL NTG. The NTG helped but did not fully relieve his discomfort. He has had continued mild pressure 4/10 since that time. He usually has chest discomfort about 2-3 times per week with exertion that is relieved by NTG. He denies shortness of breath at rest, orthopnea, PND or edema. He has been taking his medications as prescribed. He dose get lightheaded when rising after sitting for an extended time.   The patient was recently admitted for chest pain consistent with unstable angina on 08/27/16. He ruled out for MI with negative troponins. The patient had reported that he has 16 stents and was being considered for CABG at [facility], but this did no take place as he was changing jobs with move to [place]. He underwent a LHC on 08/28/16 that revealed evidence of multiple distal RCA stents including stent in stent. Per cath report there was not record of stents in other locations. The distal RCA had 85% stenosis in the region of the last 2 layers of stent, total occlusion of a left ventricular branch supplied by left to right collaterals, widely patent LM, heavily calcifiec LAD without significant stenosis, 35-40% ostial circ. Scoring balloon angioplasty  with high pressure angioplasty of the 85% in-stent restenosis reduced the occlusion to 50%. Additional stent implantation was not performed. Chest discomfort during PCI was dissimilar to presenting complaint. He was continued on his current medical therapy.   In the office on 7/16 he was still noted to have intermittent chest discomfort and isordil was increased to 30mg  TID.  He was again seen in the ED on 09/23/16 for intermittent episodes of chest squeezing. There was no objective evidence of coronary ischemia. There was also no room for titration of meds due to soft BP and heart arte in the 50's-60's. No further cardiac intervention was done at the time. He was also complaining of headache and CT the head showed no acute findings.   Past Medical History:  Diagnosis Date  . Arthritis    "legs" (08/28/2016)  . CHF (congestive heart failure) (HCC)   . Chronic lower back pain   . Coronary artery disease   . Depression   . GERD (gastroesophageal reflux disease)   . Headache    "monthly" (08/28/2016)  . High cholesterol   . Hypertension   . MI (myocardial infarction) (HCC) 1992; 1994; 1995  . On home oxygen therapy    "4L just at night w/my CPAP" (08/28/2016)  . OSA (obstructive sleep apnea)    w/oxygen" (08/28/2016)  . Stroke [facility]) 2005   "mild one; faded my memory" (08/28/2016)    Past Surgical History:  Procedure Laterality Date  . ANKLE FRACTURE SURGERY Right 2016  .  CORONARY ANGIOPLASTY    . CORONARY ANGIOPLASTY WITH STENT PLACEMENT  1992 - 2017   "I've got a total of 16 stents in me" (08/28/2016)  . CORONARY BALLOON ANGIOPLASTY Right 08/28/2016   Procedure: Coronary Balloon Angioplasty;  Surgeon: Lyn Records, MD;  Location: [facility] INVASIVE CV LAB;  Service: Cardiovascular;  Laterality: Right;  . FRACTURE SURGERY    . LEFT HEART CATH AND CORONARY ANGIOGRAPHY N/A 08/28/2016   Procedure: Left Heart Cath and Coronary Angiography;  Surgeon: Lyn Records, MD;  Location: [facility] INVASIVE CV LAB;   Service: Cardiovascular;  Laterality: N/A;  . TOTAL HIP ARTHROPLASTY Right 1990     Home Medications:  Prior to Admission medications   Medication Sig Start Date End Date Taking? Authorizing Provider  amLODipine (NORVASC) 5 MG tablet Take 5 mg by mouth daily.   Yes [provider]  aspirin EC 325 MG tablet Take 325 mg by mouth daily.   Yes [provider]  atorvastatin (LIPITOR) 80 MG tablet Take 80 mg by mouth daily.   Yes [provider]  clopidogrel (PLAVIX) 75 MG tablet Take 75 mg by mouth daily.   Yes [provider]  isosorbide dinitrate (ISORDIL) 30 MG tablet Take 1 tablet (30 mg total) by mouth 3 (three) times daily. 09/08/16  Yes Robbie Lis M, PA-C  metoprolol tartrate (LOPRESSOR) 50 MG tablet Take 50 mg by mouth 2 (two) times daily.   Yes [provider]  nitroGLYCERIN (NITROSTAT) 0.4 MG SL tablet Place 1 tablet (0.4 mg total) under the tongue every 5 (five) minutes as needed for chest pain. 08/29/16  Yes Arty Baumgartner, NP  omeprazole (PRILOSEC) 20 MG capsule Take 20 mg by mouth daily.   Yes [provider]    Inpatient Medications: Scheduled Meds: . amLODipine  5 mg Oral Daily  . aspirin EC  325 mg Oral Daily  . atorvastatin  80 mg Oral q1800  . clopidogrel  75 mg Oral Daily  . enoxaparin (LOVENOX) injection  40 mg Subcutaneous Daily  . isosorbide dinitrate  30 mg Oral TID  . metoprolol tartrate  50 mg Oral BID  . pantoprazole  40 mg Oral Daily   Continuous Infusions:  PRN Meds: acetaminophen, morphine injection, nitroGLYCERIN, ondansetron (ZOFRAN) IV  Allergies:   No Known Allergies  Social History:   Social History   Social History  . Marital status: Single    Spouse name: N/A  . Number of children: N/A  . Years of education: N/A   Occupational History  . Not on file.   Social History Main Topics  . Smoking status: Former Smoker    Packs/day: 0.50    Years: 4.00    Types: Cigarettes  .  Smokeless tobacco: Never Used     Comment: "quit smoking in the 1990s"  . Alcohol use Yes     Comment: 08/28/2016 "stopped 10-15 yr ago"  . Drug use: No  . Sexual activity: Not Currently   Other Topics Concern  . Not on file   Social History Narrative  . No narrative on file    Family History:    Family History  Problem Relation Age of Onset  . Heart attack Father      ROS:  Please see the history of present illness.  ROS  All other ROS reviewed and negative.     Physical Exam/Data:   Vitals:   10/24/16 0530 10/24/16 0600 10/24/16 0700 10/24/16 0714  BP: (!) 96/54 107/67 109/63  Pulse: (!) 49 (!) 47 (!) 47   Resp: 10 (!) 8 (!) 21   Temp:      TempSrc:      SpO2: 99% 100% 100% 100%   No intake or output data in the 24 hours ending 10/24/16 0810 There were no vitals filed for this visit. There is no height or weight on file to calculate BMI.  General:  Well nourished, well developed, in no acute distress HEENT: normal Lymph: no adenopathy Neck: no JVD Endocrine:  No thryomegaly Vascular: No carotid bruits; FA pulses 2+ bilaterally without bruits  Cardiac:  normal S1, S2; regular rhythm, bradycardic; no murmur  Lungs:  clear to auscultation bilaterally, no wheezing, rhonchi or rales  Abd: soft, nontender, no hepatomegaly  Ext: no edema Musculoskeletal:  No deformities, BUE and BLE strength normal and equal. Brace on right ankle Skin: warm and dry  Neuro:  CNs 2-12 intact, no focal abnormalities noted Psych:  Normal affect   EKG:  The EKG was personally reviewed and demonstrates:  Sinus rhythm at 85 bpm without significant ischemic changes Telemetry:  Telemetry was personally reviewed and demonstrates:  Sinus bradycardia in the 40's-low 60's  Relevant CV Studies:  Left Heart Cath and Coronary Angiography  08/28/16  Conclusion  Evidence of multiple distal right coronary stents including stent with in-stent. Patient bolus of having 16 prior stents. No evidence of  stents in other locations in the distal right coronary when evaluated by cine fluoroscopy.  Distal RCA ISR, 85% within a region that has at least 2 layers of stent. Total occlusion of a left ventricular branches supplied by left to right collaterals.  Widely patent left main.  Heavily calcified proximal LAD without significant obstruction.  Calcified eccentric 35-40% ostial circumflex.  Scoring balloon angioplasty and high pressure angioplasty of the 85% in-stent restenosis, reduced to 50%. Additional stent implantation was not performed. TIMI grade 3 flow noted post procedure.  RECOMMENDATIONS:  Medical therapy as prescribed on admission.  If he will be receiving care at [facility] in the future, we should gather as much information concerning prior procedures as possible ([facility], [place] in [place], [facility], and possibly other facilities).  Chest discomfort experienced during PCI above was dissimilar to presenting complaint.        Post-Intervention Diagram          Laboratory Data:  Chemistry Recent Labs Lab 10/23/16 2359 10/24/16 0412  NA 142  --   K 3.7  --   CL 109  --   CO2 28  --   GLUCOSE 109*  --   BUN 5*  --   CREATININE 0.93 0.95  CALCIUM 9.2  --   GFRNONAA >60 >60  GFRAA >60 >60  ANIONGAP 5  --     No results for input(s): PROT, ALBUMIN, AST, ALT, ALKPHOS, BILITOT in the last 168 hours. Hematology Recent Labs Lab 10/23/16 2359 10/24/16 0412  WBC 6.1 6.1  RBC 4.18* 4.28  HGB 13.6 13.9  HCT 39.0 40.2  MCV 93.3 93.9  MCH 32.5 32.5  MCHC 34.9 34.6  RDW 14.0 13.8  PLT 162 155   Cardiac Enzymes Recent Labs Lab 10/24/16 0412  TROPONINI <0.03    Recent Labs Lab 10/24/16 0009  TROPIPOC 0.00    BNPNo results for input(s): BNP, PROBNP in the last 168 hours.  DDimer No results for input(s): DDIMER in the last 168 hours.  Radiology/Studies:  Dg Chest 2 View  Result Date: 10/23/2016 CLINICAL  DATA:  Chest pain, LEFT arm  tingling, nausea for few hours. History of hypertension and myocardial infarction. EXAM: CHEST  2 VIEW COMPARISON:  Chest radiograph September 22, 2016 FINDINGS: Cardiomediastinal silhouette is normal. Mildly calcified aortic arch. Mild bronchitic changes. Mild hyperinflation. No pleural effusions or focal consolidations. Trachea projects midline and there is no pneumothorax. Soft tissue planes and included osseous structures are non-suspicious. IMPRESSION: Mild bronchitic changes and mild hyperinflation. Aortic Atherosclerosis (ICD10-I70.0). Electronically Signed   By: Awilda Metroourtnay  Bloomer M.D.   On: 10/23/2016 23:23    Assessment and Plan:   Chest pain -Pt with previous history of MI and multiple st[facility] (will try to get records). Recent LHC on 08/28/16 showed in-stent restenosis of distal RCA. Scoring balloon angioplasty and high pressure angioplasty of the 85% in-stent restenosis, reduced to 50% with no new stent placed. Other non-obstructive disease.  -Pt continues to have intermittent exertional chest pressure 2-3 times per week, relieved with SL NTG. Yesterday he developed worse chest pressure while having a BM and associated with mild SOB, nausea, vomiting X1 and tingling of the left hand. Mild constant chest pressure has been present since then. -Troponins negative X2. EKG is without significant changes. CXR normal. -Treatment with Plavix and aspirin as well as statin, isordil, bb, amlodipine and prn NTG -I suspect that pt had a vasovagal reaction while having a BM and in the setting of multiple cardiac medications and baseline bradycardia. -There is no room to increase antianginal meds with bradycardia and soft BP -I will try to obtain cardiology records [facility]  Hypertension -treated with amlodipine 5 mg (antianginal therapy) -BP well controlled a little soft.  Continue current medication.   Hyperlipidemia -LDL 40 in 08/2016  on high intensity statin -Continue atorvastatin 80 mg daily  Signed, Berton BonJanine Hammond, NP  10/24/2016 8:10 AM   As above, patient seen and examined. Briefly he is a 56 year old male with past medical history of coronary artery disease status post multiple PCI's, hypertension, hyperlipidemia, CVA with recurrent chest pain. Patient has had multiple procedures in the past by his report. He underwent cardiac catheterization here on July 5 and was found to have in-stent restenosis of the distal right coronary artery. He had balloon angioplasty at that time. He has continued to have intermittent chest pain. His chest pain is substernal. It occurs with exertion. He developed recurrent chest pain while having a bowel movement last evening. There has been some improvement with morphine but it has been persistent. It is not pleuritic, positional or related to food. Electrocardiogram shows sinus rhythm and possible prior inferior infarct. Troponins are negative.  1 recurrent chest pain-patient with history of recent PCI and recurrent chest pain. This will be his second admission since his previous intervention. I feel we need to be definitive and proceed with relook catheterization. The risks and benefits including myocardial infarction, CVA and death were discussed and he agrees to proceed. If PCI site is patent treated medically. Continue beta blocker, calcium blocker and nitrates. Blood pressure will not allow further titration of medications.  2 coronary artery disease-continue aspirin, Plavix and statin.  3 hyperlipidemia-continue statin.  Olga MillersBrian Crenshaw, MD

## 2016-10-25 DIAGNOSIS — I10 Essential (primary) hypertension: Secondary | ICD-10-CM

## 2016-10-25 MED ORDER — METOPROLOL TARTRATE 25 MG PO TABS: 25.0000 mg | ORAL_TABLET | Freq: Two times a day (BID) | ORAL | Status: DC

## 2016-10-25 NOTE — Discharge Summary (Addendum)
Physician Discharge Summary  Marcelina MorelRex D Crance ZOX:[id number] DOB: 12-01-1960 DOA: 10/23/2016  PCP: Lavinia SharpsPlacey, Mary Ann, NP  Admit date: 10/23/2016 Discharge date: 10/25/2016  Time spent: 25 minutes  Recommendations for Outpatient Follow-up:  1. Needs OP Bmet and cbc 2. Will need to follow with Cardiology here-will cc  3. Amlodipine 5 d/c, Metoprolol 50-->25   Discharge Diagnoses:  Principal Problem:   Chest pain Active Problems:   Hypertension, essential   Hyperlipidemia   Discharge Condition: improved  Diet recommendation:  hh low salt  Filed Weights   10/24/16 1745 10/25/16 0655  Weight: 76 kg (167 lb 8.8 oz) 72.1 kg (158 lb 15.2 oz)    History of present illness:  56 ? MI + stents  LHC 08/28/16=Re-in-stent stenosis Admit with CP Cardilology saw-2/2 to high risk HEArt score> 4 decision to cath-see below reports Med changes as above   Cardiac cath 8/31 1. Single vessel obstructive CAD with chronic occlusion of a very small PL branch with left to right collaterals. The prior PTCA site in the proximal PLOM is unchanged from prior with a 50% stenosis. No new disease to explain his ongoing chest pain 2. Normal LV function 3. Normal LVEDP   Plan: I do not see any culprit lesion for his recurrent chest pain. I question whether his pain is anginal. I would continue medical therapy.   Consultations:  Cardiology  Discharge Exam: Vitals:   10/25/16 0405 10/25/16 0923  BP: (!) 80/68 105/72  Pulse: (!) 55 65  Resp: 15 15  Temp: 98 F (36.7 C) 98.4 F (36.9 C)  SpO2: 96% 97%    General: eomi ncat  Cardiovascular: s1s2 hsm Respiratory:  Clear no added sound  Discharge Instructions    Current Discharge Medication List    CONTINUE these medications which have CHANGED   Details  metoprolol tartrate (LOPRESSOR) 25 MG tablet Take 1 tablet (25 mg total) by mouth 2 (two) times daily.      CONTINUE these medications which have NOT CHANGED   Details  aspirin EC 325 MG  tablet Take 325 mg by mouth daily.    atorvastatin (LIPITOR) 80 MG tablet Take 80 mg by mouth daily.    clopidogrel (PLAVIX) 75 MG tablet Take 75 mg by mouth daily.    isosorbide dinitrate (ISORDIL) 30 MG tablet Take 1 tablet (30 mg total) by mouth 3 (three) times daily. Qty: 90 tablet, Refills: 1    nitroGLYCERIN (NITROSTAT) 0.4 MG SL tablet Place 1 tablet (0.4 mg total) under the tongue every 5 (five) minutes as needed for chest pain. Qty: 25 tablet, Refills: 2    omeprazole (PRILOSEC) 20 MG capsule Take 20 mg by mouth daily.      STOP taking these medications     amLODipine (NORVASC) 5 MG tablet        No Known Allergies    The results of significant diagnostics from this hospitalization (including imaging, microbiology, ancillary and laboratory) are listed below for reference.    Significant Diagnostic Studies: Dg Chest 2 View  Result Date: 10/23/2016 CLINICAL DATA:  Chest pain, LEFT arm tingling, nausea for few hours. History of hypertension and myocardial infarction. EXAM: CHEST  2 VIEW COMPARISON:  Chest radiograph September 22, 2016 FINDINGS: Cardiomediastinal silhouette is normal. Mildly calcified aortic arch. Mild bronchitic changes. Mild hyperinflation. No pleural effusions or focal consolidations. Trachea projects midline and there is no pneumothorax. Soft tissue planes and included osseous structures are non-suspicious. IMPRESSION: Mild bronchitic changes and mild hyperinflation. Aortic  Atherosclerosis (ICD10-I70.0). Electronically Signed   By: Awilda Metro M.D.   On: 10/23/2016 23:23    Microbiology: No results found for this or any previous visit (from the past 240 hour(s)).   Labs: Basic Metabolic Panel:  Recent Labs Lab 10/23/16 2359 10/24/16 0412  NA 142  --   K 3.7  --   CL 109  --   CO2 28  --   GLUCOSE 109*  --   BUN 5*  --   CREATININE 0.93 0.95  CALCIUM 9.2  --    Liver Function Tests: No results for input(s): AST, ALT, ALKPHOS, BILITOT,  PROT, ALBUMIN in the last 168 hours. No results for input(s): LIPASE, AMYLASE in the last 168 hours. No results for input(s): AMMONIA in the last 168 hours. CBC:  Recent Labs Lab 10/23/16 2359 10/24/16 0412  WBC 6.1 6.1  HGB 13.6 13.9  HCT 39.0 40.2  MCV 93.3 93.9  PLT 162 155   Cardiac Enzymes:  Recent Labs Lab 10/24/16 0412 10/24/16 0950 10/24/16 1355  TROPONINI <0.03 <0.03 <0.03   BNP: BNP (last 3 results)  Recent Labs  08/28/16 0502  BNP 40.4    ProBNP (last 3 results) No results for input(s): PROBNP in the last 8760 hours.  CBG: No results for input(s): GLUCAP in the last 168 hours.     SignedRhetta Mura MD   [facility] 10/25/2016, 12:01 PM

## 2016-10-25 NOTE — Progress Notes (Signed)
Progress Note  Patient Name: Marcelina MorelRex D Rummell Date of Encounter: 10/25/2016  Primary Cardiologist: Dr Clifton JamesMcalhany  Subjective   No chest pain or dyspnea  Inpatient Medications    Scheduled Meds: . amLODipine  5 mg Oral Daily  . aspirin  81 mg Oral Pre-Cath  . aspirin EC  325 mg Oral Daily  . atorvastatin  80 mg Oral q1800  . clopidogrel  75 mg Oral Daily  . isosorbide dinitrate  30 mg Oral TID  . metoprolol tartrate  50 mg Oral BID  . pantoprazole  40 mg Oral Daily  . sodium chloride flush  3 mL Intravenous Q12H  . sodium chloride flush  3 mL Intravenous Q12H   Continuous Infusions: . sodium chloride    . sodium chloride 250 mL (10/25/16 0522)  . sodium chloride     PRN Meds: sodium chloride, sodium chloride, acetaminophen, morphine injection, nitroGLYCERIN, ondansetron (ZOFRAN) IV, sodium chloride flush, sodium chloride flush   Vital Signs    Vitals:   10/24/16 2132 10/24/16 2336 10/25/16 0405 10/25/16 0655  BP:  (!) 89/51 (!) 80/68   Pulse: 65 (!) 57 (!) 55   Resp:  14 15   Temp:  98.2 F (36.8 C) 98 F (36.7 C)   TempSrc:  Oral Oral   SpO2:  96% 96%   Weight:    72.1 kg (158 lb 15.2 oz)    Intake/Output Summary (Last 24 hours) at 10/25/16 0840 Last data filed at 10/25/16 [id number]  Gross per 24 hour  Intake                0 ml  Output             1250 ml  Net            -1250 ml   Filed Weights   10/24/16 1745 10/25/16 0655  Weight: 76 kg (167 lb 8.8 oz) 72.1 kg (158 lb 15.2 oz)    Telemetry    Sinus - Personally Reviewed  Physical Exam   GEN: No acute distress.   Neck: No JVD Cardiac: RRR, no murmurs, rubs, or gallops.  Respiratory: Clear to auscultation bilaterally. GI: Soft, nontender, non-distended, femoral cath site with no hematoma and no bruit MS: No edema; No deformity. Neuro:  Nonfocal  Psych: Normal affect   Labs    Chemistry Recent Labs Lab 10/23/16 2359 10/24/16 0412  NA 142  --   K 3.7  --   CL 109  --   CO2 28  --   GLUCOSE  109*  --   BUN 5*  --   CREATININE 0.93 0.95  CALCIUM 9.2  --   GFRNONAA >60 >60  GFRAA >60 >60  ANIONGAP 5  --      Hematology Recent Labs Lab 10/23/16 2359 10/24/16 0412  WBC 6.1 6.1  RBC 4.18* 4.28  HGB 13.6 13.9  HCT 39.0 40.2  MCV 93.3 93.9  MCH 32.5 32.5  MCHC 34.9 34.6  RDW 14.0 13.8  PLT 162 155    Cardiac Enzymes Recent Labs Lab 10/24/16 0412 10/24/16 0950 10/24/16 1355  TROPONINI <0.03 <0.03 <0.03    Recent Labs Lab 10/24/16 0009  TROPIPOC 0.00       Radiology    Dg Chest 2 View  Result Date: 10/23/2016 CLINICAL DATA:  Chest pain, LEFT arm tingling, nausea for few hours. History of hypertension and myocardial infarction. EXAM: CHEST  2 VIEW COMPARISON:  Chest radiograph September 22, 2016  FINDINGS: Cardiomediastinal silhouette is normal. Mildly calcified aortic arch. Mild bronchitic changes. Mild hyperinflation. No pleural effusions or focal consolidations. Trachea projects midline and there is no pneumothorax. Soft tissue planes and included osseous structures are non-suspicious. IMPRESSION: Mild bronchitic changes and mild hyperinflation. Aortic Atherosclerosis (ICD10-I70.0). Electronically Signed   By: Awilda Metro M.D.   On: 10/23/2016 23:23     Patient Profile     56 y.o. male with h/o CAD admitted with recurrent CP.  Assessment & Plan    1 chest pain-no further symptoms. Enzymes are negative. Cardiac catheterization results have been reviewed and medical therapy planned. Would not repeat catheterization without objective evidence of ischemia.  2 hypertension-blood pressure was borderline through the evening. I would discontinue amlodipine and decrease metoprolol to 25 mg twice a day. Continue isosorbide at 30 mg daily. Further adjustments as an outpatient.  3 coronary artery disease-continue aspirin, Plavix and statin.  4 hyperlipidemia-continue statin.  Patient can be discharged from a cardiac standpoint and follow-up with Dr.  Clifton James.  Please call with questions.  Signed, Olga Millers, MD  10/25/2016, 8:40 AM

## 2016-10-28 ENCOUNTER — Encounter (HOSPITAL_COMMUNITY): Payer: Self-pay | Admitting: Cardiology

## 2016-10-28 MED FILL — Fentanyl Citrate Preservative Free (PF) Inj 100 MCG/2ML: INTRAMUSCULAR | Qty: 2 | Status: AC

## 2017-01-26 ENCOUNTER — Encounter (HOSPITAL_COMMUNITY): Payer: Self-pay | Admitting: Emergency Medicine

## 2017-01-26 ENCOUNTER — Emergency Department (HOSPITAL_COMMUNITY): Payer: Medicaid Other

## 2017-01-26 DIAGNOSIS — R0789 Other chest pain: Secondary | ICD-10-CM | POA: Diagnosis present

## 2017-01-26 DIAGNOSIS — Z5321 Procedure and treatment not carried out due to patient leaving prior to being seen by health care provider: Secondary | ICD-10-CM | POA: Insufficient documentation

## 2017-01-26 LAB — BASIC METABOLIC PANEL
ANION GAP: 10 (ref 5–15)
CO2: 24 mmol/L (ref 22–32)
Calcium: 9.7 mg/dL (ref 8.9–10.3)
Chloride: 101 mmol/L (ref 101–111)
Creatinine, Ser: 0.8 mg/dL (ref 0.61–1.24)
Glucose, Bld: 135 mg/dL — ABNORMAL HIGH (ref 65–99)
POTASSIUM: 3.6 mmol/L (ref 3.5–5.1)
SODIUM: 135 mmol/L (ref 135–145)

## 2017-01-26 LAB — CBC
HEMATOCRIT: 39 % (ref 39.0–52.0)
Hemoglobin: 13.8 g/dL (ref 13.0–17.0)
MCH: 33.3 pg (ref 26.0–34.0)
MCHC: 35.4 g/dL (ref 30.0–36.0)
MCV: 94.2 fL (ref 78.0–100.0)
Platelets: 193 10*3/uL (ref 150–400)
RBC: 4.14 MIL/uL — AB (ref 4.22–5.81)
RDW: 13.4 % (ref 11.5–15.5)
WBC: 5 10*3/uL (ref 4.0–10.5)

## 2017-01-26 LAB — I-STAT TROPONIN, ED: Troponin i, poc: 0 ng/mL (ref 0.00–0.08)

## 2017-01-26 LAB — PROTIME-INR
INR: 0.88
PROTHROMBIN TIME: 11.9 s (ref 11.4–15.2)

## 2017-01-26 NOTE — ED Triage Notes (Signed)
BIB EMS from home for onset of central CP while walking home from neighbors. Pain 9/10, tight/heavy in nature. Give 324 ASA and 3NTG with no relief. Pt has significant cardiac hx, stents placed in august.

## 2017-01-27 ENCOUNTER — Emergency Department (HOSPITAL_COMMUNITY)
Admission: EM | Admit: 2017-01-27 | Discharge: 2017-01-27 | Disposition: A | Discharge status: Home / Self Care | Payer: Medicaid Other | Attending: Emergency Medicine | Admitting: Emergency Medicine

## 2017-09-08 ENCOUNTER — Emergency Department (HOSPITAL_COMMUNITY): Payer: Medicaid Other

## 2017-09-08 ENCOUNTER — Emergency Department (HOSPITAL_COMMUNITY)
Admission: EM | Admit: 2017-09-08 | Discharge: 2017-09-09 | Disposition: A | Discharge status: Home / Self Care | Payer: Medicaid Other | Attending: Emergency Medicine | Admitting: Emergency Medicine

## 2017-09-08 ENCOUNTER — Encounter (HOSPITAL_COMMUNITY): Payer: Self-pay

## 2017-09-08 DIAGNOSIS — R079 Chest pain, unspecified: Secondary | ICD-10-CM | POA: Insufficient documentation

## 2017-09-08 DIAGNOSIS — Z5321 Procedure and treatment not carried out due to patient leaving prior to being seen by health care provider: Secondary | ICD-10-CM | POA: Insufficient documentation

## 2017-09-08 LAB — I-STAT TROPONIN, ED: Troponin i, poc: 0.02 ng/mL (ref 0.00–0.08)
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
## 2017-09-09 LAB — CBC
HEMATOCRIT: 43.9 % (ref 39.0–52.0)
HEMOGLOBIN: 14.9 g/dL (ref 13.0–17.0)
MCH: 33.8 pg (ref 26.0–34.0)
MCHC: 33.9 g/dL (ref 30.0–36.0)
MCV: 99.5 fL (ref 78.0–100.0)
Platelets: 238 10*3/uL (ref 150–400)
RBC: 4.41 MIL/uL (ref 4.22–5.81)
RDW: 13.2 % (ref 11.5–15.5)
WBC: 8.2 10*3/uL (ref 4.0–10.5)

## 2017-09-09 LAB — BASIC METABOLIC PANEL
ANION GAP: 11 (ref 5–15)
BUN: 9 mg/dL (ref 6–20)
CO2: 25 mmol/L (ref 22–32)
Calcium: 9.5 mg/dL (ref 8.9–10.3)
Chloride: 105 mmol/L (ref 98–111)
Creatinine, Ser: 0.87 mg/dL (ref 0.61–1.24)
GLUCOSE: 113 mg/dL — AB (ref 70–99)
POTASSIUM: 3.7 mmol/L (ref 3.5–5.1)
Sodium: 141 mmol/L (ref 135–145)

## 2017-09-11 NOTE — ED Notes (Signed)
Follow up call made  Not a personal number  09/11/17//1326  s Teddi Badalamenti rn

## 2017-10-09 ENCOUNTER — Other Ambulatory Visit: Payer: Self-pay

## 2017-10-09 ENCOUNTER — Ambulatory Visit (INDEPENDENT_AMBULATORY_CARE_PROVIDER_SITE_OTHER): Payer: Medicaid Other | Admitting: Internal Medicine

## 2017-10-09 DIAGNOSIS — Z8249 Family history of ischemic heart disease and other diseases of the circulatory system: Secondary | ICD-10-CM | POA: Diagnosis not present

## 2017-10-09 DIAGNOSIS — I252 Old myocardial infarction: Secondary | ICD-10-CM

## 2017-10-09 DIAGNOSIS — Z7982 Long term (current) use of aspirin: Secondary | ICD-10-CM

## 2017-10-09 DIAGNOSIS — I25110 Atherosclerotic heart disease of native coronary artery with unstable angina pectoris: Secondary | ICD-10-CM

## 2017-10-09 DIAGNOSIS — Z720 Tobacco use: Secondary | ICD-10-CM

## 2017-10-09 DIAGNOSIS — F17210 Nicotine dependence, cigarettes, uncomplicated: Secondary | ICD-10-CM

## 2017-10-09 DIAGNOSIS — Z955 Presence of coronary angioplasty implant and graft: Secondary | ICD-10-CM

## 2017-10-09 DIAGNOSIS — Z79899 Other long term (current) drug therapy: Secondary | ICD-10-CM | POA: Diagnosis not present

## 2017-10-09 DIAGNOSIS — E782 Mixed hyperlipidemia: Secondary | ICD-10-CM

## 2017-10-09 DIAGNOSIS — I25118 Atherosclerotic heart disease of native coronary artery with other forms of angina pectoris: Secondary | ICD-10-CM

## 2017-10-09 DIAGNOSIS — Z7902 Long term (current) use of antithrombotics/antiplatelets: Secondary | ICD-10-CM

## 2017-10-09 DIAGNOSIS — E785 Hyperlipidemia, unspecified: Secondary | ICD-10-CM

## 2017-10-09 DIAGNOSIS — I10 Essential (primary) hypertension: Secondary | ICD-10-CM

## 2017-10-09 MED ORDER — ATORVASTATIN CALCIUM 80 MG PO TABS: 80.0000 mg | ORAL_TABLET | Freq: Every day | ORAL | 1 refills | Status: DC

## 2017-10-09 MED ORDER — CLOPIDOGREL BISULFATE 75 MG PO TABS: 75.0000 mg | ORAL_TABLET | Freq: Every day | ORAL | 1 refills | Status: DC

## 2017-10-09 MED ORDER — METOPROLOL TARTRATE 25 MG PO TABS: 12.5000 mg | ORAL_TABLET | Freq: Two times a day (BID) | ORAL | 1 refills | Status: DC

## 2017-10-09 MED ORDER — ASPIRIN EC 325 MG PO TBEC: 325.0000 mg | DELAYED_RELEASE_TABLET | Freq: Every day | ORAL | 3 refills | Status: DC

## 2017-10-09 NOTE — Patient Instructions (Signed)
[name],   Please take metoprolol half a tablet twice a day.  Please take aspirin 1 tablet every day.  Please take clopidogrel or Plavix 1 tablet once a day.  Please take atorvastatin or Lipitor 1 tablet once a day.  These call us if you have any issues affording her medications or if you have any questions or concerns.  Please make a follow-up appointment with us in 2 to 4 weeks for follow-up.  - Dr. Evelene CroonSantos

## 2017-10-11 ENCOUNTER — Encounter: Payer: Self-pay | Admitting: Internal Medicine

## 2017-10-11 DIAGNOSIS — Z720 Tobacco use: Secondary | ICD-10-CM | POA: Insufficient documentation

## 2017-10-11 NOTE — Assessment & Plan Note (Signed)
HLD: Refilled atorvastatin 80 mg QD.

## 2017-10-11 NOTE — Assessment & Plan Note (Signed)
CAD: [name] has en extensive cardiac history including CAD s/p multiple PCI (16 per patient report) with re-in-stent stenosis and several MIs. He also has a history of frequent chest pain thought to be anginal and it appears he was most recently evaluated for this on 09/2016 at which time LHC showed single vessel obstructive CAD with chronic occlusion of very small PL bran with L to R collaterals, no new disease, normal LV function, and normal LVEDP. Cardiology recommended continuing medical therapy. He was previously on metoprolol and Isordil but ran out of all of his medications several weeks ago. The only medication he has left is SL nitro which he takes when he experiences chest pain. Last used it 3 day ago. It appears he was evaluated in the ED on 7/16 for chest pain but unfortunately there is no documentation of this encounter, he did have labs and imaging done [facility](EKF with Q waves on inferior leads but NSR and without signs of acute ischemia, troponin negative, CBC/BMP unremarkable, and CXR negative). He reports feeling well and denies active chest pain today.  He endorses DOE, but is still able to work and ambulate. There is also documentation of ischemic CM, but per last LHC EF was normal. Cardiac and pulmonary exam are normal and he appears euvolemic on exam. Will prescriptions for metoprolol, aspirin, plavix, and atorvastatin to his pharmacy of choice. He reports difficulty affording medications in the past, but has Medicaid.  Discussed patient his medications as prescribed below should be $3 each and advised him to call clinic if he had any issues.  - Resume metoprolol at a lower dose 12.5 mg BID, will need up titration  - Resume clopidogrel 75 mg QD  - Resume ASA 325 mg QD  - Resume atorvastatin 80 mg QD  - Counseled on smoking cessation especially given his severe CAD

## 2017-10-11 NOTE — Assessment & Plan Note (Signed)
Tobacco abuse: Patient quit smoking 26 years ago and recently started 9 months ago. He smokes 0.5ppd. Not interested in quitting at this time.  - Counseled on smoking cessation, pre-contemplative phase

## 2017-10-11 NOTE — Assessment & Plan Note (Signed)
HTN: History of hypertension per chart. However, patient has been off metoprolol for several weeks and is normotensive today. Refilled metoprolol at lower dose as above. Will continue to monitor BP.

## 2017-10-11 NOTE — Progress Notes (Signed)
CC: CAD, HTN, HLD follow up and establish care   HPI:  [name] is a 57 y.o. male with PMH listed below who presents to clinic for follow-up of CAD, hypertension, and hyperlipidemia.  CAD: [name] has en extensive cardiac history including CAD s/p multiple PCI (16 per patient report) with re-in-stent stenosis and several MIs. He also has a history of frequent chest pain thought to be anginal and it appears he was most recently evaluated for this on 09/2016 at which time LHC showed single vessel obstructive CAD with chronic occlusion of very small PL bran with L to R collaterals, no new disease, normal LV function, and normal LVEDP. Cardiology recommended continuing medical therapy. He was previously on metoprolol and Isordil but ran out of all of his medications several weeks ago. The only medication he has left is SL nitro which he takes when he experiences chest pain. Last used it 3 day ago. It appears he was evaluated in the ED on 7/16 for chest pain but unfortunately there is no documentation of this encounter, he did have labs and imaging done [facility](EKF with Q waves on inferior leads but NSR and without signs of acute ischemia, troponin negative, CBC/BMP unremarkable, and CXR negative). He reports feeling well and denies active chest pain today.  He endorses DOE, but is still able to work and ambulate. There is also documentation of ischemic CM, but per last LHC EF was normal. Cardiac and pulmonary exam are normal and he appears euvolemic on exam. Will prescriptions for metoprolol, aspirin, plavix, and atorvastatin to his pharmacy of choice. He reports difficulty affording medications in the past, but has Medicaid.  Discussed patient his medications as prescribed below should be $3 each and advised him to call clinic if he had any issues.  - Resume metoprolol at a lower dose 12.5 mg BID, will need up titration  - Resume clopidogrel 75 mg QD  - Resume ASA 325 mg QD  - Resume atorvastatin 80  mg QD  - Counseled on smoking cessation especially given his severe CAD   HTN: History of hypertension per chart. However, patient has been off metoprolol for several weeks and is normotensive today. Refilled metoprolol at lower dose as above. Will continue to monitor BP.   HLD: Refilled atorvastatin 80 mg QD.   Tobacco abuse: Patient quit smoking 26 years ago and recently started 9 months ago. He smokes 0.5ppd. Not interested in quitting at this time.  - Counseled on smoking cessation, pre-contemplative phase   Past Medical History:  Diagnosis Date  . Arthritis    "legs" (08/28/2016)  . CHF (congestive heart failure) (HCC)   . Chronic lower back pain   . Coronary artery disease   . Depression   . GERD (gastroesophageal reflux disease)   . High cholesterol   . Hypertension   . MI (myocardial infarction) (HCC) 1992; 1994; 1995  . On home oxygen therapy    "4L just at night w/my CPAP" (08/28/2016)  . OSA (obstructive sleep apnea)    w/oxygen" (08/28/2016)  . Stroke [facility](HCC) 2005   "mild one; faded my memory" (08/28/2016)   Past Surgical History:  Procedure Laterality Date  . ANKLE FRACTURE SURGERY Right 2016  . CORONARY ANGIOPLASTY    . CORONARY ANGIOPLASTY WITH STENT PLACEMENT  1992 - 2017   "I've got a total of 16 stents in me" (08/28/2016)  . CORONARY BALLOON ANGIOPLASTY Right 08/28/2016   Procedure: Coronary Balloon Angioplasty;  Surgeon: Verdis PrimeSmith, Henry  W, MD;  Location: [facility] INVASIVE CV LAB;  Service: Cardiovascular;  Laterality: Right;  . FRACTURE SURGERY    . LEFT HEART CATH AND CORONARY ANGIOGRAPHY N/A 08/28/2016   Procedure: Left Heart Cath and Coronary Angiography;  Surgeon: Lyn RecordsSmith, Henry W, MD;  [facility];  Service: Cardiovascular;  Laterality: N/A;  . LEFT HEART CATH AND CORONARY ANGIOGRAPHY N/A 10/24/2016   Procedure: LEFT HEART CATH AND CORONARY ANGIOGRAPHY;  Surgeon: SwazilandJordan, Peter M, MD;  [facility];  Service: Cardiovascular;  Laterality: N/A;  .  TOTAL HIP ARTHROPLASTY Right 1990   Family History  Problem Relation Age of Onset  . Heart attack Father   . Diabetes Mother    Social History   Tobacco Use  . Smoking status: Current Every Day Smoker    Packs/day: 1.00    Years: 1.00    Pack years: 1.00    Types: Cigarettes  . Smokeless tobacco: Never Used  . Tobacco comment: "quit smoking in the 1990s"/ started back smoking at first of 2019  Substance Use Topics  . Alcohol use: Not Currently    Comment: 08/28/2016 "stopped 10-15 yr ago"  . Drug use: Not Currently    Review of Systems:   Review of Systems  Constitutional: Positive for malaise/fatigue. Negative for chills, fever and weight loss.  HENT: Negative for congestion, ear pain and sore throat.   Respiratory: Positive for shortness of breath. Negative for cough.   Cardiovascular: Negative for chest pain, palpitations, orthopnea, claudication and leg swelling.  Gastrointestinal: Negative for abdominal pain, blood in stool, constipation, diarrhea, melena, nausea and vomiting.  Genitourinary: Negative for dysuria, frequency and urgency.  Musculoskeletal: Positive for joint pain and myalgias.  Neurological: Negative for dizziness, sensory change, weakness and headaches.    Physical Exam: Vitals:   10/09/17 1457  BP: 115/71  Pulse: 99  Temp: 98.2 F (36.8 C)  TempSrc: Oral  SpO2: (!) 64%  Weight: 166 lb 9.6 oz (75.6 kg)  Height: 5\' 10"  (1.778 m)   General: Well-appearing male in no acute distress HEENT: NCAT, neck with FROM, MMM, OP clear without exudates or erythema CV: RRR Pulm: CTAB, no wheezes or crackles, no increased work of breathing on room air Abd: Soft, NTND, normoactive bowel sounds Neuro: Alert and oriented x3, no focal deficits noted, ambulates with a cane at baseline Ext: Warm and well-perfused without cyanosis or edema  Assessment & Plan:   See Encounters Tab for problem based charting.  Patient discussed with Dr. Rogelia BogaButcher

## 2017-10-12 NOTE — Progress Notes (Signed)
Internal Medicine Clinic Attending  Case discussed with Dr. Santos-Sanchez at the time of the visit.  We reviewed the resident's history and exam and pertinent patient test results.  I agree with the assessment, diagnosis, and plan of care documented in the resident's note.    

## 2017-11-16 ENCOUNTER — Encounter: Payer: Medicaid Other | Admitting: Internal Medicine

## 2017-11-16 ENCOUNTER — Encounter: Payer: Self-pay | Admitting: Internal Medicine

## 2017-12-31 ENCOUNTER — Encounter (HOSPITAL_COMMUNITY): Payer: Self-pay | Admitting: Emergency Medicine

## 2017-12-31 ENCOUNTER — Other Ambulatory Visit: Payer: Self-pay

## 2017-12-31 ENCOUNTER — Emergency Department (HOSPITAL_COMMUNITY): Payer: Medicaid Other

## 2017-12-31 ENCOUNTER — Inpatient Hospital Stay (HOSPITAL_COMMUNITY)
Admission: EM | Admit: 2017-12-31 | Discharge: 2018-01-02 | DRG: 303 | Disposition: A | Discharge status: Home / Self Care | Payer: Medicaid Other | Attending: Cardiovascular Disease | Admitting: Cardiovascular Disease

## 2017-12-31 DIAGNOSIS — E785 Hyperlipidemia, unspecified: Secondary | ICD-10-CM | POA: Diagnosis present

## 2017-12-31 DIAGNOSIS — F17210 Nicotine dependence, cigarettes, uncomplicated: Secondary | ICD-10-CM | POA: Diagnosis present

## 2017-12-31 DIAGNOSIS — I25118 Atherosclerotic heart disease of native coronary artery with other forms of angina pectoris: Secondary | ICD-10-CM | POA: Diagnosis not present

## 2017-12-31 DIAGNOSIS — G4733 Obstructive sleep apnea (adult) (pediatric): Secondary | ICD-10-CM | POA: Diagnosis present

## 2017-12-31 DIAGNOSIS — Z833 Family history of diabetes mellitus: Secondary | ICD-10-CM | POA: Diagnosis not present

## 2017-12-31 DIAGNOSIS — I2510 Atherosclerotic heart disease of native coronary artery without angina pectoris: Secondary | ICD-10-CM | POA: Diagnosis present

## 2017-12-31 DIAGNOSIS — Z79899 Other long term (current) drug therapy: Secondary | ICD-10-CM

## 2017-12-31 DIAGNOSIS — Z8249 Family history of ischemic heart disease and other diseases of the circulatory system: Secondary | ICD-10-CM | POA: Diagnosis not present

## 2017-12-31 DIAGNOSIS — M1990 Unspecified osteoarthritis, unspecified site: Secondary | ICD-10-CM | POA: Diagnosis present

## 2017-12-31 DIAGNOSIS — I10 Essential (primary) hypertension: Secondary | ICD-10-CM | POA: Diagnosis not present

## 2017-12-31 DIAGNOSIS — I959 Hypotension, unspecified: Secondary | ICD-10-CM | POA: Diagnosis not present

## 2017-12-31 DIAGNOSIS — F329 Major depressive disorder, single episode, unspecified: Secondary | ICD-10-CM | POA: Diagnosis present

## 2017-12-31 DIAGNOSIS — R0602 Shortness of breath: Secondary | ICD-10-CM | POA: Diagnosis not present

## 2017-12-31 DIAGNOSIS — I25110 Atherosclerotic heart disease of native coronary artery with unstable angina pectoris: Secondary | ICD-10-CM | POA: Diagnosis not present

## 2017-12-31 DIAGNOSIS — I252 Old myocardial infarction: Secondary | ICD-10-CM

## 2017-12-31 DIAGNOSIS — I200 Unstable angina: Secondary | ICD-10-CM | POA: Diagnosis present

## 2017-12-31 DIAGNOSIS — R0789 Other chest pain: Secondary | ICD-10-CM | POA: Diagnosis not present

## 2017-12-31 DIAGNOSIS — Z955 Presence of coronary angioplasty implant and graft: Secondary | ICD-10-CM

## 2017-12-31 DIAGNOSIS — Z7982 Long term (current) use of aspirin: Secondary | ICD-10-CM

## 2017-12-31 DIAGNOSIS — Z9114 Patient's other noncompliance with medication regimen: Secondary | ICD-10-CM | POA: Diagnosis not present

## 2017-12-31 DIAGNOSIS — Z9981 Dependence on supplemental oxygen: Secondary | ICD-10-CM | POA: Diagnosis not present

## 2017-12-31 DIAGNOSIS — G8929 Other chronic pain: Secondary | ICD-10-CM | POA: Diagnosis present

## 2017-12-31 DIAGNOSIS — E7800 Pure hypercholesterolemia, unspecified: Secondary | ICD-10-CM | POA: Diagnosis present

## 2017-12-31 DIAGNOSIS — Z716 Tobacco abuse counseling: Secondary | ICD-10-CM

## 2017-12-31 DIAGNOSIS — M545 Low back pain: Secondary | ICD-10-CM | POA: Diagnosis present

## 2017-12-31 DIAGNOSIS — Z9119 Patient's noncompliance with other medical treatment and regimen: Secondary | ICD-10-CM | POA: Diagnosis not present

## 2017-12-31 DIAGNOSIS — Z7902 Long term (current) use of antithrombotics/antiplatelets: Secondary | ICD-10-CM

## 2017-12-31 DIAGNOSIS — Z8673 Personal history of transient ischemic attack (TIA), and cerebral infarction without residual deficits: Secondary | ICD-10-CM

## 2017-12-31 DIAGNOSIS — Z96641 Presence of right artificial hip joint: Secondary | ICD-10-CM | POA: Diagnosis present

## 2017-12-31 DIAGNOSIS — Z720 Tobacco use: Secondary | ICD-10-CM | POA: Diagnosis not present

## 2017-12-31 DIAGNOSIS — K219 Gastro-esophageal reflux disease without esophagitis: Secondary | ICD-10-CM | POA: Diagnosis present

## 2017-12-31 DIAGNOSIS — R079 Chest pain, unspecified: Secondary | ICD-10-CM | POA: Diagnosis not present

## 2017-12-31 DIAGNOSIS — Z590 Homelessness: Secondary | ICD-10-CM | POA: Diagnosis not present

## 2017-12-31 DIAGNOSIS — R072 Precordial pain: Secondary | ICD-10-CM

## 2017-12-31 LAB — CBC
HCT: 39.2 % (ref 39.0–52.0)
HEMOGLOBIN: 13 g/dL (ref 13.0–17.0)
MCH: 32.6 pg (ref 26.0–34.0)
MCHC: 33.2 g/dL (ref 30.0–36.0)
MCV: 98.2 fL (ref 80.0–100.0)
PLATELETS: 257 10*3/uL (ref 150–400)
RBC: 3.99 MIL/uL — AB (ref 4.22–5.81)
RDW: 12.9 % (ref 11.5–15.5)
WBC: 7 10*3/uL (ref 4.0–10.5)
nRBC: 0 % (ref 0.0–0.2)

## 2017-12-31 LAB — BASIC METABOLIC PANEL
ANION GAP: 9 (ref 5–15)
BUN: 6 mg/dL (ref 6–20)
CALCIUM: 8.9 mg/dL (ref 8.9–10.3)
CHLORIDE: 103 mmol/L (ref 98–111)
CO2: 25 mmol/L (ref 22–32)
Creatinine, Ser: 0.9 mg/dL (ref 0.61–1.24)
GFR calc non Af Amer: >60 mL/min (ref >60)
Glucose, Bld: 112 mg/dL — ABNORMAL HIGH (ref 70–99)
POTASSIUM: 3.3 mmol/L — AB (ref 3.5–5.1)
SODIUM: 137 mmol/L (ref 135–145)

## 2017-12-31 LAB — HEPATIC FUNCTION PANEL
ALBUMIN: 3.6 g/dL (ref 3.5–5.0)
ALK PHOS: 72 U/L (ref 38–126)
ALT: 10 U/L (ref 0–44)
AST: 19 U/L (ref 15–41)
Bilirubin, Direct: 0.1 mg/dL (ref 0.0–0.2)
Indirect Bilirubin: 0.4 mg/dL (ref 0.3–0.9)
Total Bilirubin: 0.5 mg/dL (ref 0.3–1.2)
Total Protein: 6.3 g/dL — ABNORMAL LOW (ref 6.5–8.1)

## 2017-12-31 LAB — I-STAT TROPONIN, ED: Troponin i, poc: 0 ng/mL (ref 0.00–0.08)

## 2017-12-31 LAB — LIPASE, BLOOD: LIPASE: 26 U/L (ref 11–51)

## 2017-12-31 MED ORDER — NICOTINE 14 MG/24HR TD PT24
14.0000 mg | MEDICATED_PATCH | Freq: Every day | TRANSDERMAL | Status: DC
  Administered 2017-12-31 – 2018-01-02 (×3): 14 mg via TRANSDERMAL
  Filled 2017-12-31 (×3): qty 1

## 2017-12-31 MED ORDER — MORPHINE SULFATE (PF) 2 MG/ML IV SOLN
2.0000 mg | INTRAVENOUS | Status: DC | PRN
  Administered 2018-01-01: 2 mg via INTRAVENOUS
  Filled 2017-12-31: qty 1

## 2017-12-31 MED ORDER — HEPARIN BOLUS VIA INFUSION
4000.0000 [IU] | Freq: Once | INTRAVENOUS | Status: AC
  Administered 2018-01-01: 4000 [IU] via INTRAVENOUS
  Filled 2017-12-31: qty 4000

## 2017-12-31 MED ORDER — ONDANSETRON HCL 4 MG/2ML IJ SOLN
4.0000 mg | Freq: Once | INTRAMUSCULAR | Status: AC
  Administered 2017-12-31: 4 mg via INTRAVENOUS
  Filled 2017-12-31: qty 2

## 2017-12-31 MED ORDER — ACETAMINOPHEN 325 MG PO TABS: 650.0000 mg | ORAL_TABLET | ORAL | Status: DC | PRN

## 2017-12-31 MED ORDER — HEPARIN (PORCINE) 25000 UT/250ML-% IV SOLN
1300.0000 [IU]/h | INTRAVENOUS | Status: DC
  Administered 2018-01-01: 1300 [IU]/h via INTRAVENOUS
  Administered 2018-01-01: 1000 [IU]/h via INTRAVENOUS
  Filled 2017-12-31 (×3): qty 250

## 2017-12-31 MED ORDER — NITROGLYCERIN 0.4 MG SL SUBL: 0.4000 mg | SUBLINGUAL_TABLET | SUBLINGUAL | Status: DC | PRN

## 2017-12-31 MED ORDER — ASPIRIN EC 81 MG PO TBEC
81.0000 mg | DELAYED_RELEASE_TABLET | Freq: Every day | ORAL | Status: DC
  Administered 2018-01-01 – 2018-01-02 (×2): 81 mg via ORAL
  Filled 2017-12-31 (×2): qty 1

## 2017-12-31 MED ORDER — METOPROLOL TARTRATE 25 MG PO TABS
25.0000 mg | ORAL_TABLET | Freq: Two times a day (BID) | ORAL | Status: DC
  Administered 2017-12-31 – 2018-01-01 (×2): 25 mg via ORAL
  Filled 2017-12-31 (×2): qty 1

## 2017-12-31 MED ORDER — MORPHINE SULFATE (PF) 4 MG/ML IV SOLN
4.0000 mg | Freq: Once | INTRAVENOUS | Status: AC
  Administered 2017-12-31: 4 mg via INTRAVENOUS
  Filled 2017-12-31: qty 1

## 2017-12-31 MED ORDER — ONDANSETRON HCL 4 MG/2ML IJ SOLN: 4.0000 mg | Freq: Four times a day (QID) | INTRAMUSCULAR | Status: DC | PRN

## 2017-12-31 MED ORDER — ATORVASTATIN CALCIUM 80 MG PO TABS
80.0000 mg | ORAL_TABLET | Freq: Every day | ORAL | Status: DC
  Administered 2017-12-31 – 2018-01-01 (×2): 80 mg via ORAL
  Filled 2017-12-31 (×2): qty 1

## 2017-12-31 NOTE — H&P (Signed)
Cardiology History & Physical    Patient ID: Connor Baker MRN: [id number], DOB: Jul 13, 1960 Date of Encounter: 12/31/2017, 10:17 PM Primary Physician: Bridget Hartshorn, DO Primary Cardiologist: No primary care provider on file. Primary Electrophysiologist:  None  Chief Complaint: Chest pain Reason for Admission: Unstable angina  Requesting MD: Lynden Oxford  HPI: Connor Baker is a 57 y.o. male with history of coronary artery disease with multiple prior percutaneous coronary interventions to the right coronary artery (most recent LHC 09/2016 with CTO of RPL branch and L->R collaterals), hypertension, hyperlipidemia, obstructive sleep apnea, who presents with acute onset chest discomfort consistent with unstable angina. The patient was working on his scooter this evening, over-exerted himself, and then began having severe chest discomfort. The pain is accompanied by nausea, diaphoresis similar in nature and intensity to prior myocardial infarctions. He is having ongoing chest discomfort. He has not had any hospitalizations since most recent hospitalization here at [facility] in August of last year. He has been off all medications due to lack of affordability and has been homeless.   Past Medical History:  Diagnosis Date  . Arthritis    "legs" (08/28/2016)  . CHF (congestive heart failure) (HCC)   . Chronic lower back pain   . Coronary artery disease   . Depression   . GERD (gastroesophageal reflux disease)   . High cholesterol   . Hypertension   . MI (myocardial infarction) (HCC) 1992; 1994; 1995  . On home oxygen therapy    "4L just at night w/my CPAP" (08/28/2016)  . OSA (obstructive sleep apnea)    w/oxygen" (08/28/2016)  . Stroke [facility]) 2005   "mild one; faded my memory" (08/28/2016)     Surgical History:  Past Surgical History:  Procedure Laterality Date  . ANKLE FRACTURE SURGERY Right 2016  . CORONARY ANGIOPLASTY    . CORONARY ANGIOPLASTY WITH STENT PLACEMENT  1992 -  2017   "I've got a total of 16 stents in me" (08/28/2016)  . CORONARY BALLOON ANGIOPLASTY Right 08/28/2016   Procedure: Coronary Balloon Angioplasty;  Surgeon: Lyn Records, MD;  Location: [facility] INVASIVE CV LAB;  Service: Cardiovascular;  Laterality: Right;  . FRACTURE SURGERY    . LEFT HEART CATH AND CORONARY ANGIOGRAPHY N/A 08/28/2016   Procedure: Left Heart Cath and Coronary Angiography;  Surgeon: Lyn Records, MD;  Location: [facility] INVASIVE CV LAB;  Service: Cardiovascular;  Laterality: N/A;  . LEFT HEART CATH AND CORONARY ANGIOGRAPHY N/A 10/24/2016   Procedure: LEFT HEART CATH AND CORONARY ANGIOGRAPHY;  Surgeon: Swazil[place], Peter M, MD;  Location: [facility] INVASIVE CV LAB;  Service: Cardiovascular;  Laterality: N/A;  . TOTAL HIP ARTHROPLASTY Right 1990     Home Meds: Prior to Admission medications   Medication Sig Start Date End Date Taking? Authorizing Provider  aspirin EC 325 MG tablet Take 1 tablet (325 mg total) by mouth daily. Patient not taking: Reported on 12/31/2017 10/09/17   Burna Cash, MD  atorvastatin (LIPITOR) 80 MG tablet Take 1 tablet (80 mg total) by mouth daily. Patient not taking: Reported on 12/31/2017 10/09/17   Burna Cash, MD  clopidogrel (PLAVIX) 75 MG tablet Take 1 tablet (75 mg total) by mouth daily. Patient not taking: Reported on 12/31/2017 10/09/17   Burna Cash, MD  isosorbide dinitrate (ISORDIL) 30 MG tablet Take 1 tablet (30 mg total) by mouth 3 (three) times daily. Patient not taking: Reported on 12/31/2017 09/08/16   Robbie Lis M, PA-C  metoprolol tartrate (LOPRESSOR) 25  MG tablet Take 0.5 tablets (12.5 mg total) by mouth 2 (two) times daily. Patient not taking: Reported on 12/31/2017 10/09/17   Burna Cash, MD  nitroGLYCERIN (NITROSTAT) 0.4 MG SL tablet Place 1 tablet (0.4 mg total) under the tongue every 5 (five) minutes as needed for chest pain. Patient not taking: Reported on 12/31/2017 08/29/16   Laverda Page B, NP    omeprazole (PRILOSEC) 20 MG capsule Take 20 mg by mouth daily.    [provider]    Allergies: No Known Allergies  The patient is currently homeless. He became incarcerated and lost his job afterward. He is currently smoking approximately 1 pack per day. He denies any drug use or alcohol use.    Family History  Problem Relation Age of Onset  . Heart attack Father   . Diabetes Mother     Review of Systems: General: negative for chills, fever, night sweats or weight changes.  Cardiovascular: positive for chest pain, negative for edema, orthopnea, palpitations, paroxysmal nocturnal dyspnea, shortness of breath or dyspnea on exertion Dermatological: negative for rash Respiratory: negative for cough or wheezing Urologic: negative for hematuria Abdominal: negative for nausea, vomiting, diarrhea, bright red blood per rectum, melena, or hematemesis Neurologic: negative for visual changes, syncope, or dizziness All other systems reviewed and are otherwise negative except as noted above.  Labs:   Lab Results  Component Value Date   WBC 7.0 12/31/2017   HGB 13.0 12/31/2017   HCT 39.2 12/31/2017   MCV 98.2 12/31/2017   PLT 257 12/31/2017    Recent Labs  Lab 12/31/17 2057  PROT 6.3*  BILITOT 0.5  ALKPHOS 72  ALT 10  AST 19   No results for input(s): CKTOTAL, CKMB, TROPONINI in the last 72 hours. Lab Results  Component Value Date   CHOL 125 08/28/2016   HDL 74 08/28/2016   LDLCALC 40 08/28/2016   TRIG 57 08/28/2016   No results found for: DDIMER  Radiology/Studies:  Dg Chest 2 View  Result Date: 12/31/2017 CLINICAL DATA:  Chest pain.  Shortness of breath. EXAM: CHEST - 2 VIEW COMPARISON:  September 08, 2017 FINDINGS: The heart size and mediastinal contours are within normal limits. Both lungs are clear. The visualized skeletal structures are unremarkable. IMPRESSION: No active cardiopulmonary disease. Electronically Signed   By: Gerome Sam III M.D   On: 12/31/2017  21:04   Wt Readings from Last 3 Encounters:  12/31/17 74.8 kg  10/09/17 75.6 kg  01/26/17 70.3 kg    EKG: NSR no acute ST/T changes   I personally reviewed the patients coronary angiogram, from 10/24/2016. This demonstrates single-vessel obstructive disease involving the right posterolateral branch of the RCA with left to right collaterals previously treated with angioplasty. This was treated with balloon angioplasty. There is also an ostial 40% LAD lesion. The LVEF was estimated to be 55% by ventriculogram. Left ventricular end diastolic pressure was normal.   Physical Exam: Blood pressure 110/74, pulse 63, temperature 98.2 F (36.8 C), temperature source Oral, resp. rate 16, height 5\' 10"  (1.778 m), weight 74.8 kg, SpO2 96 %. Body mass index is 23.68 kg/m. General: Well developed, well nourished, uncomfortable appearing  Head: Normocephalic, atraumatic, sclera non-icteric, no xanthomas, nares are without discharge.  Neck: Negative for carotid bruits. JVD not elevated. Lungs: Clear bilaterally to auscultation without wheezes, rales, or rhonchi. Breathing is unlabored. Heart: RRR with S1 S2. No murmurs, rubs, or gallops appreciated. Abdomen: Soft, non-tender, non-distended with normoactive bowel sounds. No hepatomegaly. No rebound/guarding.  No obvious abdominal masses. Msk:  Strength and tone appear normal for age. Extremities: Multiple tattoos. No clubbing or cyanosis. No edema.  Distal pedal pulses are 2+ and equal bilaterally. Neuro: Alert and oriented X 3. No focal deficit. No facial asymmetry. Moves all extremities spontaneously. Psych:  Responds to questions appropriately with a normal affect.    Assessment and Plan  Connor Baker is a 57 y.o. male with history of coronary artery disease with multiple prior percutaneous coronary interventions to the right coronary artery (most recent LHC 09/2016 with CTO of RPL branch and L->R collaterals), hypertension, hyperlipidemia, obstructive  sleep apnea, who presents with acute onset chest discomfort consistent with unstable angina. The patient is at risk for progression of his coronary disease due to inability to afford medications and ongoing smoking.   1. Unstable angina 2. Coronary artery disease. - Aspirin 81 mg po daily - Metoprolol tartrate 25 mg PO BID - Atorvastatin 80 mg PO QHS - Heparin drip ACS protocol 50-75 second goal PTT - SL NTGL PRN - NPO for potential LHC  3. Hypertension - Metoprolol as above  4. Hyperlipidemia. - Atorvastatin as above   Severity of Illness: The appropriate patient status for this patient is INPATIENT. Inpatient status is judged to be reasonable and necessary in order to provide the required intensity of service to ensure the patient's safety. The patient's presenting symptoms, physical exam findings, and initial radiographic and laboratory data in the context of their chronic comorbidities is felt to place them at high risk for further clinical deterioration. Furthermore, it is not anticipated that the patient will be medically stable for discharge from the hospital within 2 midnights of admission. The following factors support the patient status of inpatient.   " The patient's presenting symptoms include chest discomfort diaphoresis nausea . " The worrisome physical exam findings include uncomfortable appearing . " The initial radiographic and laboratory data are worrisome because of history of severe obstructive coronary artery disease and absence of another explanation for chest discomfort . " The chronic co-morbidities include CAD, HTN, HL, smoking.   * I certify that at the point of admission it is my clinical judgment that the patient will require inpatient hospital care spanning beyond 2 midnights from the point of admission due to high intensity of service, high risk for further deterioration and high frequency of surveillance required.*    For questions or updates, please  contact [facility] Please consult [email or web address] for contact info under Cardiology/STEMI.  Signed, Laverda Page, MD 12/31/2017, 10:17 PM

## 2017-12-31 NOTE — ED Provider Notes (Addendum)
[facility] EMERGENCY DEPARTMENT Provider Note   CSN: [id number] Arrival date & time: 12/31/17  2016     History   Chief Complaint No chief complaint on file.   HPI Connor Baker is a 57 y.o. male.  The history is provided by the patient.  Chest Pain   This is a recurrent problem. The current episode started 1 to 2 hours ago. The problem occurs constantly. The problem has not changed since onset.The pain is associated with exertion. The pain is present in the substernal region. The pain is at a severity of 10/10. The pain is severe. The quality of the pain is described as pressure-like, heavy and exertional. The pain does not radiate. The symptoms are aggravated by exertion. Associated symptoms include diaphoresis, nausea and shortness of breath. Pertinent negatives include no abdominal pain, no back pain, no cough, no fever, no headaches, no irregular heartbeat, no leg pain, no lower extremity edema, no malaise/fatigue, no near-syncope, no numbness, no palpitations and no vomiting. He has tried nitroglycerin for the symptoms. The treatment provided no relief. Risk factors include male gender.  His past medical history is significant for CAD and MI.    Past Medical History:  Diagnosis Date  . Arthritis    "legs" (08/28/2016)  . CHF (congestive heart failure) (HCC)   . Chronic lower back pain   . Coronary artery disease   . Depression   . GERD (gastroesophageal reflux disease)   . High cholesterol   . Hypertension   . MI (myocardial infarction) (HCC) 1992; 1994; 1995  . On home oxygen therapy    "4L just at night w/my CPAP" (08/28/2016)  . OSA (obstructive sleep apnea)    w/oxygen" (08/28/2016)  . Stroke [facility]) 2005   "mild one; faded my memory" (08/28/2016)    Patient Active Problem List   Diagnosis Date Noted  . Tobacco abuse 10/11/2017  . Hyperlipidemia 08/29/2016  . CAD (coronary artery disease), native coronary artery 08/28/2016  . Hypertension, essential      Past Surgical History:  Procedure Laterality Date  . ANKLE FRACTURE SURGERY Right 2016  . CORONARY ANGIOPLASTY    . CORONARY ANGIOPLASTY WITH STENT PLACEMENT  1992 - 2017   "I've got a total of 16 stents in me" (08/28/2016)  . CORONARY BALLOON ANGIOPLASTY Right 08/28/2016   Procedure: Coronary Balloon Angioplasty;  Surgeon: Lyn Records, MD;  Location: [facility] INVASIVE CV LAB;  Service: Cardiovascular;  Laterality: Right;  . FRACTURE SURGERY    . LEFT HEART CATH AND CORONARY ANGIOGRAPHY N/A 08/28/2016   Procedure: Left Heart Cath and Coronary Angiography;  Surgeon: Lyn Records, MD;  Location: [facility] INVASIVE CV LAB;  Service: Cardiovascular;  Laterality: N/A;  . LEFT HEART CATH AND CORONARY ANGIOGRAPHY N/A 10/24/2016   Procedure: LEFT HEART CATH AND CORONARY ANGIOGRAPHY;  Surgeon: Swaz[place], Peter M, MD;  Location: [facility] INVASIVE CV LAB;  Service: Cardiovascular;  Laterality: N/A;  . TOTAL HIP ARTHROPLASTY Right 1990        Home Medications    Prior to Admission medications   Medication Sig Start Date End Date Taking? Authorizing Provider  aspirin EC 325 MG tablet Take 1 tablet (325 mg total) by mouth daily. 10/09/17   Santos-Sanchez, Chelsea Primus, MD  atorvastatin (LIPITOR) 80 MG tablet Take 1 tablet (80 mg total) by mouth daily. 10/09/17   Burna Cash, MD  clopidogrel (PLAVIX) 75 MG tablet Take 1 tablet (75 mg total) by mouth daily. 10/09/17   Santos-Sanchez, Chelsea Primus,  MD  isosorbide dinitrate (ISORDIL) 30 MG tablet Take 1 tablet (30 mg total) by mouth 3 (three) times daily. 09/08/16   Robbie Lis M, PA-C  metoprolol tartrate (LOPRESSOR) 25 MG tablet Take 0.5 tablets (12.5 mg total) by mouth 2 (two) times daily. 10/09/17   Santos-Sanchez, Chelsea Primus, MD  nitroGLYCERIN (NITROSTAT) 0.4 MG SL tablet Place 1 tablet (0.4 mg total) under the tongue every 5 (five) minutes as needed for chest pain. 08/29/16   Arty Baumgartner, NP  omeprazole (PRILOSEC) 20 MG capsule Take 20 mg by mouth daily.     [provider]    Family History Family History  Problem Relation Age of Onset  . Heart attack Father   . Diabetes Mother     Social History Social History   Tobacco Use  . Smoking status: Current Every Day Smoker    Packs/day: 1.00    Years: 1.00    Pack years: 1.00    Types: Cigarettes  . Smokeless tobacco: Never Used  . Tobacco comment: "quit smoking in the 1990s"/ started back smoking at first of 2019  Substance Use Topics  . Alcohol use: Not Currently    Comment: 08/28/2016 "stopped 10-15 yr ago"  . Drug use: Not Currently     Allergies   Patient has no known allergies.   Review of Systems Review of Systems  Constitutional: Positive for diaphoresis. Negative for chills, fatigue, fever and malaise/fatigue.  HENT: Negative for congestion.   Eyes: Negative for visual disturbance.  Respiratory: Positive for chest tightness and shortness of breath. Negative for cough, wheezing and stridor.   Cardiovascular: Positive for chest pain. Negative for palpitations, leg swelling and near-syncope.  Gastrointestinal: Positive for nausea. Negative for abdominal pain, constipation, diarrhea and vomiting.  Genitourinary: Negative for frequency.  Musculoskeletal: Negative for back pain, neck pain and neck stiffness.  Skin: Negative for rash and wound.  Neurological: Negative for light-headedness, numbness and headaches.  Psychiatric/Behavioral: Negative for agitation.  All other systems reviewed and are negative.    Physical Exam Updated Vital Signs Ht 5\' 10"  (1.778 m)   Wt 74.8 kg   SpO2 98%   BMI 23.68 kg/m   Physical Exam  Constitutional: He appears well-developed and well-nourished. No distress.  HENT:  Head: Normocephalic and atraumatic.  Nose: Nose normal.  Mouth/Throat: Oropharynx is clear and moist. No oropharyngeal exudate.  Eyes: Pupils are equal, round, and reactive to light. Conjunctivae and EOM are normal.  Neck: Normal range of motion. Neck  supple.  Cardiovascular: Normal rate, regular rhythm and intact distal pulses.  Murmur heard. Pulmonary/Chest: Effort normal and breath sounds normal. No stridor. No respiratory distress. He has no wheezes. He has no rales. He exhibits no tenderness.  Abdominal: Soft. There is no tenderness. There is no rebound.  Musculoskeletal: He exhibits no edema or tenderness.  Neurological: He is alert. No sensory deficit. He exhibits normal muscle tone.  Skin: Skin is warm and dry. Capillary refill takes less than 2 seconds. He is not diaphoretic. No erythema. No pallor.  Psychiatric: He has a normal mood and affect.  Nursing note and vitals reviewed.    ED Treatments / Results  Labs (all labs ordered are listed, but only abnormal results are displayed) Labs Reviewed  BASIC METABOLIC PANEL - Abnormal; Notable for the following components:      Result Value   Potassium 3.3 (*)    Glucose, Bld 112 (*)    All other components within normal limits  CBC -  Abnormal; Notable for the following components:   RBC 3.99 (*)    All other components within normal limits  HEPATIC FUNCTION PANEL - Abnormal; Notable for the following components:   Total Protein 6.3 (*)    All other components within normal limits  MRSA PCR SCREENING  LIPASE, BLOOD  TROPONIN I  TROPONIN I  TROPONIN I  HIV ANTIBODY (ROUTINE TESTING W REFLEX)  BASIC METABOLIC PANEL  CBC  HEPARIN LEVEL (UNFRACTIONATED)  I-STAT TROPONIN, ED    EKG EKG Interpretation  Date/Time:  Thursday December 31 2017 20:23:06 EST Ventricular Rate:  78 PR Interval:    QRS Duration: 93 QT Interval:  364 QTC Calculation: 415 R Axis:   80 Text Interpretation:  Sinus rhythm Borderline short PR interval Repol abnrm suggests ischemia, inferior leads ST elevation, consider anterior injury When compared to prior, no significant changes seen.  No STEMI Confirmed by Theda Belfast ([id number]) on 12/31/2017 8:27:24 PM   Radiology Dg Chest 2 View  Result  Date: 12/31/2017 CLINICAL DATA:  Chest pain.  Shortness of breath. EXAM: CHEST - 2 VIEW COMPARISON:  September 08, 2017 FINDINGS: The heart size and mediastinal contours are within normal limits. Both lungs are clear. The visualized skeletal structures are unremarkable. IMPRESSION: No active cardiopulmonary disease. Electronically Signed   By: Gerome Sam III M.D   On: 12/31/2017 21:04    Procedures Procedures (including critical care time)   CRITICAL CARE Performed by: Canary Brim Monte Zinni Total critical care time: 35 minutes Critical care time was exclusive of separately billable procedures and treating other patients. Critical care was necessary to treat or prevent imminent or life-threatening deterioration. Critical care was time spent personally by me on the following activities: development of treatment plan with patient and/or surrogate as well as nursing, discussions with consultants, evaluation of patient's response to treatment, examination of patient, obtaining history from patient or surrogate, ordering and performing treatments and interventions, ordering and review of laboratory studies, ordering and review of radiographic studies, pulse oximetry and re-evaluation of patient's condition.  Medications Ordered in ED Medications  aspirin EC tablet 81 mg (has no administration in time range)  nitroGLYCERIN (NITROSTAT) SL tablet 0.4 mg (has no administration in time range)  acetaminophen (TYLENOL) tablet 650 mg (has no administration in time range)  ondansetron (ZOFRAN) injection 4 mg (has no administration in time range)  metoprolol tartrate (LOPRESSOR) tablet 25 mg (25 mg Oral Given 12/31/17 2356)  atorvastatin (LIPITOR) tablet 80 mg (80 mg Oral Given 12/31/17 2357)  morphine 2 MG/ML injection 2 mg (has no administration in time range)  heparin ADULT infusion 100 units/mL (25000 units/232mL sodium chloride 0.45%) (1,000 Units/hr Intravenous New Bag/Given 01/01/18 0000)  nicotine  (NICODERM CQ - dosed in mg/24 hours) patch 14 mg (14 mg Transdermal Patch Applied 12/31/17 2357)  ondansetron (ZOFRAN) injection 4 mg (4 mg Intravenous Given 12/31/17 2108)  morphine 4 MG/ML injection 4 mg (4 mg Intravenous Given 12/31/17 2108)  heparin bolus via infusion 4,000 Units (4,000 Units Intravenous Bolus from Bag 01/01/18 0000)     Initial Impression / Assessment and Plan / ED Course  I have reviewed the triage vital signs and the nursing notes.  Pertinent labs & imaging results that were available during my care of the patient were reviewed by me and considered in my medical decision making (see chart for details).     Connor Baker is a 57 y.o. male with a past medical history significant for CAD status post MI with "  16 stents", CHF, prior stroke, hypertension, and hypercholesterolemia who is been off of all medications for the last year who presents with chest pain.  Patient reports that this about an hour ago patient was exerting himself trying to get his scooter restarted when he had gradual onset of chest pressure.  He reports it feels "the same" as his last heart attack last year.  He reports associated shortness of breath, diaphoresis, nausea, and some lightheadedness.  He reports it does not radiate.  He describes the pain as 10 out of 10 in severity at its worst.  It is still severe.  He denies recent trauma or any other preceding symptoms.  He denies any recent lower extremity edema or leg pains.  He denies recent fevers, chills, congestion, or cough.  EKG shows similar T wave abnormalities but no evidence of STEMI.  On exam, lungs are clear and chest is nontender.  Abdomen is nontender.  Patient has symmetric pulses in upper and lower extremities.  No significant lower extremity edema seen.  Next  Given patient's report that his chest pain feels the same as his last MI and his significant cardiac history, patient will have lab testing and chest x-ray to further evaluate.   Anticipate speaking with cardiology and he will likely need admission for high risk chest pain.   Given his continued chest pain with no relief from the nitro, patient will be given pain medication.  Labs were overall reassuring initially.  Chest x-ray unremarkable.  Cardiology will admit patient for further management.   Final Clinical Impressions(s) / ED Diagnoses   Final diagnoses:  Precordial pain    ED Discharge Orders    None     Clinical Impression: 1. Precordial pain     Disposition: Admit  This note was prepared with assistance of Dragon voice recognition software. Occasional wrong-word or sound-a-like substitutions may have occurred due to the inherent limitations of voice recognition software.      Valincia Touch, Canary Brim, MD 01/01/18 0133    Korion Cuevas, Canary Brim, MD 01/10/18 [phone]

## 2017-12-31 NOTE — ED Triage Notes (Signed)
Pt here via [facility] and is homeless, was trying to [place] his moped when he had substernal CP that felt like pressure, has had 3 MI's in the past, pt states it feels like previous MI.  Pt A&O x4.  324 ASA and 1 nitro, pain got worse.

## 2017-12-31 NOTE — Progress Notes (Signed)
ANTICOAGULATION CONSULT NOTE - Initial Consult  Pharmacy Consult for heparin Indication: chest pain/ACS  No Known Allergies  Patient Measurements: Height: 5\' 10"  (177.8 cm) Weight: 165 lb (74.8 kg) IBW/kg (Calculated) : 73 Heparin Dosing Weight: 74.8 kg  Vital Signs: Temp: 98.2 F (36.8 C) (11/07 2022) Temp Source: Oral (11/07 2022) BP: 101/72 (11/07 2200) Pulse Rate: 55 (11/07 2200)  Labs: Recent Labs    12/31/17 2027  HGB 13.0  HCT 39.2  PLT 257     Medical History: Past Medical History:  Diagnosis Date  . Arthritis    "legs" (08/28/2016)  . CHF (congestive heart failure) (HCC)   . Chronic lower back pain   . Coronary artery disease   . Depression   . GERD (gastroesophageal reflux disease)   . High cholesterol   . Hypertension   . MI (myocardial infarction) (HCC) 1992; 1994; 1995  . On home oxygen therapy    "4L just at night w/my CPAP" (08/28/2016)  . OSA (obstructive sleep apnea)    w/oxygen" (08/28/2016)  . Stroke [facility]) 2005   "mild one; faded my memory" (08/28/2016)     Assessment: 57 yo male with CAD s/p prior PCI. Admitted with severe chest discomfort. Has not been taking prescribed medications as an outpatient Starting heparin for rule-out ACS. CBC wnl.  Goal of Therapy:  Heparin level 0.3-0.7 units/ml Monitor platelets by anticoagulation protocol: Yes   Plan:  -Heparin bolus 4000 units x1 then 1000 units/hr -Daily HL, CBC -First level in 6 hours   Connor Baker Friday 12/31/2017,10:41 PM

## 2018-01-01 DIAGNOSIS — E785 Hyperlipidemia, unspecified: Secondary | ICD-10-CM

## 2018-01-01 DIAGNOSIS — I25118 Atherosclerotic heart disease of native coronary artery with other forms of angina pectoris: Secondary | ICD-10-CM

## 2018-01-01 DIAGNOSIS — Z720 Tobacco use: Secondary | ICD-10-CM

## 2018-01-01 DIAGNOSIS — I200 Unstable angina: Secondary | ICD-10-CM

## 2018-01-01 LAB — BASIC METABOLIC PANEL
ANION GAP: 5 (ref 5–15)
BUN: 7 mg/dL (ref 6–20)
CHLORIDE: 109 mmol/L (ref 98–111)
CO2: 26 mmol/L (ref 22–32)
Calcium: 8.6 mg/dL — ABNORMAL LOW (ref 8.9–10.3)
Creatinine, Ser: 0.98 mg/dL (ref 0.61–1.24)
GFR calc Af Amer: >60 mL/min (ref >60)
GFR calc non Af Amer: >60 mL/min (ref >60)
GLUCOSE: 77 mg/dL (ref 70–99)
POTASSIUM: 3.5 mmol/L (ref 3.5–5.1)
Sodium: 140 mmol/L (ref 135–145)

## 2018-01-01 LAB — HIV ANTIBODY (ROUTINE TESTING W REFLEX): HIV Screen 4th Generation wRfx: NONREACTIVE

## 2018-01-01 LAB — HEPARIN LEVEL (UNFRACTIONATED)
HEPARIN UNFRACTIONATED: 0.19 [IU]/mL — AB (ref 0.30–0.70)
Heparin Unfractionated: 0.28 IU/mL — ABNORMAL LOW (ref 0.30–0.70)

## 2018-01-01 LAB — MRSA PCR SCREENING: MRSA BY PCR: NEGATIVE

## 2018-01-01 LAB — CBC
HCT: 37 % — ABNORMAL LOW (ref 39.0–52.0)
Hemoglobin: 12.1 g/dL — ABNORMAL LOW (ref 13.0–17.0)
MCH: 31.8 pg (ref 26.0–34.0)
MCHC: 32.7 g/dL (ref 30.0–36.0)
MCV: 97.4 fL (ref 80.0–100.0)
PLATELETS: 214 10*3/uL (ref 150–400)
RBC: 3.8 MIL/uL — AB (ref 4.22–5.81)
RDW: 13.2 % (ref 11.5–15.5)
WBC: 7.1 10*3/uL (ref 4.0–10.5)
nRBC: 0 % (ref 0.0–0.2)

## 2018-01-01 LAB — TROPONIN I
Troponin I: <0.03 ng/mL (ref <0.03)
Troponin I: <0.03 ng/mL (ref <0.03)

## 2018-01-01 MED ORDER — HEPARIN BOLUS VIA INFUSION
2000.0000 [IU] | Freq: Once | INTRAVENOUS | Status: AC
  Administered 2018-01-01: 2000 [IU] via INTRAVENOUS
  Filled 2018-01-01: qty 2000

## 2018-01-01 MED ORDER — METOPROLOL TARTRATE 12.5 MG HALF TABLET
12.5000 mg | ORAL_TABLET | Freq: Two times a day (BID) | ORAL | Status: DC
  Administered 2018-01-02: 12.5 mg via ORAL
  Filled 2018-01-01: qty 1

## 2018-01-01 MED ORDER — ISOSORBIDE MONONITRATE ER 30 MG PO TB24
15.0000 mg | ORAL_TABLET | Freq: Every day | ORAL | Status: DC
  Administered 2018-01-01 – 2018-01-02 (×2): 15 mg via ORAL
  Filled 2018-01-01 (×3): qty 1

## 2018-01-01 MED ORDER — CLOPIDOGREL BISULFATE 75 MG PO TABS
75.0000 mg | ORAL_TABLET | Freq: Every day | ORAL | Status: DC
  Administered 2018-01-01 – 2018-01-02 (×2): 75 mg via ORAL
  Filled 2018-01-01 (×2): qty 1

## 2018-01-01 NOTE — Progress Notes (Addendum)
Progress Note  Patient Name: Connor Baker Date of Encounter: 01/01/2018  Primary Cardiologist: Dr. Clifton James, MD   Subjective   Pt continues to have 3/10 chest pain without associated symptoms. On hep gtt and NPO if cath indicated. Will discuss with MD.   Inpatient Medications    Scheduled Meds: . aspirin EC  81 mg Oral Daily  . atorvastatin  80 mg Oral q1800  . metoprolol tartrate  25 mg Oral BID  . nicotine  14 mg Transdermal Daily   Continuous Infusions: . heparin 1,200 Units/hr (01/01/18 0658)   PRN Meds: acetaminophen, morphine injection, nitroGLYCERIN, ondansetron (ZOFRAN) IV   Vital Signs    Vitals:   12/31/17 2200 12/31/17 2310 12/31/17 2356 01/01/18 0536  BP: 101/72 (!) 131/91  94/64  Pulse: (!) 55 (!) 59 61 (!) 52  Resp: 17     Temp:  98.3 F (36.8 C)  97.8 F (36.6 C)  TempSrc:  Oral  Oral  SpO2: 97% 98%  97%  Weight:  73.7 kg  69.9 kg  Height:  5\' 10"  (1.778 m)      Intake/Output Summary (Last 24 hours) at 01/01/2018 0759 Last data filed at 01/01/2018 2956 Gross per 24 hour  Intake 345.85 ml  Output 800 ml  Net -454.15 ml   Filed Weights   12/31/17 2021 12/31/17 2310 01/01/18 0536  Weight: 74.8 kg 73.7 kg 69.9 kg    Physical Exam   General: Well developed, well nourished, NAD Skin: Warm, dry, intact  Head: Normocephalic, atraumatic,  clear, moist mucus membranes. Neck: Negative for carotid bruits. No JVD Lungs:Clear to ausculation bilaterally. No wheezes, rales, or rhonchi. Breathing is unlabored. Cardiovascular: RRR with S1 S2. No murmurs, rubs, gallops, or LV heave appreciated. Abdomen: Soft, non-tender, non-distended with normoactive bowel sounds. No hepatomegaly, No rebound/guarding. No obvious abdominal masses. MSK: Strength and tone appear normal for age. 5/5 in all extremities Extremities: No edema. No clubbing or cyanosis. DP/PT pulses 2+ bilaterally Neuro: Alert and oriented. No focal deficits. No facial asymmetry. MAE  spontaneously. Psych: Responds to questions appropriately with normal affect.    Labs    Chemistry Recent Labs  Lab 12/31/17 2027 12/31/17 2057 01/01/18 0521  NA 137  --  140  K 3.3*  --  3.5  CL 103  --  109  CO2 25  --  26  GLUCOSE 112*  --  77  BUN 6  --  7  CREATININE 0.90  --  0.98  CALCIUM 8.9  --  8.6*  PROT  --  6.3*  --   ALBUMIN  --  3.6  --   AST  --  19  --   ALT  --  10  --   ALKPHOS  --  72  --   BILITOT  --  0.5  --   GFRNONAA >60  --  >60  GFRAA >60  --  >60  ANIONGAP 9  --  5     Hematology Recent Labs  Lab 12/31/17 2027 01/01/18 0521  WBC 7.0 7.1  RBC 3.99* 3.80*  HGB 13.0 12.1*  HCT 39.2 37.0*  MCV 98.2 97.4  MCH 32.6 31.8  MCHC 33.2 32.7  RDW 12.9 13.2  PLT 257 214    Cardiac Enzymes Recent Labs  Lab 12/31/17 2334 01/01/18 0521  TROPONINI <0.03 <0.03    Recent Labs  Lab 12/31/17 2031  TROPIPOC 0.00    BNPNo results for input(s): BNP, PROBNP in  the last 168 hours.   DDimer No results for input(s): DDIMER in the last 168 hours.   Radiology    Dg Chest 2 View  Result Date: 12/31/2017 CLINICAL DATA:  Chest pain.  Shortness of breath. EXAM: CHEST - 2 VIEW COMPARISON:  September 08, 2017 FINDINGS: The heart size and mediastinal contours are within normal limits. Both lungs are clear. The visualized skeletal structures are unremarkable. IMPRESSION: No active cardiopulmonary disease. Electronically Signed   By: Gerome Sam III M.D   On: 12/31/2017 21:04   Telemetry    01/01/18 SB HR 50's  - Personally Reviewed  ECG    01/01/18 SB. Initial EKG with questionable ST/T wave chanages however tracing from 01/01/18 AM appears normal- Personally Reviewed  Cardiac Studies   Cardiac catheterization 01/01/18:   1st RPLB lesion, 100 %stenosed.  Post Atrio-1 lesion, 50 %stenosed.  Mid RCA to Dist RCA lesion, 5 %stenosed.  Post Atrio-2 lesion, 50 %stenosed.  Ost Cx lesion, 25 %stenosed.  Mid RCA lesion, 30 %stenosed.  Ost LAD  to Prox LAD lesion, 40 %stenosed.  The left ventricular systolic function is normal.  LV end diastolic pressure is normal.  The left ventricular ejection fraction is 55-65% by visual estimate.   1. Single vessel obstructive CAD with chronic occlusion of a very small PL branch with left to right collaterals. The prior PTCA site in the proximal PLOM is unchanged from prior with a 50% stenosis. No new disease to explain his ongoing chest pain 2. Normal LV function 3. Normal LVEDP  Plan: I do not see any culprit lesion for his recurrent chest pain. I question whether his pain is anginal. I would continue medical therapy.   Cardiac catheterization 08/28/17:   Evidence of multiple distal right coronary stents including stent with in-stent. Patient bolus of having 16 prior stents. No evidence of stents in other locations in the distal right coronary when evaluated by cine fluoroscopy.  Distal RCA ISR, 85% within a region that has at least 2 layers of stent. Total occlusion of a left ventricular branches supplied by left to right collaterals.  Widely patent left main.  Heavily calcified proximal LAD without significant obstruction.  Calcified eccentric 35-40% ostial circumflex.  Scoring balloon angioplasty and high pressure angioplasty of the 85% in-stent restenosis, reduced to 50%. Additional stent implantation was not performed. TIMI grade 3 flow noted post procedure.  RECOMMENDATIONS:   Medical therapy as prescribed on admission.  If he will be receiving care at [facility] in the future, we should gather as much information concerning prior procedures as possible ([facility] [place], [facility], and possibly other facilities).  Chest discomfort experienced during PCI above was dissimilar to presenting complaint.  Patient Profile     57 y.o. male with history of coronary artery disease with multiple prior percutaneous coronary interventions to the right  coronary artery (most recent LHC 09/2016 with CTO of RPL branch and L->R collaterals), hypertension, hyperlipidemia, obstructive sleep apnea, who presents with acute onset chest discomfort consistent with unstable angina. The patient is at risk for progression of his coronary disease due to inability to afford medications and ongoing smoking.   Assessment & Plan    1. Unstable angina with hx of CAD: -Pt presented to [facility] with c/o mid-sternal, exertional chest pain which has been intermittent for the last several months, becoming more frequent and similar to prior anginal symptoms.  -Has multiple stenting to RCA ([facility], [facility] [place], [facility], and possibly other facilities) most recent cath from 09/2016 with no significant change. Prior cath in 08/2016 with in-stent restenosis with scoring balloon angioplasty and high pressure angioplasty of the 85% to distal RCA>>redunced to 50%. Recommendations at that time were for continued medical management . He was lost in follow up and has not taken his medications in over a year secondary to cost.  -Continue ASA 81, atorvastatin 80,  -Will decrease metoprolol tartrate to 12.5 given HR and soft BP  -Continue Heparin gtt  -SL NTGL PRN -NPO for possible cath>>will discuss plan with rounding MD  2. Hypertension: -Soft, 96/65>94/64>131/91>101/72 -Metoprolol as above  3. Hyperlipidemia: -Stable,  -Continue Atorvastatin as above  4. Tobacco Use: -Ongoing tobacco use, smoking cessation recommended  5. Noncompliance: -Lost in follow up with no medication for over a year -Will need case management consultation prior to discharge   Signed, Georgie Chard NP-C [facility] Pager: [phone] 01/01/2018, 7:59 AM      Patient seen and examined. Agree with assessment and plan.  Chest pain essentially resolved.  Troponins are negative.  No ECG changes.  Last 2 previous cardiac catheterizations reviewed from 2018.  Patient has not been  on medical therapy for at least a year.  With negative enzymes and no significant ischemic ECG changes on ECG today recommend reinitiation of anti-ischemic medication, as well as DAPT and high potency statin therapy.  If patient develops recurrent unstable angina with resumption of medical therapy then can consider repeat catheterization but will not plan to do this presently.   Lennette Bihari, MD, [facility] 01/01/2018 11:06 AM  For questions or updates, please contact   Please consult [email or web address] for contact info under Cardiology/STEMI.

## 2018-01-01 NOTE — Progress Notes (Signed)
ANTICOAGULATION CONSULT NOTE  Pharmacy Consult for heparin Indication: chest pain/ACS  No Known Allergies  Patient Measurements: Height: 5\' 10"  (177.8 cm) Weight: 154 lb (69.9 kg) IBW/kg (Calculated) : 73 Heparin Dosing Weight: 74.8 kg  Vital Signs: Temp: 97.8 F (36.6 C) (11/08 0536) Temp Source: Oral (11/08 0536) BP: 94/64 (11/08 0536) Pulse Rate: 52 (11/08 0536)  Labs: Recent Labs    12/31/17 2027 12/31/17 2334 01/01/18 0521  HGB 13.0  --  12.1*  HCT 39.2  --  37.0*  PLT 257  --  214  HEPARINUNFRC  --   --  0.19*  CREATININE 0.90  --  0.98  TROPONINI  --  <0.03 <0.03   Assessment: 57 y.o. male with chest pain for heparin  Goal of Therapy:  Heparin level 0.3-0.7 units/ml Monitor platelets by anticoagulation protocol: Yes   Plan:  Heparin 2000 units IV bolus, then increase heparin 1200 units/hr Check heparin level in 6 hours.   Eddie Candle 01/01/2018,6:45 AM

## 2018-01-01 NOTE — Progress Notes (Signed)
ANTICOAGULATION CONSULT NOTE  Pharmacy Consult for heparin Indication: chest pain/ACS  No Known Allergies  Patient Measurements: Height: 5\' 10"  (177.8 cm) Weight: 154 lb (69.9 kg) IBW/kg (Calculated) : 73 Heparin Dosing Weight: 74.8 kg  Vital Signs: Temp: 97.9 F (36.6 C) (11/08 1159) Temp Source: Oral (11/08 1159) BP: 92/62 (11/08 1159) Pulse Rate: 62 (11/08 1159)  Labs: Recent Labs    12/31/17 2027 12/31/17 2334 01/01/18 0521 01/01/18 1118 01/01/18 1349  HGB 13.0  --  12.1*  --   --   HCT 39.2  --  37.0*  --   --   PLT 257  --  214  --   --   HEPARINUNFRC  --   --  0.19*  --  0.28*  CREATININE 0.90  --  0.98  --   --   TROPONINI  --  <0.03 <0.03 <0.03  --    Assessment: 57 y.o. male with chest pain for heparin - not on anticoag.   Heparin level came back slightly subtherapeutic at 0.28, on 1200 units/hr. Hgb 12.1, plt 214. No s/sx of bleeding. No infusion issues.  Goal of Therapy:  Heparin level 0.3-0.7 units/ml Monitor platelets by anticoagulation protocol: Yes   Plan:  Increase heparin infusion to 1300 units/hr Monitor daily HL, CBC, for s/sx of bleeding Continue heparin infusion for 48 hours total  Girard Cooter, PharmD Clinical Pharmacist  Pager: [phone] Phone: [phone] 01/01/2018,3:06 PM

## 2018-01-02 ENCOUNTER — Encounter: Payer: Self-pay | Admitting: Physician Assistant

## 2018-01-02 DIAGNOSIS — Z955 Presence of coronary angioplasty implant and graft: Secondary | ICD-10-CM

## 2018-01-02 DIAGNOSIS — Z9119 Patient's noncompliance with other medical treatment and regimen: Secondary | ICD-10-CM

## 2018-01-02 DIAGNOSIS — I25110 Atherosclerotic heart disease of native coronary artery with unstable angina pectoris: Principal | ICD-10-CM

## 2018-01-02 DIAGNOSIS — I959 Hypotension, unspecified: Secondary | ICD-10-CM

## 2018-01-02 LAB — CBC
HCT: 35.8 % — ABNORMAL LOW (ref 39.0–52.0)
Hemoglobin: 11.6 g/dL — ABNORMAL LOW (ref 13.0–17.0)
MCH: 32 pg (ref 26.0–34.0)
MCHC: 32.4 g/dL (ref 30.0–36.0)
MCV: 98.9 fL (ref 80.0–100.0)
PLATELETS: 187 10*3/uL (ref 150–400)
RBC: 3.62 MIL/uL — ABNORMAL LOW (ref 4.22–5.81)
RDW: 13.2 % (ref 11.5–15.5)
WBC: 6.9 10*3/uL (ref 4.0–10.5)
nRBC: 0 % (ref 0.0–0.2)

## 2018-01-02 LAB — HEPARIN LEVEL (UNFRACTIONATED): HEPARIN UNFRACTIONATED: 0.36 [IU]/mL (ref 0.30–0.70)

## 2018-01-02 MED ORDER — ATORVASTATIN CALCIUM 80 MG PO TABS: 80.0000 mg | ORAL_TABLET | Freq: Every day | ORAL | 4 refills | Status: DC

## 2018-01-02 MED ORDER — NITROGLYCERIN 0.4 MG SL SUBL: 0.4000 mg | SUBLINGUAL_TABLET | SUBLINGUAL | 0 refills | Status: DC | PRN

## 2018-01-02 MED ORDER — NICOTINE 14 MG/24HR TD PT24: 14.0000 mg | MEDICATED_PATCH | Freq: Every day | TRANSDERMAL | 0 refills | Status: DC

## 2018-01-02 MED ORDER — CLOPIDOGREL BISULFATE 75 MG PO TABS: 75.0000 mg | ORAL_TABLET | Freq: Every day | ORAL | 4 refills | Status: DC

## 2018-01-02 MED ORDER — ASPIRIN 81 MG PO TBEC: 81.0000 mg | DELAYED_RELEASE_TABLET | Freq: Every day | ORAL | Status: DC

## 2018-01-02 MED ORDER — METOPROLOL TARTRATE 25 MG PO TABS: 12.5000 mg | ORAL_TABLET | Freq: Two times a day (BID) | ORAL | 4 refills | Status: DC

## 2018-01-02 MED ORDER — ISOSORBIDE MONONITRATE ER 30 MG PO TB24: 15.0000 mg | ORAL_TABLET | Freq: Every day | ORAL | 4 refills | Status: DC

## 2018-01-02 NOTE — Progress Notes (Signed)
Progress Note  Patient Name: Connor Baker Date of Encounter: 01/02/2018  Primary Cardiologist: Verne Carrow, MD   Subjective   He denies chest pain, palpitations, and shortness of breath.  He has not had any chest pains for the past 36 hours.  Inpatient Medications    Scheduled Meds: . aspirin EC  81 mg Oral Daily  . atorvastatin  80 mg Oral q1800  . clopidogrel  75 mg Oral Daily  . isosorbide mononitrate  15 mg Oral Daily  . metoprolol tartrate  12.5 mg Oral BID  . nicotine  14 mg Transdermal Daily   Continuous Infusions: . heparin 1,300 Units/hr (01/01/18 1722)   PRN Meds: acetaminophen, morphine injection, nitroGLYCERIN, ondansetron (ZOFRAN) IV   Vital Signs    Vitals:   01/01/18 2148 01/02/18 0008 01/02/18 0453 01/02/18 0820  BP: (!) 89/60 94/61 94/62  92/60  Pulse: (!) 53 (!) 59 60 (!) 56  Resp: 15     Temp:  (!) 97.5 F (36.4 C) 98.1 F (36.7 C) 98.2 F (36.8 C)  TempSrc:  Oral Oral Oral  SpO2:  98% 98% 97%  Weight:   70.9 kg   Height:        Intake/Output Summary (Last 24 hours) at 01/02/2018 1049 Last data filed at 01/02/2018 0821 Gross per 24 hour  Intake 465.85 ml  Output 1700 ml  Net -1234.15 ml   Filed Weights   12/31/17 2310 01/01/18 0536 01/02/18 0453  Weight: 73.7 kg 69.9 kg 70.9 kg    Telemetry    Sinus bradycardia- Personally Reviewed  ECG    Sinus bradycardia- Personally Reviewed  Physical Exam   GEN: No acute distress.   Neck: No JVD Cardiac: RRR, no murmurs, rubs, or gallops.  Respiratory:  Bilateral rhonchorous breath sounds, no crackles. GI: Soft, nontender, non-distended  MS: No edema; No deformity. Neuro:  Nonfocal  Psych: Normal affect   Labs    Chemistry Recent Labs  Lab 12/31/17 2027 12/31/17 2057 01/01/18 0521  NA 137  --  140  K 3.3*  --  3.5  CL 103  --  109  CO2 25  --  26  GLUCOSE 112*  --  77  BUN 6  --  7  CREATININE 0.90  --  0.98  CALCIUM 8.9  --  8.6*  PROT  --  6.3*  --   ALBUMIN   --  3.6  --   AST  --  19  --   ALT  --  10  --   ALKPHOS  --  72  --   BILITOT  --  0.5  --   GFRNONAA >60  --  >60  GFRAA >60  --  >60  ANIONGAP 9  --  5     Hematology Recent Labs  Lab 12/31/17 2027 01/01/18 0521 01/02/18 0344  WBC 7.0 7.1 6.9  RBC 3.99* 3.80* 3.62*  HGB 13.0 12.1* 11.6*  HCT 39.2 37.0* 35.8*  MCV 98.2 97.4 98.9  MCH 32.6 31.8 32.0  MCHC 33.2 32.7 32.4  RDW 12.9 13.2 13.2  PLT 257 214 187    Cardiac Enzymes Recent Labs  Lab 12/31/17 2334 01/01/18 0521 01/01/18 1118  TROPONINI <0.03 <0.03 <0.03    Recent Labs  Lab 12/31/17 2031  TROPIPOC 0.00     BNPNo results for input(s): BNP, PROBNP in the last 168 hours.   DDimer No results for input(s): DDIMER in the last 168 hours.   Radiology  Dg Chest 2 View  Result Date: 12/31/2017 CLINICAL DATA:  Chest pain.  Shortness of breath. EXAM: CHEST - 2 VIEW COMPARISON:  September 08, 2017 FINDINGS: The heart size and mediastinal contours are within normal limits. Both lungs are clear. The visualized skeletal structures are unremarkable. IMPRESSION: No active cardiopulmonary disease. Electronically Signed   By: Gerome Sam III M.D   On: 12/31/2017 21:04    Cardiac Studies   Cardiac catheterization 01/01/17:   1st RPLB lesion, 100 %stenosed.  Post Atrio-1 lesion, 50 %stenosed.  Mid RCA to Dist RCA lesion, 5 %stenosed.  Post Atrio-2 lesion, 50 %stenosed.  Ost Cx lesion, 25 %stenosed.  Mid RCA lesion, 30 %stenosed.  Ost LAD to Prox LAD lesion, 40 %stenosed.  The left ventricular systolic function is normal.  LV end diastolic pressure is normal.  The left ventricular ejection fraction is 55-65% by visual estimate.  1. Single vessel obstructive CAD with chronic occlusion of a very small PL branch with left to right collaterals. The prior PTCA site in the proximal PLOM is unchanged from prior with a 50% stenosis. No new disease to explain his ongoing chest pain 2. Normal LV function 3.  Normal LVEDP  Plan: I do not see any culprit lesion for his recurrent chest pain. I question whether his pain is anginal. I would continue medical therapy.   Cardiac catheterization 08/28/16:   Evidence of multiple distal right coronary stents including stent with in-stent. Patient bolus of having 16 prior stents. No evidence of stents in other locations in the distal right coronary when evaluated by cine fluoroscopy.  Distal RCA ISR, 85% within a region that has at least 2 layers of stent. Total occlusion of a left ventricular branches supplied by left to right collaterals.  Widely patent left main.  Heavily calcified proximal LAD without significant obstruction.  Calcified eccentric 35-40% ostial circumflex.  Scoring balloon angioplasty and high pressure angioplasty of the 85% in-stent restenosis, reduced to 50%. Additional stent implantation was not performed. TIMI grade 3 flow noted post procedure.  RECOMMENDATIONS:   Medical therapy as prescribed on admission.  If he will be receiving care at [facility] in the future, we should gather as much information concerning prior procedures as possible ([facility], [facility] in [place], [facility], and possibly other facilities).  Chest discomfort experienced during PCI above was dissimilar to presenting complaint.  Patient Profile     57 y.o. male with history ofcoronary artery disease with multiple prior percutaneous coronary interventions to the right coronary artery (most recent LHC 09/2016 with CTO of RPL branch and L->R collaterals), hypertension, hyperlipidemia, obstructive sleep apnea, who presents with acute onset chest discomfort consistent with unstable angina.The patient is at risk for progression of his coronary disease due to inability to afford medications and ongoing smoking.   Assessment & Plan    1.  Unstable angina with history of coronary artery disease and multiple interventions: Symptomatically  stable.  No episodes of chest pain for the last 36 hours.  Troponins remain normal. Has multiple stenting to RCA ([facility], [facility] in [place], [facility], and possibly other facilities) most recent cath from 09/2016 with no significant change. Prior cath in 08/2016 with in-stent restenosis with scoring balloon angioplasty and high pressure angioplasty of the 85% to distal RCA>>redunced to 50%. Recommendations at that time were for continued medical management . He was lost to follow up and has not taken his medications in over a year secondary to cost.  Continue aspirin, atorvastatin 80 mg, clopidogrel, low-dose Imdur 15 mg, and low-dose Lopressor 12.5 mg twice daily.  I will discontinue heparin.  2.  Hypotension: Blood pressure is low normal.  He is asymptomatic.  Continue low-dose Imdur and low-dose Lopressor as noted above.  3.  Hyperlipidemia: Continue high intensity statin therapy with atorvastatin 80 mg.  4.  Tobacco abuse: Ongoing use with cessation recommended.  5.  Noncompliance with medication regimen: He will need case management consultation prior to discharge.  Disposition: Discharged today.   For questions or updates, please contact [facility] Please consult [email or web address] for contact info under Cardiology/STEMI.      Signed, Prentice Docker, MD  01/02/2018, 10:49 AM

## 2018-01-02 NOTE — Progress Notes (Signed)
ANTICOAGULATION CONSULT NOTE  Pharmacy Consult for heparin Indication: chest pain/ACS  No Known Allergies  Patient Measurements: Height: 5\' 10"  (177.8 cm) Weight: 156 lb 6.4 oz (70.9 kg) IBW/kg (Calculated) : 73 Heparin Dosing Weight: 74.8 kg  Vital Signs: Temp: 98.2 F (36.8 C) (11/09 0820) Temp Source: Oral (11/09 0820) BP: 92/60 (11/09 0820) Pulse Rate: 56 (11/09 0820)  Labs: Recent Labs    12/31/17 2027 12/31/17 2334 01/01/18 0521 01/01/18 1118 01/01/18 1349 01/02/18 0344  HGB 13.0  --  12.1*  --   --  11.6*  HCT 39.2  --  37.0*  --   --  35.8*  PLT 257  --  214  --   --  187  HEPARINUNFRC  --   --  0.19*  --  0.28* 0.36  CREATININE 0.90  --  0.98  --   --   --   TROPONINI  --  <0.03 <0.03 <0.03  --   --    Assessment: 57 y.o. male with chest pain for heparin. Heparin level therapeutic, CBC stable. No plans for intervention at this time - heparin to be discontinued tonight after 48 hours of therapy.  Goal of Therapy:  Heparin level 0.3-0.7 units/ml Monitor platelets by anticoagulation protocol: Yes   Plan:  -Continue heparin 1300 units/hr until 2300 tonight -Pharmacy will sign off, reconsult if further heparin needed  Fredonia Highland, PharmD, BCPS Clinical Pharmacist [phone] Please check AMION for all [facility] Pharmacy numbers 01/02/2018
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
## 2018-03-08 ENCOUNTER — Emergency Department (HOSPITAL_COMMUNITY)
Admission: EM | Admit: 2018-03-08 | Discharge: 2018-03-09 | Disposition: A | Discharge status: Home / Self Care | Payer: Medicaid Other | Attending: Emergency Medicine | Admitting: Emergency Medicine

## 2018-03-08 ENCOUNTER — Encounter (HOSPITAL_COMMUNITY): Payer: Self-pay | Admitting: Emergency Medicine

## 2018-03-08 ENCOUNTER — Emergency Department (HOSPITAL_COMMUNITY): Payer: Medicaid Other

## 2018-03-08 DIAGNOSIS — Z5321 Procedure and treatment not carried out due to patient leaving prior to being seen by health care provider: Secondary | ICD-10-CM | POA: Diagnosis not present

## 2018-03-08 DIAGNOSIS — R079 Chest pain, unspecified: Secondary | ICD-10-CM | POA: Diagnosis not present

## 2018-03-08 LAB — BASIC METABOLIC PANEL
Anion gap: 10 (ref 5–15)
BUN: 9 mg/dL (ref 6–20)
CO2: 23 mmol/L (ref 22–32)
Calcium: 8.8 mg/dL — ABNORMAL LOW (ref 8.9–10.3)
Chloride: 101 mmol/L (ref 98–111)
Creatinine, Ser: 0.9 mg/dL (ref 0.61–1.24)
GFR calc Af Amer: >60 mL/min (ref >60)
GFR calc non Af Amer: >60 mL/min (ref >60)
Glucose, Bld: 128 mg/dL — ABNORMAL HIGH (ref 70–99)
Potassium: 3.7 mmol/L (ref 3.5–5.1)
Sodium: 134 mmol/L — ABNORMAL LOW (ref 135–145)

## 2018-03-08 LAB — I-STAT TROPONIN, ED: TROPONIN I, POC: 0 ng/mL (ref 0.00–0.08)

## 2018-03-08 LAB — CBC
HCT: 44.4 % (ref 39.0–52.0)
Hemoglobin: 15.1 g/dL (ref 13.0–17.0)
MCH: 33 pg (ref 26.0–34.0)
MCHC: 34 g/dL (ref 30.0–36.0)
MCV: 97.2 fL (ref 80.0–100.0)
Platelets: 206 10*3/uL (ref 150–400)
RBC: 4.57 MIL/uL (ref 4.22–5.81)
RDW: 13 % (ref 11.5–15.5)
WBC: 8.8 10*3/uL (ref 4.0–10.5)
nRBC: 0 % (ref 0.0–0.2)

## 2018-03-08 NOTE — ED Triage Notes (Signed)
Pt reports midsternal chest pain that has gradually gotten worse over the last hour. Reports nausea and dizziness. 7/10

## 2018-03-09 NOTE — ED Notes (Signed)
No Answer 2x. Called for vitals once. Called again when finished collecting vitals with no answer.

## 2018-05-05 ENCOUNTER — Other Ambulatory Visit: Payer: Self-pay

## 2018-05-05 ENCOUNTER — Emergency Department (HOSPITAL_COMMUNITY): Payer: Medicaid Other

## 2018-05-05 ENCOUNTER — Encounter (HOSPITAL_COMMUNITY): Payer: Self-pay | Admitting: Emergency Medicine

## 2018-05-05 ENCOUNTER — Emergency Department (HOSPITAL_COMMUNITY)
Admission: EM | Admit: 2018-05-05 | Discharge: 2018-05-05 | Disposition: A | Discharge status: Home / Self Care | Payer: Medicaid Other | Attending: Emergency Medicine | Admitting: Emergency Medicine

## 2018-05-05 DIAGNOSIS — S3993XA Unspecified injury of pelvis, initial encounter: Secondary | ICD-10-CM | POA: Diagnosis not present

## 2018-05-05 DIAGNOSIS — S63501A Unspecified sprain of right wrist, initial encounter: Secondary | ICD-10-CM | POA: Diagnosis not present

## 2018-05-05 DIAGNOSIS — R9431 Abnormal electrocardiogram [ECG] [EKG]: Secondary | ICD-10-CM | POA: Diagnosis not present

## 2018-05-05 DIAGNOSIS — S6991XA Unspecified injury of right wrist, hand and finger(s), initial encounter: Secondary | ICD-10-CM | POA: Diagnosis not present

## 2018-05-05 DIAGNOSIS — T07XXXA Unspecified multiple injuries, initial encounter: Secondary | ICD-10-CM | POA: Diagnosis not present

## 2018-05-05 DIAGNOSIS — M25531 Pain in right wrist: Secondary | ICD-10-CM | POA: Diagnosis not present

## 2018-05-05 DIAGNOSIS — S63509A Unspecified sprain of unspecified wrist, initial encounter: Secondary | ICD-10-CM

## 2018-05-05 DIAGNOSIS — Y92410 Unspecified street and highway as the place of occurrence of the external cause: Secondary | ICD-10-CM | POA: Insufficient documentation

## 2018-05-05 DIAGNOSIS — Y998 Other external cause status: Secondary | ICD-10-CM | POA: Diagnosis not present

## 2018-05-05 DIAGNOSIS — S199XXA Unspecified injury of neck, initial encounter: Secondary | ICD-10-CM | POA: Diagnosis not present

## 2018-05-05 DIAGNOSIS — S39012A Strain of muscle, fascia and tendon of lower back, initial encounter: Secondary | ICD-10-CM | POA: Insufficient documentation

## 2018-05-05 DIAGNOSIS — W19XXXA Unspecified fall, initial encounter: Secondary | ICD-10-CM | POA: Diagnosis not present

## 2018-05-05 DIAGNOSIS — I10 Essential (primary) hypertension: Secondary | ICD-10-CM | POA: Diagnosis not present

## 2018-05-05 DIAGNOSIS — S0990XA Unspecified injury of head, initial encounter: Secondary | ICD-10-CM

## 2018-05-05 DIAGNOSIS — S022XXA Fracture of nasal bones, initial encounter for closed fracture: Secondary | ICD-10-CM | POA: Diagnosis not present

## 2018-05-05 DIAGNOSIS — S161XXA Strain of muscle, fascia and tendon at neck level, initial encounter: Secondary | ICD-10-CM | POA: Diagnosis not present

## 2018-05-05 DIAGNOSIS — S299XXA Unspecified injury of thorax, initial encounter: Secondary | ICD-10-CM | POA: Diagnosis not present

## 2018-05-05 DIAGNOSIS — Y939 Activity, unspecified: Secondary | ICD-10-CM | POA: Diagnosis not present

## 2018-05-05 DIAGNOSIS — S3992XA Unspecified injury of lower back, initial encounter: Secondary | ICD-10-CM | POA: Diagnosis not present

## 2018-05-05 DIAGNOSIS — S0081XA Abrasion of other part of head, initial encounter: Secondary | ICD-10-CM | POA: Diagnosis not present

## 2018-05-05 DIAGNOSIS — S3991XA Unspecified injury of abdomen, initial encounter: Secondary | ICD-10-CM | POA: Diagnosis not present

## 2018-05-05 LAB — BASIC METABOLIC PANEL
Anion gap: 8 (ref 5–15)
BUN: 7 mg/dL (ref 6–20)
CO2: 24 mmol/L (ref 22–32)
Calcium: 8.7 mg/dL — ABNORMAL LOW (ref 8.9–10.3)
Chloride: 106 mmol/L (ref 98–111)
Creatinine, Ser: 0.94 mg/dL (ref 0.61–1.24)
GFR calc Af Amer: >60 mL/min (ref >60)
GFR calc non Af Amer: >60 mL/min (ref >60)
Glucose, Bld: 100 mg/dL — ABNORMAL HIGH (ref 70–99)
Potassium: 3.8 mmol/L (ref 3.5–5.1)
SODIUM: 138 mmol/L (ref 135–145)

## 2018-05-05 LAB — CBC WITH DIFFERENTIAL/PLATELET
ABS IMMATURE GRANULOCYTES: 0.01 10*3/uL (ref 0.00–0.07)
BASOS PCT: 1 %
Basophils Absolute: 0.1 10*3/uL (ref 0.0–0.1)
Eosinophils Absolute: 0.1 10*3/uL (ref 0.0–0.5)
Eosinophils Relative: 2 %
HCT: 46.2 % (ref 39.0–52.0)
Hemoglobin: 15.6 g/dL (ref 13.0–17.0)
Immature Granulocytes: 0 %
Lymphocytes Relative: 26 %
Lymphs Abs: 1.4 10*3/uL (ref 0.7–4.0)
MCH: 32.5 pg (ref 26.0–34.0)
MCHC: 33.8 g/dL (ref 30.0–36.0)
MCV: 96.3 fL (ref 80.0–100.0)
Monocytes Absolute: 0.4 10*3/uL (ref 0.1–1.0)
Monocytes Relative: 8 %
Neutro Abs: 3.4 10*3/uL (ref 1.7–7.7)
Neutrophils Relative %: 63 %
Platelets: 164 10*3/uL (ref 150–400)
RBC: 4.8 MIL/uL (ref 4.22–5.81)
RDW: 12.8 % (ref 11.5–15.5)
WBC: 5.3 10*3/uL (ref 4.0–10.5)
nRBC: 0 % (ref 0.0–0.2)

## 2018-05-05 MED ORDER — HYDROCODONE-ACETAMINOPHEN 5-325 MG PO TABS: 1.0000 | ORAL_TABLET | Freq: Four times a day (QID) | ORAL | 0 refills | Status: DC | PRN

## 2018-05-05 MED ORDER — IOHEXOL 300 MG/ML  SOLN
100.0000 mL | Freq: Once | INTRAMUSCULAR | Status: AC | PRN
  Administered 2018-05-05: 100 mL via INTRAVENOUS

## 2018-05-05 MED ORDER — MORPHINE SULFATE (PF) 4 MG/ML IV SOLN
4.0000 mg | Freq: Once | INTRAVENOUS | Status: AC
  Administered 2018-05-05: 4 mg via INTRAVENOUS
  Filled 2018-05-05: qty 1

## 2018-05-05 MED ORDER — ONDANSETRON HCL 4 MG/2ML IJ SOLN
4.0000 mg | Freq: Once | INTRAMUSCULAR | Status: AC
  Administered 2018-05-05: 4 mg via INTRAVENOUS
  Filled 2018-05-05: qty 2

## 2018-05-05 NOTE — ED Notes (Signed)
Pt verbalized understanding of discharge instructions and denies any further questions at this time.   

## 2018-05-05 NOTE — ED Notes (Signed)
ED Provider at bedside. 

## 2018-05-05 NOTE — Discharge Instructions (Addendum)
Hydrocodone as prescribed as needed for pain.  Apply bacitracin to your abrasions several times daily to prevent them from drying out.  Return to the emergency department if you develop increased pain, redness, severe headache, or other new and concerning symptoms.

## 2018-05-05 NOTE — ED Notes (Signed)
EDP notified of pts request for pain meds.

## 2018-05-05 NOTE — ED Notes (Signed)
Patient transported to CT 
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
## 2018-05-06 ENCOUNTER — Encounter (HOSPITAL_COMMUNITY): Payer: Self-pay | Admitting: Emergency Medicine

## 2018-05-10 ENCOUNTER — Ambulatory Visit (INDEPENDENT_AMBULATORY_CARE_PROVIDER_SITE_OTHER): Payer: Medicaid Other

## 2018-05-10 ENCOUNTER — Other Ambulatory Visit: Payer: Self-pay

## 2018-05-10 ENCOUNTER — Ambulatory Visit (HOSPITAL_COMMUNITY)
Admission: EM | Admit: 2018-05-10 | Discharge: 2018-05-10 | Disposition: A | Discharge status: Home / Self Care | Payer: Medicaid Other | Attending: Family Medicine | Admitting: Family Medicine

## 2018-05-10 DIAGNOSIS — L089 Local infection of the skin and subcutaneous tissue, unspecified: Secondary | ICD-10-CM | POA: Diagnosis not present

## 2018-05-10 DIAGNOSIS — S80211A Abrasion, right knee, initial encounter: Secondary | ICD-10-CM

## 2018-05-10 DIAGNOSIS — S0031XA Abrasion of nose, initial encounter: Secondary | ICD-10-CM | POA: Diagnosis not present

## 2018-05-10 DIAGNOSIS — S8991XA Unspecified injury of right lower leg, initial encounter: Secondary | ICD-10-CM | POA: Diagnosis not present

## 2018-05-10 DIAGNOSIS — S8001XA Contusion of right knee, initial encounter: Secondary | ICD-10-CM | POA: Diagnosis not present

## 2018-05-10 DIAGNOSIS — T148XXA Other injury of unspecified body region, initial encounter: Secondary | ICD-10-CM

## 2018-05-10 DIAGNOSIS — S161XXA Strain of muscle, fascia and tendon at neck level, initial encounter: Secondary | ICD-10-CM

## 2018-05-10 DIAGNOSIS — F17210 Nicotine dependence, cigarettes, uncomplicated: Secondary | ICD-10-CM

## 2018-05-10 DIAGNOSIS — M7918 Myalgia, other site: Secondary | ICD-10-CM

## 2018-05-10 MED ORDER — CYCLOBENZAPRINE HCL 5 MG PO TABS: 5.0000 mg | ORAL_TABLET | Freq: Two times a day (BID) | ORAL | 0 refills | Status: DC | PRN

## 2018-05-10 MED ORDER — DEXAMETHASONE SODIUM PHOSPHATE 10 MG/ML IJ SOLN
INTRAMUSCULAR | Status: AC
  Filled 2018-05-10: qty 1

## 2018-05-10 MED ORDER — DEXAMETHASONE SODIUM PHOSPHATE 10 MG/ML IJ SOLN
10.0000 mg | Freq: Once | INTRAMUSCULAR | Status: AC
  Administered 2018-05-10: 10 mg via INTRAMUSCULAR

## 2018-05-10 MED ORDER — CEPHALEXIN 500 MG PO CAPS: 500.0000 mg | ORAL_CAPSULE | Freq: Four times a day (QID) | ORAL | 0 refills | Status: AC
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
## 2018-05-10 NOTE — Discharge Instructions (Signed)
We gave you decadron for your headache/muscle pains Continue Tylenol for pain, Hydrocodone provided by ED for severe pain You may use flexeril as needed to help with pain. This is a muscle relaxer and causes sedation- please use only at bedtime or when you will be home and not have to drive/work Alternate ice and heat to various areas- neck, back, knee Begin Keflex 4 times daily for the next week to treat infection related to wounds Keep wounds clean and dry- wash with warm soapy water daily  Follow up if not improving, worsening, headaches persisting, developing changes in vision, weakness

## 2018-05-11 MED FILL — CYCLOBENZAPRINE 5 MG TABLET: 5 | 6 days supply | Qty: 24 | Fill #0

## 2018-05-11 MED FILL — CEPHALEXIN 500 MG CAPSULE: 500 | 7 days supply | Qty: 28 | Fill #0
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
## 2018-05-18 ENCOUNTER — Telehealth (INDEPENDENT_AMBULATORY_CARE_PROVIDER_SITE_OTHER): Payer: Self-pay

## 2018-05-18 NOTE — Telephone Encounter (Signed)
I called to screen the patient for covid-19 prior to tomorrow's appointment with Dr. Prince Rome. I reached his voice mail. I left the questions on his voice mail and advised him he would be asked those questions at the door tomorrow, along with having his temperature taken, before entering the building.

## 2018-05-19 ENCOUNTER — Ambulatory Visit (INDEPENDENT_AMBULATORY_CARE_PROVIDER_SITE_OTHER): Payer: Self-pay | Admitting: Family Medicine

## 2018-12-21 ENCOUNTER — Ambulatory Visit (HOSPITAL_COMMUNITY)
Admission: EM | Admit: 2018-12-21 | Discharge: 2018-12-21 | Discharge status: Against Medical Advice | Payer: Medicaid Other | Attending: Emergency Medicine | Admitting: Emergency Medicine

## 2018-12-21 NOTE — ED Notes (Signed)
Pt was called 3 times and I looked for him in the waiting room area and pt did not answered.

## 2018-12-27 ENCOUNTER — Other Ambulatory Visit: Payer: Self-pay

## 2018-12-27 ENCOUNTER — Ambulatory Visit (HOSPITAL_COMMUNITY)
Admission: EM | Admit: 2018-12-27 | Discharge: 2018-12-27 | Disposition: A | Discharge status: Home / Self Care | Payer: Medicaid Other | Attending: Emergency Medicine | Admitting: Emergency Medicine

## 2018-12-27 ENCOUNTER — Encounter (HOSPITAL_COMMUNITY): Payer: Self-pay

## 2018-12-27 ENCOUNTER — Ambulatory Visit (INDEPENDENT_AMBULATORY_CARE_PROVIDER_SITE_OTHER): Payer: Medicaid Other

## 2018-12-27 DIAGNOSIS — I509 Heart failure, unspecified: Secondary | ICD-10-CM | POA: Diagnosis not present

## 2018-12-27 DIAGNOSIS — Z79899 Other long term (current) drug therapy: Secondary | ICD-10-CM | POA: Insufficient documentation

## 2018-12-27 DIAGNOSIS — I252 Old myocardial infarction: Secondary | ICD-10-CM | POA: Insufficient documentation

## 2018-12-27 DIAGNOSIS — Z955 Presence of coronary angioplasty implant and graft: Secondary | ICD-10-CM | POA: Diagnosis not present

## 2018-12-27 DIAGNOSIS — G4733 Obstructive sleep apnea (adult) (pediatric): Secondary | ICD-10-CM | POA: Diagnosis not present

## 2018-12-27 DIAGNOSIS — J3489 Other specified disorders of nose and nasal sinuses: Secondary | ICD-10-CM | POA: Diagnosis not present

## 2018-12-27 DIAGNOSIS — Z8673 Personal history of transient ischemic attack (TIA), and cerebral infarction without residual deficits: Secondary | ICD-10-CM | POA: Insufficient documentation

## 2018-12-27 DIAGNOSIS — J22 Unspecified acute lower respiratory infection: Secondary | ICD-10-CM | POA: Diagnosis not present

## 2018-12-27 DIAGNOSIS — Z20828 Contact with and (suspected) exposure to other viral communicable diseases: Secondary | ICD-10-CM | POA: Insufficient documentation

## 2018-12-27 DIAGNOSIS — Z7982 Long term (current) use of aspirin: Secondary | ICD-10-CM | POA: Insufficient documentation

## 2018-12-27 DIAGNOSIS — R05 Cough: Secondary | ICD-10-CM

## 2018-12-27 DIAGNOSIS — Z8249 Family history of ischemic heart disease and other diseases of the circulatory system: Secondary | ICD-10-CM | POA: Diagnosis not present

## 2018-12-27 DIAGNOSIS — I700 Atherosclerosis of aorta: Secondary | ICD-10-CM | POA: Insufficient documentation

## 2018-12-27 DIAGNOSIS — I110 Hypertensive heart disease with heart failure: Secondary | ICD-10-CM | POA: Insufficient documentation

## 2018-12-27 DIAGNOSIS — F17210 Nicotine dependence, cigarettes, uncomplicated: Secondary | ICD-10-CM | POA: Insufficient documentation

## 2018-12-27 DIAGNOSIS — J449 Chronic obstructive pulmonary disease, unspecified: Secondary | ICD-10-CM | POA: Insufficient documentation

## 2018-12-27 DIAGNOSIS — J029 Acute pharyngitis, unspecified: Secondary | ICD-10-CM | POA: Diagnosis not present

## 2018-12-27 DIAGNOSIS — I2510 Atherosclerotic heart disease of native coronary artery without angina pectoris: Secondary | ICD-10-CM | POA: Insufficient documentation

## 2018-12-27 DIAGNOSIS — R351 Nocturia: Secondary | ICD-10-CM | POA: Insufficient documentation

## 2018-12-27 DIAGNOSIS — M1990 Unspecified osteoarthritis, unspecified site: Secondary | ICD-10-CM | POA: Insufficient documentation

## 2018-12-27 DIAGNOSIS — Z7902 Long term (current) use of antithrombotics/antiplatelets: Secondary | ICD-10-CM | POA: Diagnosis not present

## 2018-12-27 DIAGNOSIS — R059 Cough, unspecified: Secondary | ICD-10-CM

## 2018-12-27 DIAGNOSIS — K219 Gastro-esophageal reflux disease without esophagitis: Secondary | ICD-10-CM | POA: Diagnosis not present

## 2018-12-27 DIAGNOSIS — E7800 Pure hypercholesterolemia, unspecified: Secondary | ICD-10-CM | POA: Insufficient documentation

## 2018-12-27 LAB — CBC
HCT: 48.5 % (ref 39.0–52.0)
Hemoglobin: 16.8 g/dL (ref 13.0–17.0)
MCH: 34.3 pg — ABNORMAL HIGH (ref 26.0–34.0)
MCHC: 34.6 g/dL (ref 30.0–36.0)
MCV: 99 fL (ref 80.0–100.0)
Platelets: 258 10*3/uL (ref 150–400)
RBC: 4.9 MIL/uL (ref 4.22–5.81)
RDW: 12.8 % (ref 11.5–15.5)
WBC: 9.6 10*3/uL (ref 4.0–10.5)
nRBC: 0 % (ref 0.0–0.2)

## 2018-12-27 LAB — BASIC METABOLIC PANEL
Anion gap: 9 (ref 5–15)
BUN: 9 mg/dL (ref 6–20)
CO2: 28 mmol/L (ref 22–32)
Calcium: 9.3 mg/dL (ref 8.9–10.3)
Chloride: 101 mmol/L (ref 98–111)
Creatinine, Ser: 1.25 mg/dL — ABNORMAL HIGH (ref 0.61–1.24)
GFR calc Af Amer: >60 mL/min (ref >60)
GFR calc non Af Amer: >60 mL/min (ref >60)
Glucose, Bld: 93 mg/dL (ref 70–99)
Potassium: 4 mmol/L (ref 3.5–5.1)
Sodium: 138 mmol/L (ref 135–145)

## 2018-12-27 LAB — BRAIN NATRIURETIC PEPTIDE: B Natriuretic Peptide: 44.8 pg/mL (ref 0.0–100.0)

## 2018-12-27 LAB — POCT RAPID STREP A: Streptococcus, Group A Screen (Direct): NEGATIVE

## 2018-12-27 MED ORDER — BENZONATATE 200 MG PO CAPS: 200.0000 mg | ORAL_CAPSULE | Freq: Three times a day (TID) | ORAL | 0 refills | Status: DC | PRN

## 2018-12-27 MED ORDER — PREDNISONE 20 MG PO TABS: 40.0000 mg | ORAL_TABLET | Freq: Every day | ORAL | 0 refills | Status: AC

## 2018-12-27 MED ORDER — ALBUTEROL SULFATE HFA 108 (90 BASE) MCG/ACT IN AERS: 1.0000 | INHALATION_SPRAY | Freq: Four times a day (QID) | RESPIRATORY_TRACT | 0 refills | Status: DC | PRN

## 2018-12-27 MED ORDER — AEROCHAMBER PLUS MISC: 2 refills | Status: DC

## 2018-12-27 MED ORDER — FLUTICASONE PROPIONATE 50 MCG/ACT NA SUSP: 2.0000 | Freq: Every day | NASAL | 0 refills | Status: DC

## 2018-12-27 NOTE — Discharge Instructions (Addendum)
I am treating this as if this is a lower respiratory tract infection with a component of COPD.  I will call you if your labs come back abnormal.  Your chest x-ray was negative for pneumonia or congestive heart failure today.  2 puffs from your albuterol inhaler using your spacer every 4 hours for 2 days, then every 6 hours for 2 days, then you use it as needed.  Prednisone will help with inflammation in the lungs.  Tessalon for the cough, Flonase, Mucinex, saline nasal irrigation the NeilMed rinse and distilled water as often as you want for the nasal congestion.

## 2018-12-27 NOTE — ED Provider Notes (Addendum)
HPI  SUBJECTIVE:  Connor Baker is a 58 y.o. male who presents with 5 days of cough occasionally productive of white-gray phlegm, shortness of breath only with coughing, chest soreness secondary to the cough.  He reports sore throat from the coughing, rhinorrhea, headaches.  States that his GERD is bothering him.  He denies fevers, wheezing, and dyspnea on exertion.  No nasal congestion, sinus pain or pressure, allergy type symptoms.  No loss of sense of smell or taste, nausea, vomiting, abdominal pain, diarrhea.  No known Covid exposure.  He reports intermittent lower extremity edema, nocturia, PND during this time.  No unintentional weight gain, orthopnea.  No antibiotics in the past 3 months.  No antipyretic in the past 4 to 6 hours.  He has tried aspirin, Tylenol, cough syrup, cough drops without improvement in his symptoms.  His cough is worse with lying down.  He has a past medical history of hypertension, hypercholesterolemia, coronary artery disease, CHF, stroke on Plavix, MI, GERD.  He is a smoker.  No formal diagnosis of asthma, emphysema COPD.  [id number], Veleta Miners, DO   Past Medical History:  Diagnosis Date  . Arthritis    "legs" (08/28/2016)  . CHF (congestive heart failure) (HCC)   . Chronic lower back pain   . Coronary artery disease   . Depression   . GERD (gastroesophageal reflux disease)   . High cholesterol   . Hypertension   . MI (myocardial infarction) (HCC) 1992; 1994; 1995  . On home oxygen therapy    "4L just at night w/my CPAP" (08/28/2016)  . OSA (obstructive sleep apnea)    w/oxygen" (08/28/2016)  . Stroke [facility]) 2005   "mild one; faded my memory" (08/28/2016)    Past Surgical History:  Procedure Laterality Date  . ANKLE FRACTURE SURGERY Right 2016  . CORONARY ANGIOPLASTY    . CORONARY ANGIOPLASTY WITH STENT PLACEMENT  1992 - 2017   "I've got a total of 16 stents in me" (08/28/2016)  . CORONARY BALLOON ANGIOPLASTY Right 08/28/2016   Procedure: Coronary Balloon  Angioplasty;  Surgeon: Lyn Records, MD;  Location: [facility] INVASIVE CV LAB;  Service: Cardiovascular;  Laterality: Right;  . FRACTURE SURGERY    . LEFT HEART CATH AND CORONARY ANGIOGRAPHY N/A 08/28/2016   Procedure: Left Heart Cath and Coronary Angiography;  Surgeon: Lyn Records, MD;  Location: [facility] INVASIVE CV LAB;  Service: Cardiovascular;  Laterality: N/A;  . LEFT HEART CATH AND CORONARY ANGIOGRAPHY N/A 10/24/2016   Procedure: LEFT HEART CATH AND CORONARY ANGIOGRAPHY;  Surgeon: Swazil[place], Peter M, MD;  Location: [facility] INVASIVE CV LAB;  Service: Cardiovascular;  Laterality: N/A;  . TOTAL HIP ARTHROPLASTY Right 1990    Family History  Problem Relation Age of Onset  . Heart attack Father   . Diabetes Mother     Social History   Tobacco Use  . Smoking status: Current Every Day Smoker    Packs/day: 1.00    Years: 1.00    Pack years: 1.00    Types: Cigarettes  . Smokeless tobacco: Never Used  . Tobacco comment: "quit smoking in the 1990s"/ started back smoking at first of 2019  Substance Use Topics  . Alcohol use: Not Currently    Comment: 08/28/2016 "stopped 10-15 yr ago"  . Drug use: Not Currently    No current facility-administered medications for this encounter.   Current Outpatient Medications:  .  acetaminophen (TYLENOL) 325 MG tablet, Take 650 mg by mouth every 6 (six) hours as  needed for mild pain., Disp: , Rfl:  .  albuterol (VENTOLIN HFA) 108 (90 Base) MCG/ACT inhaler, Inhale 1-2 puffs into the lungs every 6 (six) hours as needed for wheezing or shortness of breath., Disp: 18 g, Rfl: 0 .  aspirin EC 81 MG EC tablet, Take 1 tablet (81 mg total) by mouth daily., Disp: , Rfl:  .  atorvastatin (LIPITOR) 80 MG tablet, Take 1 tablet (80 mg total) by mouth daily at 6 PM., Disp: 90 tablet, Rfl: 4 .  benzonatate (TESSALON) 200 MG capsule, Take 1 capsule (200 mg total) by mouth 3 (three) times daily as needed for cough., Disp: 30 capsule, Rfl: 0 .  clopidogrel (PLAVIX) 75 MG tablet, Take 1  tablet (75 mg total) by mouth daily., Disp: 90 tablet, Rfl: 4 .  fluticasone (FLONASE) 50 MCG/ACT nasal spray, Place 2 sprays into both nostrils daily., Disp: 16 g, Rfl: 0 .  isosorbide mononitrate (IMDUR) 30 MG 24 hr tablet, Take 0.5 tablets (15 mg total) by mouth daily., Disp: 90 tablet, Rfl: 4 .  metoprolol tartrate (LOPRESSOR) 25 MG tablet, Take 0.5 tablets (12.5 mg total) by mouth 2 (two) times daily., Disp: 180 tablet, Rfl: 4 .  nicotine (NICODERM CQ - DOSED IN MG/24 HOURS) 14 mg/24hr patch, Place 1 patch (14 mg total) onto the skin daily., Disp: 28 patch, Rfl: 0 .  nitroGLYCERIN (NITROSTAT) 0.4 MG SL tablet, Place 1 tablet (0.4 mg total) under the tongue every 5 (five) minutes x 3 doses as needed for chest pain., Disp: 25 tablet, Rfl: 0 .  predniSONE (DELTASONE) 20 MG tablet, Take 2 tablets (40 mg total) by mouth daily with breakfast for 5 days., Disp: 10 tablet, Rfl: 0 .  Spacer/Aero-Holding Chambers (AEROCHAMBER PLUS) inhaler, Use as instructed, Disp: 1 each, Rfl: 2  No Known Allergies   ROS  As noted in HPI.   Physical Exam  BP 133/85 (BP Location: Left Arm)   Pulse 71   Temp 97.7 F (36.5 C) (Tympanic)   Resp 18   SpO2 97%   Constitutional: Well developed, well nourished, no acute distress Eyes: PERRL, EOMI, conjunctiva normal bilaterally HENT: Normocephalic, atraumatic,mucus membranes moist.  Positive nasal congestion.  No sinus tenderness.  Normal oropharynx, normal tonsils without exudates, uvula midline.  No postnasal drip. Respiratory: Wheezing left side, rhonchi at the base of the left lung. Cardiovascular: Normal rate and rhythm, no murmurs, no gallops, no rubs.  No JVD. GI: Soft, nondistended, normal bowel sounds, nontender, no rebound, no guarding.  No hepatomegaly. Back: no CVAT skin: No rash, skin intact Musculoskeletal: No lower extremity edema bilaterally, no tenderness, no deformities Neurologic: Alert & oriented x 3, CN III-XII grossly intact, no motor  deficits, sensation grossly intact Psychiatric: Speech and behavior appropriate   ED Course   Medications - No data to display  Orders Placed This Encounter  Procedures  . Novel Coronavirus, NAA (Hosp order, Send-out to Ref Lab; TAT 18-24 hrs    Standing Status:   Standing    Number of Occurrences:   1    Order Specific Question:   Is this test for diagnosis or screening    Answer:   Screening    Order Specific Question:   Symptomatic for COVID-19 as defined by CDC    Answer:   No    Order Specific Question:   Hospitalized for COVID-19    Answer:   No    Order Specific Question:   Admitted to ICU for COVID-19  Answer:   No    Order Specific Question:   Previously tested for COVID-19    Answer:   No    Order Specific Question:   Resident in a congregate (group) care setting    Answer:   No    Order Specific Question:   Employed in healthcare setting    Answer:   No  . Culture, group A strep (throat)    Standing Status:   Standing    Number of Occurrences:   1  . DG Chest 2 View    Standing Status:   Standing    Number of Occurrences:   1    Order Specific Question:   Reason for Exam (SYMPTOM  OR DIAGNOSIS REQUIRED)    Answer:   h/o CHF. ronchi wheezing L side, r/o PNA, effusion pulm edema  . CBC    Standing Status:   Standing    Number of Occurrences:   1  . Basic metabolic panel    Standing Status:   Standing    Number of Occurrences:   1  . Brain natriuretic peptide    Standing Status:   Standing    Number of Occurrences:   1  . POCT rapid strep [facility])    Standing Status:   Standing    Number of Occurrences:   1   Results for orders placed or performed during the hospital encounter of 12/27/18 (from the past 24 hour(s))  POCT rapid strep A [facility])     Status: None   Collection Time: 12/27/18 11:41 AM  Result Value Ref Range   Streptococcus, Group A Screen (Direct) NEGATIVE NEGATIVE  CBC     Status: Abnormal   Collection Time: 12/27/18  11:58 AM  Result Value Ref Range   WBC 9.6 4.0 - 10.5 K/uL   RBC 4.90 4.22 - 5.81 MIL/uL   Hemoglobin 16.8 13.0 - 17.0 g/dL   HCT 30.1 60.1 - 09.3 %   MCV 99.0 80.0 - 100.0 fL   MCH 34.3 (H) 26.0 - 34.0 pg   MCHC 34.6 30.0 - 36.0 g/dL   RDW 23.5 57.3 - 22.0 %   Platelets 258 150 - 400 K/uL   nRBC 0.0 0.0 - 0.2 %  Basic metabolic panel     Status: Abnormal   Collection Time: 12/27/18 11:58 AM  Result Value Ref Range   Sodium 138 135 - 145 mmol/L   Potassium 4.0 3.5 - 5.1 mmol/L   Chloride 101 98 - 111 mmol/L   CO2 28 22 - 32 mmol/L   Glucose, Bld 93 70 - 99 mg/dL   BUN 9 6 - 20 mg/dL   Creatinine, Ser 2.54 (H) 0.61 - 1.24 mg/dL   Calcium 9.3 8.9 - 27.0 mg/dL   GFR calc non Af Amer >60 >60 mL/min   GFR calc Af Amer >60 >60 mL/min   Anion gap 9 5 - 15  Brain natriuretic peptide     Status: None   Collection Time: 12/27/18 11:58 AM  Result Value Ref Range   B Natriuretic Peptide 44.8 0.0 - 100.0 pg/mL   Dg Chest 2 View  Result Date: 12/27/2018 CLINICAL DATA:  Cough since 12/24/2018. EXAM: CHEST - 2 VIEW COMPARISON:  PA and lateral chest 09/08/2017 and 03/08/2018. FINDINGS: The chest is hyperexpanded with mild-to-moderate peribronchial thickening, unchanged. No consolidative process, pneumothorax or effusion. Heart size is normal. Aortic atherosclerosis noted. No acute or focal bony abnormality. IMPRESSION: No acute  disease. Appearance of the chest compatible with COPD. Atherosclerosis. Electronically Signed   By: Inge Rise M.D.   On: 12/27/2018 12:12    ED Clinical Impression  1. Cough   2. LRTI (lower respiratory tract infection)      ED Assessment/Plan  Covid, URI, lower respiratory tract infection, CHF in the differential.  He has some symptoms of CHF, but does not appear fluid overloaded. no lower extremity edema and he has unilateral, not bilateral, abnormal breath sounds.  Will check chest x-ray to rule out pneumonia, evaluate for pleural effusion or pulmonary  edema.  Covid test sent.  Also checking BMP, CBC, BNP.  Will contact patient at [phone] if abnormal.  Reviewed imaging independently.  No acute cardiopulmonary disease.  COPD. See radiology report for full details.  Slightly elevated creatinine from before.  Otherwise CBC, BMP normal.  Left message on patient's voicemail to return call so that we can discuss his labs.  Patient with a lower respiratory tract infection, may have COPD.  Will send home with regularly scheduled albuterol with a spacer for the next 4 days, then as needed, Flonase, Mucinex, Tessalon, prednisone 40 mg for 5 days.  Follow-up with PMD in 3 days regardless of how he feels.  Gave him strict ER return precautions.  Discussed labs, imaging, MDM, treatment plan, and plan for follow-up with patient Discussed sn/sx that should prompt return to the ED. patient agrees with plan.   Addendum 12/28/2018, 1532: BNP normal.  Accidentally omitted on initial transcription.  Again called patient to try and discuss creatinine and otherwise normal labs and left voicemail.  Meds ordered this encounter  Medications  . albuterol (VENTOLIN HFA) 108 (90 Base) MCG/ACT inhaler    Sig: Inhale 1-2 puffs into the lungs every 6 (six) hours as needed for wheezing or shortness of breath.    Dispense:  18 g    Refill:  0  . fluticasone (FLONASE) 50 MCG/ACT nasal spray    Sig: Place 2 sprays into both nostrils daily.    Dispense:  16 g    Refill:  0  . Spacer/Aero-Holding Chambers (AEROCHAMBER PLUS) inhaler    Sig: Use as instructed    Dispense:  1 each    Refill:  2  . benzonatate (TESSALON) 200 MG capsule    Sig: Take 1 capsule (200 mg total) by mouth 3 (three) times daily as needed for cough.    Dispense:  30 capsule    Refill:  0  . predniSONE (DELTASONE) 20 MG tablet    Sig: Take 2 tablets (40 mg total) by mouth daily with breakfast for 5 days.    Dispense:  10 tablet    Refill:  0    *This clinic note was created using Geographical information systems officer. Therefore, there may be occasional mistakes despite careful proofreading.  ?    Melynda Ripple, MD 12/28/18 0630    Melynda Ripple, MD 12/28/18 1535

## 2018-12-27 NOTE — ED Triage Notes (Addendum)
Pt states when he lay down he feels the shortness of breath . Pt states he has a cough x 4 days. Pt states he is having chest pain when coughing. Pt states having sore throat x 4 days. Pt was taking aspirin and OTC cough drop without relieve.

## 2018-12-29 ENCOUNTER — Telehealth (HOSPITAL_COMMUNITY): Payer: Self-pay

## 2018-12-29 LAB — NOVEL CORONAVIRUS, NAA (HOSP ORDER, SEND-OUT TO REF LAB; TAT 18-24 HRS): SARS-CoV-2, NAA: NOT DETECTED

## 2018-12-29 LAB — CULTURE, GROUP A STREP (THRC)

## 2019-03-28 ENCOUNTER — Emergency Department (HOSPITAL_COMMUNITY): Payer: Medicaid Other

## 2019-03-28 ENCOUNTER — Encounter (HOSPITAL_COMMUNITY): Payer: Self-pay | Admitting: Emergency Medicine

## 2019-03-28 ENCOUNTER — Emergency Department (HOSPITAL_COMMUNITY)
Admission: EM | Admit: 2019-03-28 | Discharge: 2019-03-28 | Disposition: A | Discharge status: Home / Self Care | Payer: Medicaid Other | Attending: Emergency Medicine | Admitting: Emergency Medicine

## 2019-03-28 ENCOUNTER — Other Ambulatory Visit: Payer: Self-pay

## 2019-03-28 DIAGNOSIS — R079 Chest pain, unspecified: Secondary | ICD-10-CM | POA: Diagnosis not present

## 2019-03-28 DIAGNOSIS — R0602 Shortness of breath: Secondary | ICD-10-CM | POA: Diagnosis not present

## 2019-03-28 DIAGNOSIS — R1110 Vomiting, unspecified: Secondary | ICD-10-CM | POA: Insufficient documentation

## 2019-03-28 DIAGNOSIS — R05 Cough: Secondary | ICD-10-CM | POA: Diagnosis not present

## 2019-03-28 DIAGNOSIS — Z5321 Procedure and treatment not carried out due to patient leaving prior to being seen by health care provider: Secondary | ICD-10-CM | POA: Diagnosis not present

## 2019-03-28 LAB — TROPONIN I (HIGH SENSITIVITY): Troponin I (High Sensitivity): 7 ng/L (ref <18)

## 2019-03-28 LAB — CBC
HCT: 51.7 % (ref 39.0–52.0)
Hemoglobin: 17.9 g/dL — ABNORMAL HIGH (ref 13.0–17.0)
MCH: 33.8 pg (ref 26.0–34.0)
MCHC: 34.6 g/dL (ref 30.0–36.0)
MCV: 97.5 fL (ref 80.0–100.0)
Platelets: 252 10*3/uL (ref 150–400)
RBC: 5.3 MIL/uL (ref 4.22–5.81)
RDW: 12.6 % (ref 11.5–15.5)
WBC: 7.8 10*3/uL (ref 4.0–10.5)
nRBC: 0 % (ref 0.0–0.2)

## 2019-03-28 LAB — BASIC METABOLIC PANEL
Anion gap: 12 (ref 5–15)
BUN: 7 mg/dL (ref 6–20)
CO2: 27 mmol/L (ref 22–32)
Calcium: 10.5 mg/dL — ABNORMAL HIGH (ref 8.9–10.3)
Chloride: 102 mmol/L (ref 98–111)
Creatinine, Ser: 0.92 mg/dL (ref 0.61–1.24)
GFR calc Af Amer: >60 mL/min (ref >60)
GFR calc non Af Amer: >60 mL/min (ref >60)
Glucose, Bld: 110 mg/dL — ABNORMAL HIGH (ref 70–99)
Potassium: 3.9 mmol/L (ref 3.5–5.1)
Sodium: 141 mmol/L (ref 135–145)

## 2019-03-28 LAB — PROTIME-INR
INR: 1 (ref 0.8–1.2)
Prothrombin Time: 12.8 seconds (ref 11.4–15.2)

## 2019-03-28 MED ORDER — SODIUM CHLORIDE 0.9% FLUSH: 3.0000 mL | Freq: Once | INTRAVENOUS | Status: DC

## 2019-03-28 NOTE — ED Triage Notes (Signed)
Patient reports central chest pain/pressure onset this evening with SOB and emesis , denies fever or cough .

## 2019-03-28 NOTE — ED Notes (Signed)
Called for pt, 2 times, registration has also called for pt, I don't see him sitting in waiting room, another employee saw him leave through the front door.

## 2019-05-26 DIAGNOSIS — Z23 Encounter for immunization: Secondary | ICD-10-CM | POA: Diagnosis not present

## 2019-06-16 ENCOUNTER — Ambulatory Visit (HOSPITAL_COMMUNITY)
Admission: EM | Admit: 2019-06-16 | Discharge: 2019-06-16 | Disposition: A | Discharge status: Home / Self Care | Payer: Medicaid Other | Attending: Family Medicine | Admitting: Family Medicine

## 2019-06-16 ENCOUNTER — Ambulatory Visit (INDEPENDENT_AMBULATORY_CARE_PROVIDER_SITE_OTHER): Payer: Medicaid Other

## 2019-06-16 ENCOUNTER — Encounter (HOSPITAL_COMMUNITY): Payer: Self-pay

## 2019-06-16 ENCOUNTER — Other Ambulatory Visit: Payer: Self-pay

## 2019-06-16 DIAGNOSIS — S5001XA Contusion of right elbow, initial encounter: Secondary | ICD-10-CM | POA: Diagnosis not present

## 2019-06-16 DIAGNOSIS — S92414A Nondisplaced fracture of proximal phalanx of right great toe, initial encounter for closed fracture: Secondary | ICD-10-CM | POA: Diagnosis not present

## 2019-06-16 DIAGNOSIS — M25521 Pain in right elbow: Secondary | ICD-10-CM

## 2019-06-16 DIAGNOSIS — S59901A Unspecified injury of right elbow, initial encounter: Secondary | ICD-10-CM | POA: Diagnosis not present

## 2019-06-16 DIAGNOSIS — W228XXA Striking against or struck by other objects, initial encounter: Secondary | ICD-10-CM | POA: Diagnosis not present

## 2019-06-16 MED ORDER — HYDROCODONE-ACETAMINOPHEN 7.5-325 MG PO TABS: 1.0000 | ORAL_TABLET | Freq: Four times a day (QID) | ORAL | 0 refills | Status: DC | PRN

## 2019-06-16 NOTE — Discharge Instructions (Signed)
Wear fracture shoe for the toe fracture This will reduce pain and keep from additional injury Ice and elevate to reduce pain and swelling Take the pain medicine as needed, do not drive on pain pills Wear sling as long as you have elbow pain Follow up with orthopedic surgeon

## 2019-06-16 NOTE — ED Triage Notes (Signed)
Pt states a water heater fell off the back of the truck and hit his right foot and right elbow hit the trailer hard. Pt c/o 9/10 stabbing pain in right elbow and right foot. Pt limped to exam room. Pt has trace edema to right elbow. Pt has 1+ edema to right foot.

## 2019-06-16 NOTE — ED Provider Notes (Signed)
[facility]    CSN: [id number] Arrival date & time: 06/16/19  0807      History   Chief Complaint Chief Complaint  Patient presents with  . toe and elbow injury    HPI Connor Baker is a 59 y.o. male.   HPI   Patient reports injuries from a water heater falling off the back of the truck.  He had struck his right elbow, he has elbow pain and swelling and limited movement.  Pain with grasping.  And also landed on his right great toe.  He has pain with ambulation. He has been using ice and elevation.  Over-the-counter pain medications. Toes still very painful. He has a history of hypertension and heart disease.  He is compliant with his medical care and taking his medications. Past Medical History:  Diagnosis Date  . Arthritis    "legs" (08/28/2016)  . CHF (congestive heart failure) (HCC)   . Chronic lower back pain   . Coronary artery disease   . Depression   . GERD (gastroesophageal reflux disease)   . High cholesterol   . Hypertension   . MI (myocardial infarction) (HCC) 1992; 1994; 1995  . On home oxygen therapy    "4L just at night w/my CPAP" (08/28/2016)  . OSA (obstructive sleep apnea)    w/oxygen" (08/28/2016)  . Stroke [facility]) 2005   "mild one; faded my memory" (08/28/2016)    Patient Active Problem List   Diagnosis Date Noted  . Unstable angina (HCC) 12/31/2017  . Tobacco abuse 10/11/2017  . Hyperlipidemia 08/29/2016  . CAD (coronary artery disease), native coronary artery 08/28/2016  . Hypertension, essential     Past Surgical History:  Procedure Laterality Date  . ANKLE FRACTURE SURGERY Right 2016  . CORONARY ANGIOPLASTY    . CORONARY ANGIOPLASTY WITH STENT PLACEMENT  1992 - 2017   "I've got a total of 16 stents in me" (08/28/2016)  . CORONARY BALLOON ANGIOPLASTY Right 08/28/2016   Procedure: Coronary Balloon Angioplasty;  Surgeon: Lyn Records, MD;  Location: [facility] INVASIVE CV LAB;  Service: Cardiovascular;  Laterality: Right;  . FRACTURE SURGERY      . LEFT HEART CATH AND CORONARY ANGIOGRAPHY N/A 08/28/2016   Procedure: Left Heart Cath and Coronary Angiography;  Surgeon: Lyn Records, MD;  Location: [facility] INVASIVE CV LAB;  Service: Cardiovascular;  Laterality: N/A;  . LEFT HEART CATH AND CORONARY ANGIOGRAPHY N/A 10/24/2016   Procedure: LEFT HEART CATH AND CORONARY ANGIOGRAPHY;  Surgeon: Swazil[place], Peter M, MD;  Location: [facility] INVASIVE CV LAB;  Service: Cardiovascular;  Laterality: N/A;  . TOTAL HIP ARTHROPLASTY Right 1990       Home Medications    Prior to Admission medications   Medication Sig Start Date End Date Taking? Authorizing Provider  acetaminophen (TYLENOL) 325 MG tablet Take 650 mg by mouth every 6 (six) hours as needed for mild pain.    [provider]  albuterol (VENTOLIN HFA) 108 (90 Base) MCG/ACT inhaler Inhale 1-2 puffs into the lungs every 6 (six) hours as needed for wheezing or shortness of breath. 12/27/18   Domenick Gong, MD  aspirin EC 81 MG EC tablet Take 1 tablet (81 mg total) by mouth daily. 01/03/18   Filbert Schilder, NP  atorvastatin (LIPITOR) 80 MG tablet Take 1 tablet (80 mg total) by mouth daily at 6 PM. 01/02/18   Georgie Chard D, NP  clopidogrel (PLAVIX) 75 MG tablet Take 1 tablet (75 mg total) by mouth daily. 01/03/18  Tommie Raymond, NP  fluticasone [facility]) 50 MCG/ACT nasal spray Place 2 sprays into both nostrils daily. 12/27/18   Melynda Ripple, MD  HYDROcodone-acetaminophen (NORCO) 7.5-325 MG tablet Take 1 tablet by mouth every 6 (six) hours as needed for moderate pain. 06/16/19   Raylene Everts, MD  isosorbide mononitrate (IMDUR) 30 MG 24 hr tablet Take 0.5 tablets (15 mg total) by mouth daily. 01/03/18   Kathyrn Drown D, NP  metoprolol tartrate (LOPRESSOR) 25 MG tablet Take 0.5 tablets (12.5 mg total) by mouth 2 (two) times daily. 01/02/18   Tommie Raymond, NP  nitroGLYCERIN (NITROSTAT) 0.4 MG SL tablet Place 1 tablet (0.4 mg total) under the tongue every 5 (five) minutes x 3 doses as  needed for chest pain. 01/02/18   Tommie Raymond, NP  Spacer/Aero-Holding Chambers (AEROCHAMBER PLUS) inhaler Use as instructed 12/27/18   Melynda Ripple, MD    Family History Family History  Problem Relation Age of Onset  . Heart attack Father   . Diabetes Mother     Social History Social History   Tobacco Use  . Smoking status: Current Every Day Smoker    Packs/day: 1.00    Years: 1.00    Pack years: 1.00    Types: Cigarettes  . Smokeless tobacco: Never Used  . Tobacco comment: "quit smoking in the 1990s"/ started back smoking at first of 2019  Substance Use Topics  . Alcohol use: Not Currently    Comment: 08/28/2016 "stopped 10-15 yr ago"  . Drug use: Not Currently     Allergies   Patient has no known allergies.   Review of Systems Review of Systems  Musculoskeletal: Positive for arthralgias and gait problem.     Physical Exam Triage Vital Signs ED Triage Vitals  Enc Vitals Group     BP 06/16/19 0826 (!) 157/96     Pulse Rate 06/16/19 0826 66     Resp 06/16/19 0826 18     Temp 06/16/19 0826 97.6 F (36.4 C)     Temp Source 06/16/19 0826 Oral     SpO2 06/16/19 0826 100 %     Weight 06/16/19 0827 154 lb (69.9 kg)     Height 06/16/19 0827 5\' 10"  (1.778 m)     Head Circumference --      Peak Flow --      Pain Score 06/16/19 0826 9     Pain Loc --      Pain Edu? --      Excl. in [place]? --    No data found.  Updated Vital Signs BP (!) 157/96   Pulse 66   Temp 97.6 F (36.4 C) (Oral)   Resp 18   Ht 5\' 10"  (1.778 m)   Wt 69.9 kg   SpO2 100%   BMI 22.10 kg/m     Physical Exam Constitutional:      General: He is not in acute distress.    Appearance: He is well-developed and normal weight.     Comments: Very polite.  Cooperative  HENT:     Head: Normocephalic and atraumatic.     Mouth/Throat:     Comments: Mask is in place Eyes:     Conjunctiva/sclera: Conjunctivae normal.     Pupils: Pupils are equal, round, and reactive to light.    Cardiovascular:     Rate and Rhythm: Normal rate.  Pulmonary:     Effort: Pulmonary effort is normal. No respiratory distress.  Musculoskeletal:  General: Normal range of motion.     Cervical back: Normal range of motion.     Comments: Right elbow has tenderness over the lateral malleolus.  He can flex to 90 degrees and extend to within 10 degrees of normal.  He expresses pain with wrist movement, pronation and supination activities.  Mild soft tissue swelling over lateral malleolus.  Right foot has swelling and tenderness over the great toe and second toe.  Tenderness with any movement of the great toe, palpation of the MTP and PIP joint  Skin:    General: Skin is warm and dry.  Neurological:     Mental Status: He is alert.  Psychiatric:        Mood and Affect: Mood normal.        Behavior: Behavior normal.      UC Treatments / Results  Labs (all labs ordered are listed, but only abnormal results are displayed) Labs Reviewed - No data to display  EKG   Radiology DG Elbow Complete Right  Result Date: 06/16/2019 CLINICAL DATA:  Injured right elbow. EXAM: RIGHT ELBOW - COMPLETE 3+ VIEW COMPARISON:  None. FINDINGS: The joint spaces are maintained. No acute fractures identified. No joint effusion. IMPRESSION: No acute bony findings. Electronically Signed   By: Rudie Meyer M.D.   On: 06/16/2019 09:37   DG Foot Complete Right  Result Date: 06/16/2019 CLINICAL DATA:  Water heater fell on foot. EXAM: RIGHT FOOT COMPLETE - 3+ VIEW COMPARISON:  None. FINDINGS: Evidence of remote ankle trauma with fixating hardware. Moderate degenerative changes at the first MTP joint with mild hallux valgus deformity. The other joint spaces are maintained. There is a nondisplaced longitudinal fracture involving the proximal phalanx of the great toe. It may extend to the articular surface distally. No other acute bony findings. Calcifications along the plantar aspect of the medial midfoot may be due  to remote trauma. IMPRESSION: Nondisplaced longitudinal fracture involving the proximal phalanx of the great toe. Electronically Signed   By: Rudie Meyer M.D.   On: 06/16/2019 09:41    Procedures Procedures (including critical care time)  Medications Ordered in UC Medications - No data to display  Initial Impression / Assessment and Plan / UC Course  I have reviewed the triage vital signs and the nursing notes.  Pertinent labs & imaging results that were available during my care of the patient were reviewed by me and considered in my medical decision making (see chart for details).     Foot fracture discussed with patient.  Activity limits reviewed until cleared by orthopedic Final Clinical Impressions(s) / UC Diagnoses   Final diagnoses:  Right elbow pain  Contusion of right elbow, initial encounter  Closed nondisplaced fracture of proximal phalanx of right great toe, initial encounter     Discharge Instructions     Wear fracture shoe for the toe fracture This will reduce pain and keep from additional injury Ice and elevate to reduce pain and swelling Take the pain medicine as needed, do not drive on pain pills Wear sling as long as you have elbow pain Follow up with orthopedic surgeon    ED Prescriptions    Medication Sig Dispense Auth. Provider   HYDROcodone-acetaminophen (NORCO) 7.5-325 MG tablet Take 1 tablet by mouth every 6 (six) hours as needed for moderate pain. 15 tablet Eustace Moore, MD     I have reviewed the PDMP during this encounter.   Eustace Moore, MD 06/16/19 2002

## 2019-09-19 ENCOUNTER — Ambulatory Visit (HOSPITAL_COMMUNITY)
Admission: EM | Admit: 2019-09-19 | Discharge: 2019-09-19 | Disposition: A | Discharge status: Home / Self Care | Payer: Medicaid Other

## 2019-09-19 ENCOUNTER — Other Ambulatory Visit: Payer: Self-pay

## 2019-09-19 NOTE — ED Notes (Addendum)
Pt called for triage and no answer 

## 2019-10-12 ENCOUNTER — Other Ambulatory Visit: Payer: Self-pay

## 2019-10-12 ENCOUNTER — Emergency Department (HOSPITAL_COMMUNITY): Payer: Medicaid Other

## 2019-10-12 ENCOUNTER — Encounter (HOSPITAL_COMMUNITY): Payer: Self-pay | Admitting: *Deleted

## 2019-10-12 ENCOUNTER — Emergency Department (HOSPITAL_COMMUNITY)
Admission: EM | Admit: 2019-10-12 | Discharge: 2019-10-12 | Disposition: A | Discharge status: Home / Self Care | Payer: Medicaid Other | Attending: Emergency Medicine | Admitting: Emergency Medicine

## 2019-10-12 DIAGNOSIS — R0602 Shortness of breath: Secondary | ICD-10-CM | POA: Diagnosis not present

## 2019-10-12 DIAGNOSIS — R61 Generalized hyperhidrosis: Secondary | ICD-10-CM | POA: Insufficient documentation

## 2019-10-12 DIAGNOSIS — Z5321 Procedure and treatment not carried out due to patient leaving prior to being seen by health care provider: Secondary | ICD-10-CM | POA: Diagnosis not present

## 2019-10-12 DIAGNOSIS — R079 Chest pain, unspecified: Secondary | ICD-10-CM | POA: Diagnosis not present

## 2019-10-12 DIAGNOSIS — R110 Nausea: Secondary | ICD-10-CM | POA: Diagnosis not present

## 2019-10-12 LAB — CBC
HCT: 48.5 % (ref 39.0–52.0)
Hemoglobin: 17.2 g/dL — ABNORMAL HIGH (ref 13.0–17.0)
MCH: 34.6 pg — ABNORMAL HIGH (ref 26.0–34.0)
MCHC: 35.5 g/dL (ref 30.0–36.0)
MCV: 97.6 fL (ref 80.0–100.0)
Platelets: 204 10*3/uL (ref 150–400)
RBC: 4.97 MIL/uL (ref 4.22–5.81)
RDW: 12.8 % (ref 11.5–15.5)
WBC: 9.4 10*3/uL (ref 4.0–10.5)
nRBC: 0 % (ref 0.0–0.2)

## 2019-10-12 LAB — BASIC METABOLIC PANEL
Anion gap: 12 (ref 5–15)
BUN: 7 mg/dL — ABNORMAL LOW (ref 8–23)
CO2: 22 mmol/L (ref 22–32)
Calcium: 9.6 mg/dL (ref 8.9–10.3)
Chloride: 103 mmol/L (ref 98–111)
Creatinine, Ser: 0.98 mg/dL (ref 0.61–1.24)
GFR calc Af Amer: >60 mL/min (ref >60)
GFR calc non Af Amer: >60 mL/min (ref >60)
Glucose, Bld: 82 mg/dL (ref 70–99)
Potassium: 3.6 mmol/L (ref 3.5–5.1)
Sodium: 137 mmol/L (ref 135–145)

## 2019-10-12 LAB — TROPONIN I (HIGH SENSITIVITY): Troponin I (High Sensitivity): 4 ng/L (ref <18)

## 2019-10-12 NOTE — ED Notes (Signed)
Pt expressed complaints of chest pain and increased shortness of breath. Vital signs updated and reported to triage RN.

## 2019-10-12 NOTE — ED Triage Notes (Signed)
Pt is here for chest pain x1 hour that feels like "an elephant sitting on my chest".  Pt reports diaphoresis and sob and nausea with this.  Pt states that he has been out of his medications due to lack of money.

## 2019-10-13 ENCOUNTER — Telehealth: Payer: Self-pay | Admitting: *Deleted

## 2019-10-13 NOTE — Telephone Encounter (Signed)
Mattias D Burdett presented to the ED and left before being seen by the provider on 10/12/19. The patient has been enrolled in an automated general discharge outreach program and 2 attempts to contact the patient will be made to follow up on their ED visit and subsequent needs. The care management team is available to provide assistance to this patient at any time.   Burnard Bunting, RN, BSN, CCRN [facility] [phone]

## 2019-10-17 ENCOUNTER — Telehealth: Payer: Self-pay | Admitting: *Deleted

## 2019-10-17 NOTE — Telephone Encounter (Signed)
Email sent to  Internal Medicine to  request ed follow up appointment .                                Syriah Delisi                                  PEC                              [phone]

## 2019-11-06 IMAGING — DX DG CHEST 2V
2 series · 2 of 2 positions shown · non-contrast
Comparison: PA and lateral chest 09/08/2017 and 03/08/2018.

CLINICAL DATA: Cough since 12/24/2018.

EXAM:
CHEST - 2 VIEW

[chest pa]
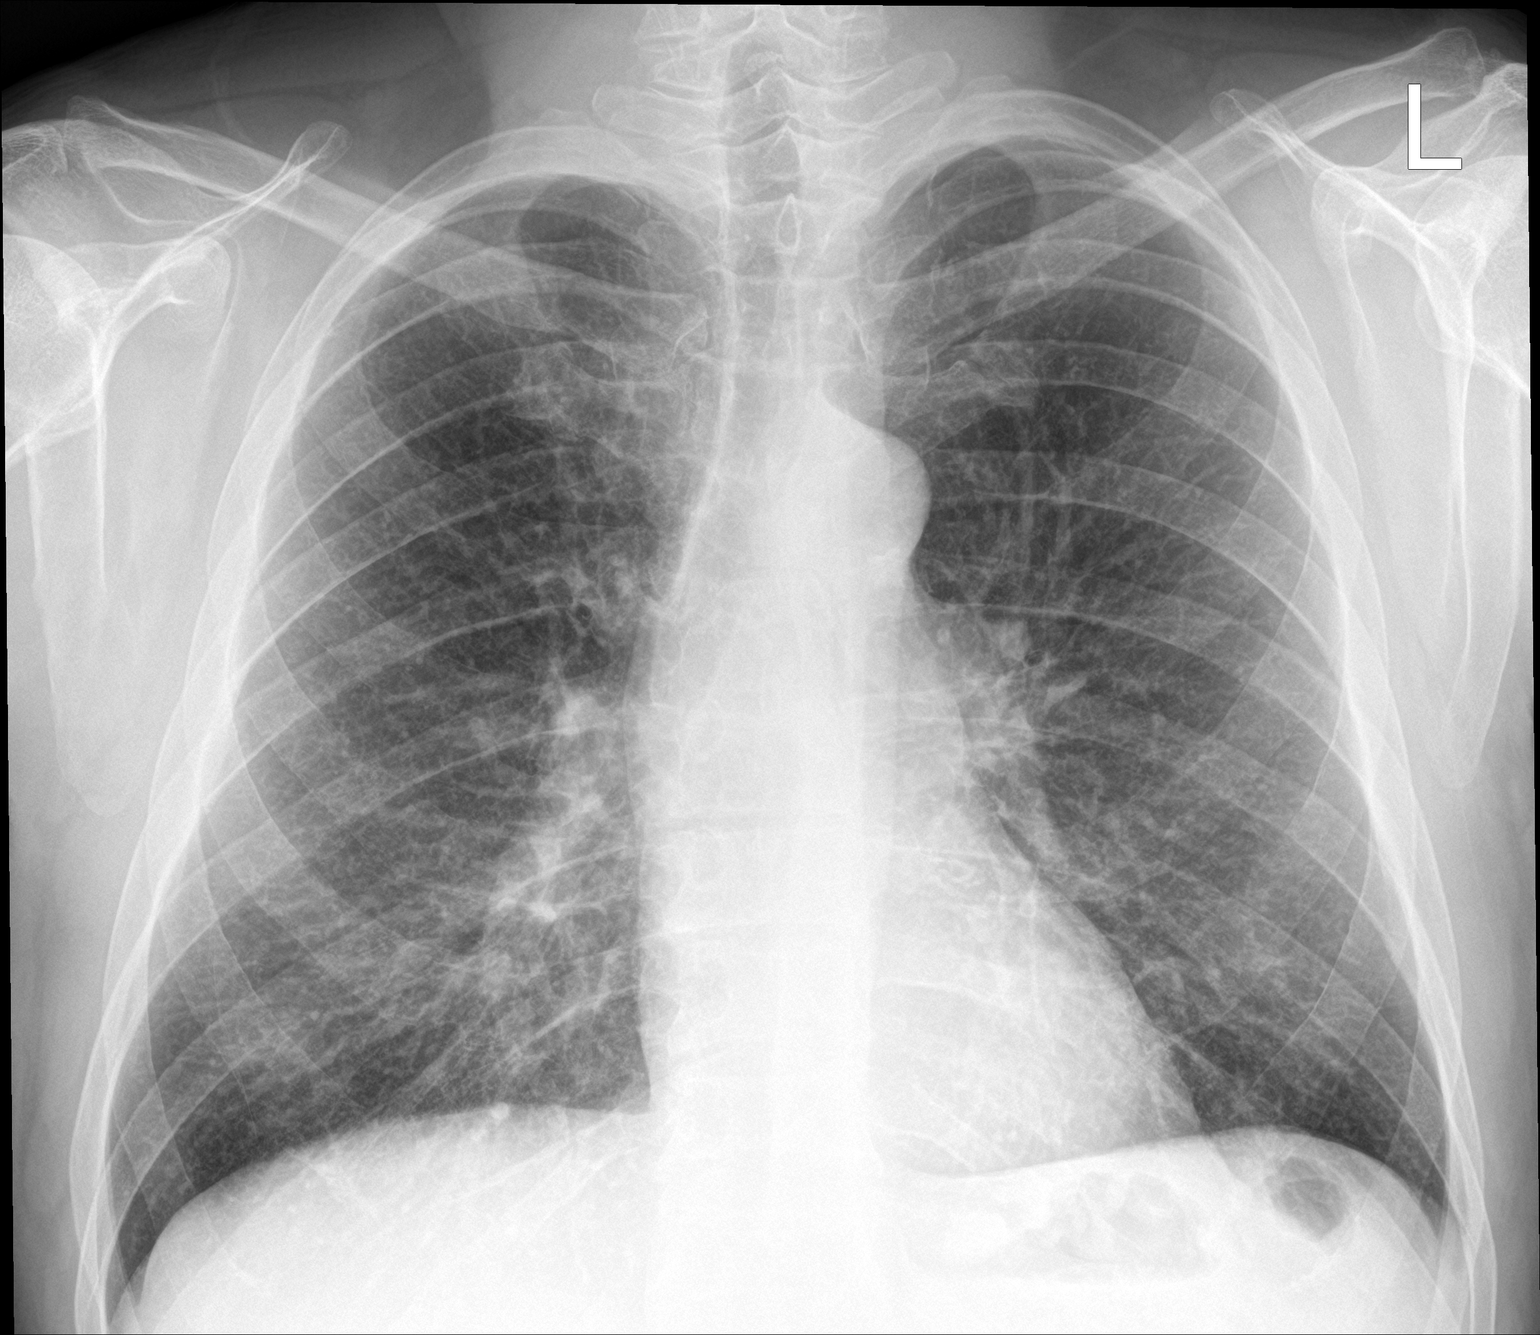

[chest lat]
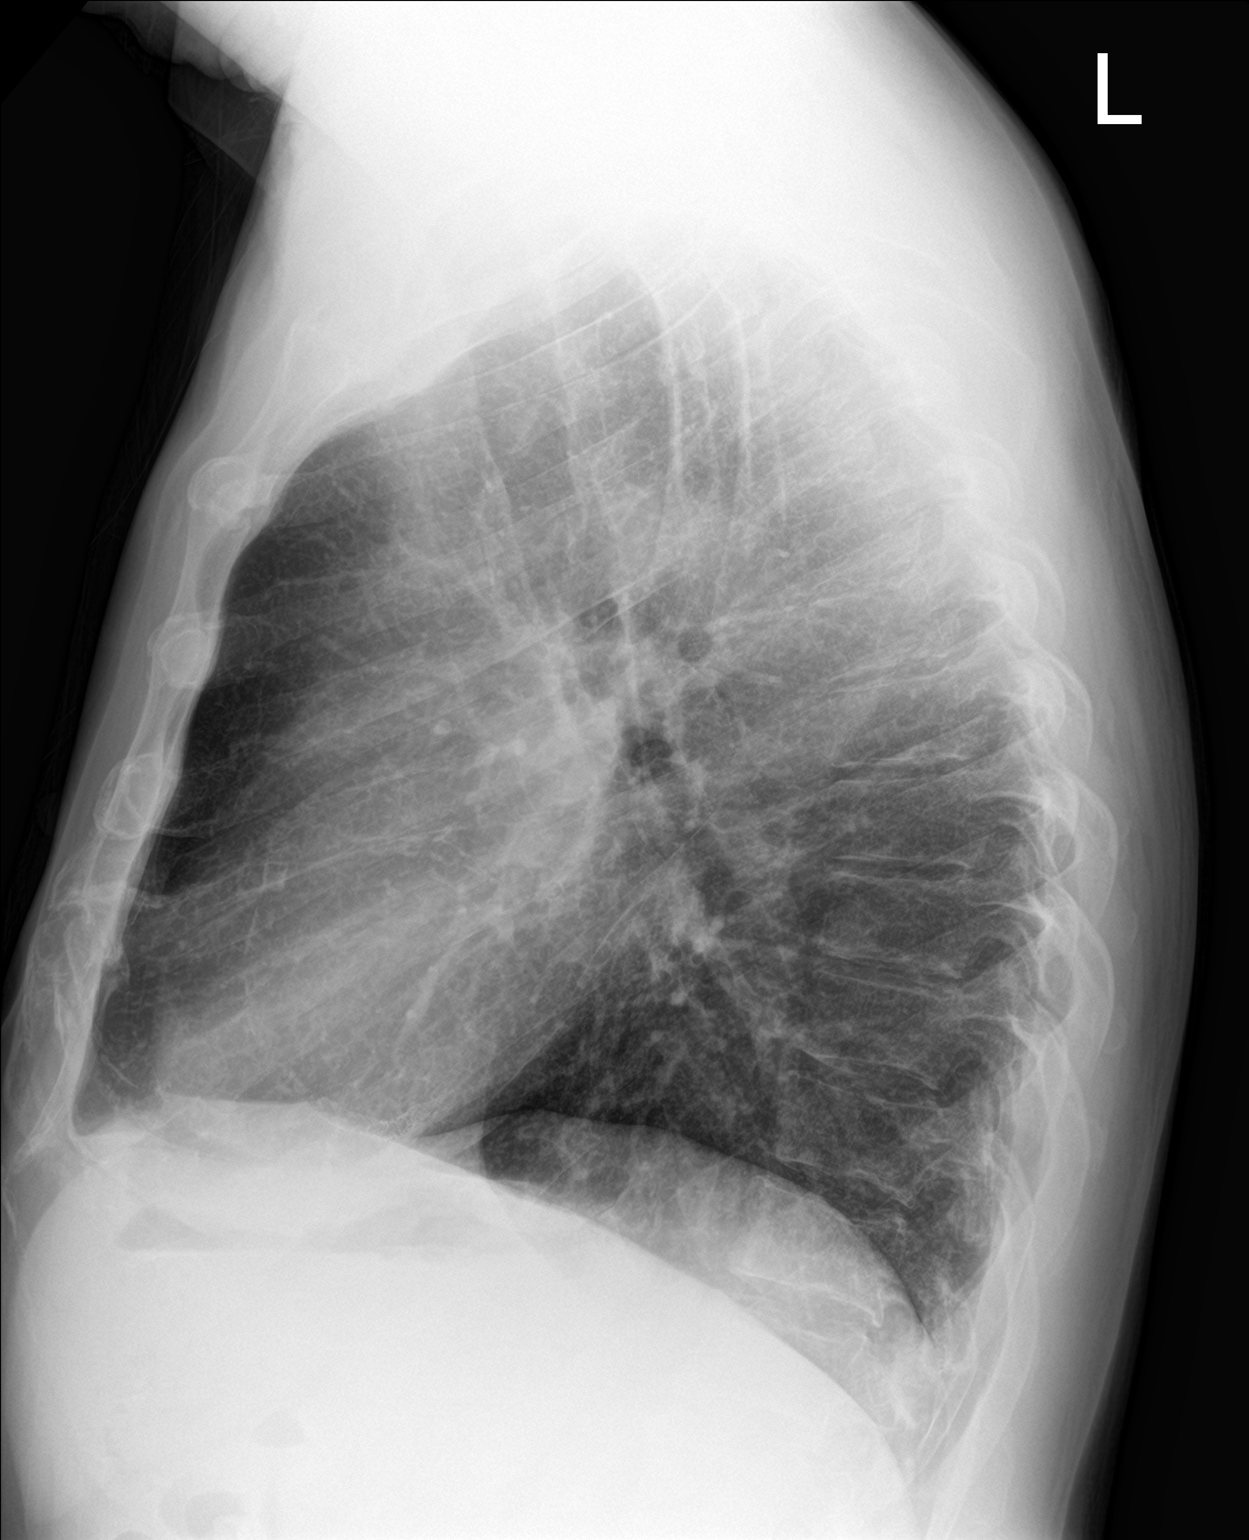

[2 of 2 positions shown; findings below may reference images not displayed]

FINDINGS: The chest is hyperexpanded with mild-to-moderate peribronchial
thickening, unchanged. No consolidative process, pneumothorax or
effusion. Heart size is normal. Aortic atherosclerosis noted. No
acute or focal bony abnormality.
IMPRESSION: No acute disease.

Appearance of the chest compatible with COPD.

Atherosclerosis.

## 2020-02-07 ENCOUNTER — Emergency Department (HOSPITAL_COMMUNITY): Payer: Medicaid Other

## 2020-02-07 ENCOUNTER — Other Ambulatory Visit: Payer: Self-pay

## 2020-02-07 ENCOUNTER — Encounter (HOSPITAL_COMMUNITY): Payer: Self-pay | Admitting: Emergency Medicine

## 2020-02-07 ENCOUNTER — Emergency Department (HOSPITAL_COMMUNITY)
Admission: EM | Admit: 2020-02-07 | Discharge: 2020-02-07 | Disposition: A | Discharge status: Home / Self Care | Payer: Medicaid Other | Attending: Emergency Medicine | Admitting: Emergency Medicine

## 2020-02-07 DIAGNOSIS — Z5321 Procedure and treatment not carried out due to patient leaving prior to being seen by health care provider: Secondary | ICD-10-CM | POA: Insufficient documentation

## 2020-02-07 DIAGNOSIS — R079 Chest pain, unspecified: Secondary | ICD-10-CM | POA: Insufficient documentation

## 2020-02-07 DIAGNOSIS — R110 Nausea: Secondary | ICD-10-CM | POA: Diagnosis not present

## 2020-02-07 DIAGNOSIS — R0602 Shortness of breath: Secondary | ICD-10-CM | POA: Insufficient documentation

## 2020-02-07 DIAGNOSIS — R0789 Other chest pain: Secondary | ICD-10-CM | POA: Diagnosis not present

## 2020-02-07 LAB — TROPONIN I (HIGH SENSITIVITY): Troponin I (High Sensitivity): 6 ng/L (ref <18)

## 2020-02-07 LAB — BASIC METABOLIC PANEL
Anion gap: 10 (ref 5–15)
BUN: 6 mg/dL — ABNORMAL LOW (ref 8–23)
CO2: 26 mmol/L (ref 22–32)
Calcium: 9.3 mg/dL (ref 8.9–10.3)
Chloride: 105 mmol/L (ref 98–111)
Creatinine, Ser: 1.06 mg/dL (ref 0.61–1.24)
GFR, Estimated: >60 mL/min (ref >60)
Glucose, Bld: 98 mg/dL (ref 70–99)
Potassium: 3.5 mmol/L (ref 3.5–5.1)
Sodium: 141 mmol/L (ref 135–145)

## 2020-02-07 LAB — CBC
HCT: 46.2 % (ref 39.0–52.0)
Hemoglobin: 15.6 g/dL (ref 13.0–17.0)
MCH: 33.6 pg (ref 26.0–34.0)
MCHC: 33.8 g/dL (ref 30.0–36.0)
MCV: 99.6 fL (ref 80.0–100.0)
Platelets: 197 10*3/uL (ref 150–400)
RBC: 4.64 MIL/uL (ref 4.22–5.81)
RDW: 13 % (ref 11.5–15.5)
WBC: 9 10*3/uL (ref 4.0–10.5)
nRBC: 0 % (ref 0.0–0.2)

## 2020-02-07 NOTE — ED Notes (Signed)
Name called, no answer x3

## 2020-02-07 NOTE — ED Triage Notes (Signed)
Patient with chest pain for last 30 mins, states that it feels like a pressure sensation.  Patient does have shortness of breath, nausea with the pain.  NSR on monitor.  Patient has been given one SL nitro en route to ED.  BP was 160/p and after nitro was 130/76.  Patient was initially 10/10 and now having 8/10 pain.  324mg  ASA given.

## 2020-05-14 ENCOUNTER — Other Ambulatory Visit: Payer: Self-pay

## 2020-05-14 ENCOUNTER — Observation Stay (HOSPITAL_COMMUNITY)
Admission: EM | Admit: 2020-05-14 | Discharge: 2020-05-15 | Disposition: A | Discharge status: Home / Self Care | Payer: Medicaid Other | Attending: Cardiovascular Disease | Admitting: Cardiovascular Disease

## 2020-05-14 ENCOUNTER — Emergency Department (HOSPITAL_COMMUNITY): Payer: Medicaid Other

## 2020-05-14 DIAGNOSIS — E785 Hyperlipidemia, unspecified: Secondary | ICD-10-CM | POA: Diagnosis present

## 2020-05-14 DIAGNOSIS — I2510 Atherosclerotic heart disease of native coronary artery without angina pectoris: Secondary | ICD-10-CM | POA: Diagnosis present

## 2020-05-14 DIAGNOSIS — Z8673 Personal history of transient ischemic attack (TIA), and cerebral infarction without residual deficits: Secondary | ICD-10-CM | POA: Insufficient documentation

## 2020-05-14 DIAGNOSIS — R072 Precordial pain: Principal | ICD-10-CM | POA: Insufficient documentation

## 2020-05-14 DIAGNOSIS — Z9861 Coronary angioplasty status: Secondary | ICD-10-CM | POA: Insufficient documentation

## 2020-05-14 DIAGNOSIS — R112 Nausea with vomiting, unspecified: Secondary | ICD-10-CM | POA: Diagnosis not present

## 2020-05-14 DIAGNOSIS — I110 Hypertensive heart disease with heart failure: Secondary | ICD-10-CM | POA: Diagnosis not present

## 2020-05-14 DIAGNOSIS — I509 Heart failure, unspecified: Secondary | ICD-10-CM | POA: Diagnosis not present

## 2020-05-14 DIAGNOSIS — F17210 Nicotine dependence, cigarettes, uncomplicated: Secondary | ICD-10-CM | POA: Insufficient documentation

## 2020-05-14 DIAGNOSIS — I25110 Atherosclerotic heart disease of native coronary artery with unstable angina pectoris: Secondary | ICD-10-CM | POA: Insufficient documentation

## 2020-05-14 DIAGNOSIS — Z20822 Contact with and (suspected) exposure to covid-19: Secondary | ICD-10-CM | POA: Insufficient documentation

## 2020-05-14 DIAGNOSIS — I10 Essential (primary) hypertension: Secondary | ICD-10-CM | POA: Diagnosis present

## 2020-05-14 DIAGNOSIS — R079 Chest pain, unspecified: Secondary | ICD-10-CM | POA: Diagnosis not present

## 2020-05-14 DIAGNOSIS — I213 ST elevation (STEMI) myocardial infarction of unspecified site: Secondary | ICD-10-CM | POA: Diagnosis not present

## 2020-05-14 DIAGNOSIS — R0789 Other chest pain: Secondary | ICD-10-CM | POA: Diagnosis not present

## 2020-05-14 DIAGNOSIS — R0602 Shortness of breath: Secondary | ICD-10-CM | POA: Diagnosis not present

## 2020-05-14 DIAGNOSIS — R531 Weakness: Secondary | ICD-10-CM | POA: Diagnosis not present

## 2020-05-14 DIAGNOSIS — Z96641 Presence of right artificial hip joint: Secondary | ICD-10-CM | POA: Diagnosis not present

## 2020-05-14 DIAGNOSIS — J811 Chronic pulmonary edema: Secondary | ICD-10-CM | POA: Diagnosis not present

## 2020-05-14 DIAGNOSIS — Z720 Tobacco use: Secondary | ICD-10-CM | POA: Diagnosis present

## 2020-05-14 LAB — COMPREHENSIVE METABOLIC PANEL
ALT: 14 U/L (ref 0–44)
AST: 22 U/L (ref 15–41)
Albumin: 3.6 g/dL (ref 3.5–5.0)
Alkaline Phosphatase: 64 U/L (ref 38–126)
Anion gap: 9 (ref 5–15)
BUN: 5 mg/dL — ABNORMAL LOW (ref 8–23)
CO2: 22 mmol/L (ref 22–32)
Calcium: 8.5 mg/dL — ABNORMAL LOW (ref 8.9–10.3)
Chloride: 105 mmol/L (ref 98–111)
Creatinine, Ser: 0.86 mg/dL (ref 0.61–1.24)
GFR, Estimated: >60 mL/min (ref >60)
Glucose, Bld: 87 mg/dL (ref 70–99)
Potassium: 3.5 mmol/L (ref 3.5–5.1)
Sodium: 136 mmol/L (ref 135–145)
Total Bilirubin: 0.7 mg/dL (ref 0.3–1.2)
Total Protein: 6.1 g/dL — ABNORMAL LOW (ref 6.5–8.1)

## 2020-05-14 LAB — CBC
HCT: 45.2 % (ref 39.0–52.0)
Hemoglobin: 15.7 g/dL (ref 13.0–17.0)
MCH: 34.7 pg — ABNORMAL HIGH (ref 26.0–34.0)
MCHC: 34.7 g/dL (ref 30.0–36.0)
MCV: 100 fL (ref 80.0–100.0)
Platelets: 186 10*3/uL (ref 150–400)
RBC: 4.52 MIL/uL (ref 4.22–5.81)
RDW: 12.7 % (ref 11.5–15.5)
WBC: 8.1 10*3/uL (ref 4.0–10.5)
nRBC: 0 % (ref 0.0–0.2)

## 2020-05-14 LAB — LIPASE, BLOOD: Lipase: 33 U/L (ref 11–51)

## 2020-05-14 LAB — CBG MONITORING, ED: Glucose-Capillary: 87 mg/dL (ref 70–99)

## 2020-05-14 LAB — TROPONIN I (HIGH SENSITIVITY): Troponin I (High Sensitivity): 6 ng/L (ref <18)

## 2020-05-14 MED ORDER — FENTANYL CITRATE (PF) 100 MCG/2ML IJ SOLN
75.0000 ug | Freq: Once | INTRAMUSCULAR | Status: AC
  Administered 2020-05-14: 75 ug via INTRAVENOUS
  Filled 2020-05-14: qty 2

## 2020-05-14 MED ORDER — ONDANSETRON HCL 4 MG/2ML IJ SOLN
4.0000 mg | Freq: Once | INTRAMUSCULAR | Status: AC
  Administered 2020-05-14: 4 mg via INTRAVENOUS
  Filled 2020-05-14: qty 2

## 2020-05-14 NOTE — ED Triage Notes (Signed)
Pt BIB by [facility] for intermittent CP, nausea and SOB since around 1P this afternoon.  Pain became worse with vomiting around 30 min before transport.  Felt like previous MI.  Pt has hx of 3 MI's and 16 stents according to EMS

## 2020-05-14 NOTE — ED Provider Notes (Signed)
Medical screening examination/treatment/procedure(s) were conducted as a shared visit with non-physician practitioner(s) and myself.  I personally evaluated the patient during the encounter.  Clinical Impression:   Final diagnoses:  Chest pain, unspecified type    This patient is a 61 year old male known coronary disease, he has had interventions in the past with heart caths, he also has a history of noncompliance with his medication.  He presents today with a complaint of chest pain, this has been somewhat intermittent throughout the day, sternal, pressure, took nitro, unsure if the nitro was out of date or not.  There was some shortness of breath with it as well.  On my exam the patient has a heart rate in the 60s, blood pressure is low at 101/75, afebrile, sats of 95%, occasional wheezing on lung exam but able speak in full sentences and does not appear in distress.  EKG is unchanged, this patient is high risk for recurrent coronary disease and will likely need to be admitted to the hospital.   EKG Interpretation  Date/Time:  Tuesday May 15 2020 04:19:52 EDT Ventricular Rate:  58 PR Interval:    QRS Duration: 95 QT Interval:  431 QTC Calculation: 424 R Axis:   75 Text Interpretation: Sinus rhythm Abnormal R-wave progression, early transition similar to yesterday Confirmed by Pricilla Loveless [phone]) on 05/16/2020 9:40:39 AM          Eber Hong, MD 05/17/20 1501

## 2020-05-14 NOTE — ED Provider Notes (Signed)
[facility] EMERGENCY DEPARTMENT Provider Note   CSN: [id number] Arrival date & time: 05/14/20  2156     History Chief Complaint  Patient presents with  . Chest Pain    Connor Baker is a 60 y.o. male with a history of CAD with multiple prior percutaneous coronary interventions to the right coronary artery (most recent LHC 09/2016 with CTO of RPL branch and L->R collaterals), hypertension, hyperlipidemia, obstructive sleep apnea) who presents the emergency department by EMS with a chief complaint of chest pain.  The patient endorses intermittent pressure-like, substernal chest pain that began around 13:00-14:00 this afternoon accompanied by diaphoresis and nausea.  Approximately 30 minutes prior to arrival, the pain significantly worsened to 10 out of 10, became constant, and he became acutely short of breath accompanied by worsening diaphoresis, nausea, bilateral upper back pain, and multiple episodes of nonbloody, nonbilious vomiting.  No known aggravating or alleviating factors for chest pain.  He is also endorsing numbness and paresthesias radiating down the left hand.  He states that symptoms feel similar to previous MIs.  He did report some improvement with nitroglycerin prior to arrival, but is unsure of how old the medication was.  States that it was a prescription from a previous ER visit but he is not sure how many years ago that he was last seen.  He does not follow with cardiology in the clinic.  He states that he is unable to afford medications and does not take any daily medications.  He denies abdominal pain, dizziness, lightheadedness, headache, syncope, neck pain or stiffness, jaw pain, leg swelling, orthopnea, flank pain, visual changes, weakness.  He is a current, every day smoker.  He denies alcohol.  No illicit or recreational substance use.  He was given 324 mg of ASA in route.  Blood pressure was 160 systolic, improving to 130/76 after he was given 1  sublingual nitro.  The history is provided by the patient and medical records. No language interpreter was used.       Past Medical History:  Diagnosis Date  . Arthritis    "legs" (08/28/2016)  . CHF (congestive heart failure) (HCC)   . Chronic lower back pain   . Coronary artery disease   . Depression   . GERD (gastroesophageal reflux disease)   . High cholesterol   . Hypertension   . MI (myocardial infarction) (HCC) 1992; 1994; 1995  . On home oxygen therapy    "4L just at night w/my CPAP" (08/28/2016)  . OSA (obstructive sleep apnea)    w/oxygen" (08/28/2016)  . Stroke [facility]) 2005   "mild one; faded my memory" (08/28/2016)    Patient Active Problem List   Diagnosis Date Noted  . Unstable angina (HCC) 12/31/2017  . Tobacco abuse 10/11/2017  . Hyperlipidemia 08/29/2016  . CAD (coronary artery disease), native coronary artery 08/28/2016  . Hypertension, essential     Past Surgical History:  Procedure Laterality Date  . ANKLE FRACTURE SURGERY Right 2016  . CORONARY ANGIOPLASTY    . CORONARY ANGIOPLASTY WITH STENT PLACEMENT  1992 - 2017   "I've got a total of 16 stents in me" (08/28/2016)  . CORONARY BALLOON ANGIOPLASTY Right 08/28/2016   Procedure: Coronary Balloon Angioplasty;  Surgeon: Lyn Records, MD;  Location: [facility] INVASIVE CV LAB;  Service: Cardiovascular;  Laterality: Right;  . FRACTURE SURGERY    . LEFT HEART CATH AND CORONARY ANGIOGRAPHY N/A 08/28/2016   Procedure: Left Heart Cath and Coronary Angiography;  Surgeon:  Lyn RecordsSmith, Henry W, MD;  [facility];  Service: Cardiovascular;  Laterality: N/A;  . LEFT HEART CATH AND CORONARY ANGIOGRAPHY N/A 10/24/2016   Procedure: LEFT HEART CATH AND CORONARY ANGIOGRAPHY;  Surgeon: [place], Peter M, MD;  [facility];  Service: Cardiovascular;  Laterality: N/A;  . TOTAL HIP ARTHROPLASTY Right 1990       Family History  Problem Relation Age of Onset  . Heart attack Father   . Diabetes Mother      Social History   Tobacco Use  . Smoking status: Current Every Day Smoker    Packs/day: 1.00    Years: 1.00    Pack years: 1.00    Types: Cigarettes  . Smokeless tobacco: Never Used  . Tobacco comment: "quit smoking in the 1990s"/ started back smoking at first of 2019  Vaping Use  . Vaping Use: Never used  Substance Use Topics  . Alcohol use: Not Currently    Comment: 08/28/2016 "stopped 10-15 yr ago"  . Drug use: Not Currently    Home Medications Prior to Admission medications   Medication Sig Start Date End Date Taking? Authorizing Provider  nitroGLYCERIN (NITROSTAT) 0.4 MG SL tablet Place 1 tablet (0.4 mg total) under the tongue every 5 (five) minutes x 3 doses as needed for chest pain. 01/02/18  Yes Filbert SchilderMcDaniel, Jill D, NP    Allergies    Patient has no known allergies.  Review of Systems   Review of Systems  Constitutional: Positive for diaphoresis. Negative for appetite change and fever.  HENT: Negative for congestion and sore throat.   Eyes: Negative for visual disturbance.  Respiratory: Positive for shortness of breath. Negative for cough, choking and wheezing.   Cardiovascular: Positive for chest pain. Negative for palpitations and leg swelling.  Gastrointestinal: Positive for nausea and vomiting. Negative for abdominal pain, blood in stool, constipation and diarrhea.  Genitourinary: Negative for dysuria, flank pain and urgency.  Musculoskeletal: Negative for back pain, myalgias, neck pain and neck stiffness.  Skin: Negative for rash and wound.  Allergic/Immunologic: Negative for immunocompromised state.  Neurological: Positive for numbness. Negative for syncope, weakness and headaches.       Paresthesias  Psychiatric/Behavioral: Negative for confusion.    Physical Exam Updated Vital Signs BP 100/76   Pulse (!) 58   Temp 97.7 F (36.5 C) (Oral)   Resp 17   Ht 5\' 10"  (1.778 m)   Wt 70.3 kg   SpO2 96%   BMI 22.24 kg/m   Physical Exam Vitals and nursing  note reviewed.  Constitutional:      General: He is not in acute distress.    Appearance: He is well-developed. He is not ill-appearing or diaphoretic.     Comments: Thin male.  Appears older than stated age.  HENT:     Head: Normocephalic.  Eyes:     Conjunctiva/sclera: Conjunctivae normal.  Neck:     Comments: No JVD Cardiovascular:     Rate and Rhythm: Normal rate and regular rhythm.     Pulses: Normal pulses.     Heart sounds: Normal heart sounds. No murmur heard. No friction rub. No gallop.      Comments: Referral pulses are 2+ and symmetric bilaterally. Pulmonary:     Effort: Pulmonary effort is normal.     Comments: Crackles in the left lung base.  Lungs are otherwise clear to auscultation bilaterally.  No increased work of breathing.  Patient is able to speak in complete, fluent  sentences without increased work of breathing. Abdominal:     General: There is no distension.     Palpations: Abdomen is soft. There is no mass.     Tenderness: There is no abdominal tenderness. There is no right CVA tenderness, left CVA tenderness, guarding or rebound.     Hernia: No hernia is present.  Musculoskeletal:     Cervical back: Neck supple.     Right lower leg: No edema.     Left lower leg: No edema.     Comments: No peripheral edema.  Skin:    General: Skin is warm and dry.  Neurological:     Mental Status: He is alert.  Psychiatric:        Behavior: Behavior normal.     ED Results / Procedures / Treatments   Labs (all labs ordered are listed, but only abnormal results are displayed) Labs Reviewed  CBC - Abnormal; Notable for the following components:      Result Value   MCH 34.7 (*)    All other components within normal limits  COMPREHENSIVE METABOLIC PANEL - Abnormal; Notable for the following components:   BUN 5 (*)    Calcium 8.5 (*)    Total Protein 6.1 (*)    All other components within normal limits  RESP PANEL BY RT-PCR (FLU A&B, COVID) ARPGX2  LIPASE, BLOOD   BRAIN NATRIURETIC PEPTIDE  CBG MONITORING, ED  TROPONIN I (HIGH SENSITIVITY)  TROPONIN I (HIGH SENSITIVITY)    EKG EKG Interpretation  Date/Time:  Monday May 14 2020 22:03:44 EDT Ventricular Rate:  65 PR Interval:    QRS Duration: 94 QT Interval:  394 QTC Calculation: 410 R Axis:   69 Text Interpretation: Sinus rhythm Abnormal R-wave progression, early transition Borderline repolarization abnormality Minimal ST elevation, anterior leads unchanged from 12/21 Confirmed by Eber Hong ([id number]) on 05/14/2020 10:48:21 PM   Radiology DG Chest Portable 1 View  Result Date: 05/14/2020 CLINICAL DATA:  Chest pain EXAM: PORTABLE CHEST 1 VIEW COMPARISON:  February 07, 2020 FINDINGS: The heart size and mediastinal contours are within normal limits. There is prominence of the central pulmonary vasculature. No large airspace consolidation or effusion. The visualized skeletal structures are unremarkable. IMPRESSION: Mild pulmonary vascular congestion. Electronically Signed   By: Jonna Clark M.D.   On: 05/14/2020 22:59    Procedures Procedures   Medications Ordered in ED Medications  famotidine (PEPCID) IVPB 20 mg premix (20 mg Intravenous New Bag/Given 05/15/20 0241)  ondansetron (ZOFRAN) injection 4 mg (4 mg Intravenous Given 05/14/20 2241)  fentaNYL (SUBLIMAZE) injection 75 mcg (75 mcg Intravenous Given 05/14/20 2242)  sodium chloride 0.9 % bolus 500 mL (500 mLs Intravenous New Bag/Given 05/15/20 0239)    ED Course  I have reviewed the triage vital signs and the nursing notes.  Pertinent labs & imaging results that were available during my care of the patient were reviewed by me and considered in my medical decision making (see chart for details).    MDM Rules/Calculators/A&P                          60 year old male with a history of CAD with multiple prior percutaneous coronary interventions to the right coronary artery (most recent LHC 09/2016 with CTO of RPL branch and L->R  collaterals), hypertension, hyperlipidemia, obstructive sleep apnea) presenting by EMS from home with chest pain, shortness of breath, diaphoresis, nausea, vomiting.  Current episode of chest pain began 30 minutes  prior to arrival, but patient was having intermittent episodes of chest pain since approximately 13:00-14:00 this afternoon.  He has not followed with cardiology in the clinic.  He has not taking any daily medications at home.  Differential diagnosis includes ACS, PE, aortic dissection, acute congestive heart failure, esophageal rupture, pancreatitis, gastritis, pneumonia, COVID-19.  Initially hypertensive with EMS with systolic pressure of 160.  Blood pressure is now 101/75 in the ER.  There is no hypoxia, tachypnea or tachycardia.  He is afebrile.  EKG unchanged from previous.  Delta troponin is not elevated. HEART score is 5.  BNP is normal.  No metabolic derangements.  Chest x-ray is unremarkable.  On reevaluation, he continues to endorse 5 out of 10 pain.  Blood pressure has been soft at 97/66.  EMS had initially reported a systolic pressure 160 and 130/76 following nitro.  We will give 500 cc fluid bolus since I do not have a previous echo to check the patient's EF.  However, he does not appear volume overloaded today.  Patient reported minimal improvement after fentanyl administration.  We will avoid nitroglycerin given patient's blood pressure at this time.  Consult to cardiology since he is a high risk patient with an extensive cardiac history and has been noncompliant with his home medications. Dr. Julianne Handler will accept the patient for admission.  The patient appears reasonably stabilized for admission considering the current resources, flow, and capabilities available in the ED at this time, and I doubt any other [facility] requiring further screening and/or treatment in the ED prior to admission.  Final Clinical Impression(s) / ED Diagnoses Final diagnoses:  Chest pain, unspecified type     Rx / DC Orders ED Discharge Orders    None       Barkley Boards, PA-C 05/15/20 0259    Eber Hong, MD 05/17/20 1501

## 2020-05-15 ENCOUNTER — Other Ambulatory Visit: Payer: Self-pay | Admitting: Cardiology

## 2020-05-15 ENCOUNTER — Observation Stay (HOSPITAL_BASED_OUTPATIENT_CLINIC_OR_DEPARTMENT_OTHER): Payer: Medicaid Other

## 2020-05-15 DIAGNOSIS — E7800 Pure hypercholesterolemia, unspecified: Secondary | ICD-10-CM

## 2020-05-15 DIAGNOSIS — Z720 Tobacco use: Secondary | ICD-10-CM | POA: Diagnosis not present

## 2020-05-15 DIAGNOSIS — R079 Chest pain, unspecified: Secondary | ICD-10-CM | POA: Diagnosis not present

## 2020-05-15 DIAGNOSIS — I10 Essential (primary) hypertension: Secondary | ICD-10-CM | POA: Diagnosis not present

## 2020-05-15 DIAGNOSIS — I200 Unstable angina: Secondary | ICD-10-CM | POA: Diagnosis not present

## 2020-05-15 DIAGNOSIS — I25110 Atherosclerotic heart disease of native coronary artery with unstable angina pectoris: Secondary | ICD-10-CM

## 2020-05-15 LAB — CBC
HCT: 41.9 % (ref 39.0–52.0)
Hemoglobin: 14.6 g/dL (ref 13.0–17.0)
MCH: 34.9 pg — ABNORMAL HIGH (ref 26.0–34.0)
MCHC: 34.8 g/dL (ref 30.0–36.0)
MCV: 100.2 fL — ABNORMAL HIGH (ref 80.0–100.0)
Platelets: 166 10*3/uL (ref 150–400)
RBC: 4.18 MIL/uL — ABNORMAL LOW (ref 4.22–5.81)
RDW: 12.9 % (ref 11.5–15.5)
WBC: 7.1 10*3/uL (ref 4.0–10.5)
nRBC: 0 % (ref 0.0–0.2)

## 2020-05-15 LAB — ECHOCARDIOGRAM COMPLETE
AR max vel: 2.9 cm2
AV Area VTI: 3.31 cm2
AV Area mean vel: 3.02 cm2
AV Mean grad: 3 mmHg
AV Peak grad: 6.4 mmHg
Ao pk vel: 1.26 m/s
Area-P 1/2: 3.74 cm2
Height: 70 in
S' Lateral: 3.6 cm
Single Plane A4C EF: 60 %
Weight: 2480 oz

## 2020-05-15 LAB — BRAIN NATRIURETIC PEPTIDE: B Natriuretic Peptide: 73.6 pg/mL (ref 0.0–100.0)

## 2020-05-15 LAB — LIPID PANEL
Cholesterol: 125 mg/dL (ref 0–200)
HDL: 52 mg/dL (ref >40)
LDL Cholesterol: 57 mg/dL (ref 0–99)
Total CHOL/HDL Ratio: 2.4 RATIO
Triglycerides: 80 mg/dL (ref <150)
VLDL: 16 mg/dL (ref 0–40)

## 2020-05-15 LAB — HEPARIN LEVEL (UNFRACTIONATED): Heparin Unfractionated: 0.24 IU/mL — ABNORMAL LOW (ref 0.30–0.70)

## 2020-05-15 LAB — HEMOGLOBIN A1C
Hgb A1c MFr Bld: 5.4 % (ref 4.8–5.6)
Mean Plasma Glucose: 108.28 mg/dL

## 2020-05-15 LAB — TSH: TSH: 2.111 u[IU]/mL (ref 0.350–4.500)

## 2020-05-15 LAB — TROPONIN I (HIGH SENSITIVITY): Troponin I (High Sensitivity): 7 ng/L (ref <18)

## 2020-05-15 LAB — RESP PANEL BY RT-PCR (FLU A&B, COVID) ARPGX2
Influenza A by PCR: NEGATIVE
Influenza B by PCR: NEGATIVE
SARS Coronavirus 2 by RT PCR: NEGATIVE

## 2020-05-15 LAB — HIV ANTIBODY (ROUTINE TESTING W REFLEX): HIV Screen 4th Generation wRfx: NONREACTIVE

## 2020-05-15 MED ORDER — FAMOTIDINE IN NACL 20-0.9 MG/50ML-% IV SOLN
20.0000 mg | Freq: Once | INTRAVENOUS | Status: AC
  Administered 2020-05-15: 20 mg via INTRAVENOUS
  Filled 2020-05-15: qty 50

## 2020-05-15 MED ORDER — NITROGLYCERIN 0.4 MG SL SUBL
0.4000 mg | SUBLINGUAL_TABLET | SUBLINGUAL | Status: DC | PRN
Start: 2020-05-15 — End: 2020-05-15

## 2020-05-15 MED ORDER — CLOPIDOGREL BISULFATE 75 MG PO TABS
75.0000 mg | ORAL_TABLET | Freq: Every day | ORAL | Status: DC
  Administered 2020-05-15: 75 mg via ORAL
  Filled 2020-05-15: qty 1

## 2020-05-15 MED ORDER — ASPIRIN 81 MG PO TBEC: 81.0000 mg | DELAYED_RELEASE_TABLET | Freq: Every day | ORAL | 6 refills | Status: DC

## 2020-05-15 MED ORDER — CLOPIDOGREL BISULFATE 75 MG PO TABS: 75.0000 mg | ORAL_TABLET | Freq: Every day | ORAL | 3 refills | Status: DC

## 2020-05-15 MED ORDER — HEPARIN BOLUS VIA INFUSION
4000.0000 [IU] | Freq: Once | INTRAVENOUS | Status: AC
  Administered 2020-05-15: 4000 [IU] via INTRAVENOUS
  Filled 2020-05-15: qty 4000

## 2020-05-15 MED ORDER — ONDANSETRON HCL 4 MG/2ML IJ SOLN: 4.0000 mg | Freq: Four times a day (QID) | INTRAMUSCULAR | Status: DC | PRN

## 2020-05-15 MED ORDER — ATORVASTATIN CALCIUM 80 MG PO TABS: 80.0000 mg | ORAL_TABLET | Freq: Every day | ORAL | 6 refills | Status: DC

## 2020-05-15 MED ORDER — ACETAMINOPHEN 325 MG PO TABS: 650.0000 mg | ORAL_TABLET | ORAL | Status: DC | PRN

## 2020-05-15 MED ORDER — ISOSORBIDE MONONITRATE ER 30 MG PO TB24: 30.0000 mg | ORAL_TABLET | Freq: Every day | ORAL | Status: DC

## 2020-05-15 MED ORDER — ASPIRIN EC 81 MG PO TBEC: 81.0000 mg | DELAYED_RELEASE_TABLET | Freq: Every day | ORAL | Status: DC

## 2020-05-15 MED ORDER — ATORVASTATIN CALCIUM 40 MG PO TABS
80.0000 mg | ORAL_TABLET | Freq: Every day | ORAL | Status: DC
  Administered 2020-05-15: 80 mg via ORAL
  Filled 2020-05-15: qty 2

## 2020-05-15 MED ORDER — SODIUM CHLORIDE 0.9 % IV BOLUS
500.0000 mL | Freq: Once | INTRAVENOUS | Status: AC
  Administered 2020-05-15: 500 mL via INTRAVENOUS

## 2020-05-15 MED ORDER — HEPARIN (PORCINE) 25000 UT/250ML-% IV SOLN
1000.0000 [IU]/h | INTRAVENOUS | Status: DC
  Administered 2020-05-15: 1000 [IU]/h via INTRAVENOUS
  Filled 2020-05-15: qty 250

## 2020-05-15 MED FILL — ASPIRIN LOW DOSE 81 MG TBEC: 81 | 90 days supply | Qty: 90 | Fill #0

## 2020-05-15 MED FILL — ATORVASTATIN CALCIUM 80 MG: 80 | 90 days supply | Qty: 90 | Fill #0

## 2020-05-15 MED FILL — CLOPIDOGREL 75 MG TABLET: 75 | 90 days supply | Qty: 90 | Fill #0

## 2020-05-15 NOTE — Progress Notes (Signed)
Progress Note  Patient Name: Connor Baker Date of Encounter: 05/15/2020  [facility] Cardiologist: Verne Carrow, MD   Subjective   Asking to go home today. No more chest pain. Reviewed importance of taking medication to prevent future events. Does not wish to stay for cath or other testing, having echo at the time of evaluation.  Inpatient Medications    Scheduled Meds: . [START ON 05/16/2020] aspirin EC  81 mg Oral Daily  . atorvastatin  80 mg Oral Daily  . clopidogrel  75 mg Oral Daily   Continuous Infusions: . heparin 1,000 Units/hr (05/15/20 0353)   PRN Meds: acetaminophen, nitroGLYCERIN, ondansetron (ZOFRAN) IV   Vital Signs    Vitals:   05/15/20 0610 05/15/20 0615 05/15/20 0630 05/15/20 0730  BP: 96/65 102/71 100/71 104/73  Pulse: (!) 58 (!) 56 (!) 53 (!) 56  Resp: 14 15 11 15   Temp:      TempSrc:      SpO2: 93% 95% 94% 96%  Weight:      Height:        Intake/Output Summary (Last 24 hours) at 05/15/2020 1036 Last data filed at 05/15/2020 05/17/2020 Gross per 24 hour  Intake -  Output 1150 ml  Net -1150 ml   Last 3 Weights 05/14/2020 10/12/2019 06/16/2019  Weight (lbs) 155 lb 154 lb 154 lb  Weight (kg) 70.308 kg 69.854 kg 69.854 kg      Telemetry    NSR - Personally Reviewed  ECG    Sinus bradycardia at 58 bpm- Personally Reviewed  Physical Exam   GEN: No acute distress.   Neck: No JVD Cardiac: RRR, no murmurs, rubs, or gallops.  Respiratory: Clear to auscultation bilaterally. GI: Soft, nontender, non-distended  MS: No edema; No deformity. Neuro:  Nonfocal  Psych: Normal affect   Labs    High Sensitivity Troponin:   Recent Labs  Lab 05/14/20 2211 05/15/20 0018  TROPONINIHS 6 7      Chemistry Recent Labs  Lab 05/14/20 2211  NA 136  K 3.5  CL 105  CO2 22  GLUCOSE 87  BUN 5*  CREATININE 0.86  CALCIUM 8.5*  PROT 6.1*  ALBUMIN 3.6  AST 22  ALT 14  ALKPHOS 64  BILITOT 0.7  GFRNONAA >60  ANIONGAP 9      Hematology Recent Labs  Lab 05/14/20 2211 05/15/20 0359  WBC 8.1 7.1  RBC 4.52 4.18*  HGB 15.7 14.6  HCT 45.2 41.9  MCV 100.0 100.2*  MCH 34.7* 34.9*  MCHC 34.7 34.8  RDW 12.7 12.9  PLT 186 166    BNP Recent Labs  Lab 05/14/20 2230  BNP 73.6     DDimer No results for input(s): DDIMER in the last 168 hours.   Radiology    DG Chest Portable 1 View  Result Date: 05/14/2020 CLINICAL DATA:  Chest pain EXAM: PORTABLE CHEST 1 VIEW COMPARISON:  February 07, 2020 FINDINGS: The heart size and mediastinal contours are within normal limits. There is prominence of the central pulmonary vasculature. No large airspace consolidation or effusion. The visualized skeletal structures are unremarkable. IMPRESSION: Mild pulmonary vascular congestion. Electronically Signed   By: February 09, 2020 M.D.   On: 05/14/2020 22:59    Cardiac Studies   Echo in process  Patient Profile     60 y.o. male with PMH CAD with multiple prior PCI of RCA (CTO RPL w/ L-->R collaterals 12/2017), medication nonadherence, HLD presenting with chest pain.  Assessment & Plan  Chest pain, concerning for unstable angina Known CAD Hyperlipidemia -hsTnI unremarkable -chest pain resolved -we discussed cath vs stress vs other evaluation. Having echo now. He wants to go home and follow up as an outpatient. -we discussed the importance of taking medications at home. Blood pressure too low for beta blocker or nitrate. We will focus on aspirin, clopidogrel, and atorvastatin, which are all generic. We will ask the [facility] pharmacy to help [place] get him these medications today -instructed on red flag warning signs that need immediate medical attention  We will work on his discharge paperwork and follow up.  For questions or updates, please contact [facility] Please consult [email or web address] for contact info under        Signed, Jodelle Red, MD  05/15/2020, 10:36 AM

## 2020-05-15 NOTE — Discharge Summary (Signed)
Discharge Summary    Patient ID: TAEQUAN STOCKHAUSEN MRN: [id number]; DOB: 06/20/1960  Admit date: 05/14/2020 Discharge date: 05/15/2020  PCP:  Bridget Hartshorn, DO   [facility]  Cardiologist:  Verne Carrow, MD   Discharge Diagnoses    Active Problems:   Hypertension, essential   CAD (coronary artery disease), native coronary artery   Hyperlipidemia   Tobacco abuse   Chest pain  Diagnostic Studies/Procedures    Echocardiogram 05/15/20: Pending results at discharge however reviewed by Dr. Cristal Deer with no acute changes in LV function  History of Present Illness     Connor Baker is a 60 y.o. male with with multiple prior PCI of RCA (CTO RPL w/ L-->R colalterals 12/2017), medication nonadherence due to cost, HTN, HLD presenting with chest pain.  Hospital Course     Pt presented with chest pain, initially activated as STEMI from the field.  This was canceled promptly on review of ECG in the ED which was stable from prior.  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  He had associated nausea, diaphoresis, and some mild shortness of breath.  Given the pain was persistent and similar to prior MI symptoms he presented to ED.  ECG was unchanged from prior.  Serial hstroponin >4 hr after onset of symptoms <10.    He had a very similar presentation in 2019.  At that time he was resumed on DAPT, statin, and low dose metoprolol with plan for medical management and invasive strategy if persistent symptoms.  He reported significant financial difficulty and inability to afford any of his medications.   He was then seen by Dr. Cristal Deer and reported that he wished to be discharge home without further testing. Reviewed importance of taking medication to prevent future events. Echocardiogram was completed which showed stable LV function and no overt changes.    Due to significant financial difficulties, plan is to restart medications to include ASA, Plavix and statin therapies which are generic and should be able to afford. I will also attempt to have his medications sent to our [facility] pharmacy so he can leave the Ed with medications in hand. Unable to add beta blocker or long acting titrates due to BPs. Instructed on red flag warnings that need immediate medical attention.     ED issues as below:  Chest pain, concerning for unstable angina: -ASA 81 mg daily, Plavix 75mg  PO QD, atorvastatin 80mg  PO QD -Unable to add nitrates or beta blocker due to soft BPs  HTN: -Hypertensive at presentation, however BPs remained soft  HLD: -High intensity statin as above  Tobacco use: -Discussed importance of cessation/reduction  Consultants: None   The patient was seen and examined by Dr. who feels that he is stable and ready for discharge today, 05/15/20. We will arrange follow up.   Did the patient have an acute coronary syndrome (MI, NSTEMI, STEMI, etc) this admission?:  No                               Did the patient have a percutaneous coronary intervention (stent / angioplasty)?:  No.    Discharge Vitals Blood pressure 119/81, pulse 69, temperature 97.7 F (36.5 C), temperature source Oral, resp. rate 15, height 5\' 10"  (1.778 m), weight 70.3 kg, SpO2 98 %.  Filed Weights  05/14/20 2204  Weight: 70.3 kg   Labs & Radiologic Studies    CBC Recent Labs    05/14/20 2211 05/15/20 0359  WBC 8.1 7.1  HGB 15.7 14.6  HCT 45.2 41.9  MCV 100.0 100.2*  PLT 186 166   Basic Metabolic Panel Recent Labs    76/19/50 2211  NA 136  K 3.5  CL 105  CO2 22  GLUCOSE 87  BUN 5*  CREATININE 0.86  CALCIUM 8.5*   Liver Function Tests Recent Labs    05/14/20 2211  AST 22  ALT 14  ALKPHOS 64  BILITOT 0.7  PROT 6.1*  ALBUMIN 3.6   Recent Labs    05/14/20 2211  LIPASE 33   High Sensitivity Troponin:   Recent Labs  Lab  05/14/20 2211 05/15/20 0018  TROPONINIHS 6 7    BNP Invalid input(s): POCBNP D-Dimer No results for input(s): DDIMER in the last 72 hours. Hemoglobin A1C Recent Labs    05/15/20 0359  HGBA1C 5.4   Fasting Lipid Panel Recent Labs    05/15/20 0359  CHOL 125  HDL 52  LDLCALC 57  TRIG 80  CHOLHDL 2.4   Thyroid Function Tests No results for input(s): TSH, T4TOTAL, T3FREE, THYROIDAB in the last 72 hours.  Invalid input(s): FREET3 _____________  DG Chest Portable 1 View  Result Date: 05/14/2020 CLINICAL DATA:  Chest pain EXAM: PORTABLE CHEST 1 VIEW COMPARISON:  February 07, 2020 FINDINGS: The heart size and mediastinal contours are within normal limits. There is prominence of the central pulmonary vasculature. No large airspace consolidation or effusion. The visualized skeletal structures are unremarkable. IMPRESSION: Mild pulmonary vascular congestion. Electronically Signed   By: Jonna Clark M.D.   On: 05/14/2020 22:59   Disposition   Pt is being discharged home today in good condition.  Follow-up Plans & Appointments    Follow-up Information    Dyann Kief, PA-C Follow up on 06/12/2020.   Specialty: Cardiology Why: at 7:45am  Contact information: [address] [place] [address] [phone]              Discharge Instructions    Call MD for:  difficulty breathing, headache or visual disturbances   Complete by: As directed    Call MD for:  extreme fatigue   Complete by: As directed    Call MD for:  hives   Complete by: As directed    Call MD for:  persistant dizziness or light-headedness   Complete by: As directed    Call MD for:  persistant nausea and vomiting   Complete by: As directed    Call MD for:  redness, tenderness, or signs of infection (pain, swelling, redness, odor or green/yellow discharge around incision site)   Complete by: As directed    Call MD for:  severe uncontrolled pain   Complete by: As directed    Call MD  for:  temperature >100.4   Complete by: As directed    Diet - low sodium heart healthy   Complete by: As directed    Increase activity slowly   Complete by: As directed      Discharge Medications   Allergies as of 05/15/2020   No Known Allergies     Medication List    TAKE these medications   aspirin 81 MG EC tablet Take 1 tablet (81 mg total) by mouth daily. Swallow whole. Start taking on: May 16, 2020   atorvastatin 80 MG tablet Commonly known  as: LIPITOR Take 1 tablet (80 mg total) by mouth daily. Start taking on: May 16, 2020   clopidogrel 75 MG tablet Commonly known as: PLAVIX Take 1 tablet (75 mg total) by mouth daily.   nitroGLYCERIN 0.4 MG SL tablet Commonly known as: NITROSTAT Place 1 tablet (0.4 mg total) under the tongue every 5 (five) minutes x 3 doses as needed for chest pain.       Outstanding Labs/Studies   None   Duration of Discharge Encounter   Greater than 30 minutes including physician time.  Signed, Georgie Chard, NP 05/15/2020, 11:11 AM

## 2020-05-15 NOTE — H&P (Signed)
Cardiology History & Physical    Patient ID: Connor Baker MRN: [id number], DOB/AGE: [birth year]   Admit date: 05/14/2020  Primary Physician: Bridget Hartshorn, DO Primary Cardiologist: Verne Carrow, MD  Patient Profile    60 year old male with CAD with multiple prior PCI of RCA (CTO RPL w/ L-->R colalterals 12/2017), medication nonadherence due to cost, HTN, HLD presenting with chest pain.  History of Present Illness    60 year old male with history of coronary artery disease with multiple prior PCI to distal RCA territory, HTN, HLD, ongoing tobacco use who presented this evening with chest pain.  Was initially activated as STEMI from the field.  This was canceled promptly on review of ECG in the ED which was stable from prior.  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  He had associated nausea, diaphoresis, and some mild shortness of breath.  No cough, fevers, chills, nausea, vomiting.  He is fully vaccinated.  Given the pain was persistent and similar to prior MI symptoms he presented to ED.  ECG was unchanged from prior.  Serial hstroponin >4 hr after onset of symptoms <10.    He had a very similar presentation in 2019.  At that time he was resumed on DAPT, statin, and low dose metoprolol with plan for medical management and invasive strategy if persistent symptoms.  He reports significant financial difficulty and inability to afford any of his medications.  I explained to him that if he's going to take anything, aspirin and atorvastatin are dirt cheap and he should take these.  Also reviewed number one priority is smoking cessation.  Reports ongoing 3-4/10 chest pain at the time of my evaluation, heart rate in the 60s with blood pressure 90/50 - 100/60.  Past Medical History   Past Medical History:  Diagnosis Date  . Arthritis    "legs" (08/28/2016)  . CHF  (congestive heart failure) (HCC)   . Chronic lower back pain   . Coronary artery disease   . Depression   . GERD (gastroesophageal reflux disease)   . High cholesterol   . Hypertension   . MI (myocardial infarction) (HCC) 1992; 1994; 1995  . On home oxygen therapy    "4L just at night w/my CPAP" (08/28/2016)  . OSA (obstructive sleep apnea)    w/oxygen" (08/28/2016)  . Stroke [facility]) 2005   "mild one; faded my memory" (08/28/2016)    Past Surgical History:  Procedure Laterality Date  . ANKLE FRACTURE SURGERY Right 2016  . CORONARY ANGIOPLASTY    . CORONARY ANGIOPLASTY WITH STENT PLACEMENT  1992 - 2017   "I've got a total of 16 stents in me" (08/28/2016)  . CORONARY BALLOON ANGIOPLASTY Right 08/28/2016   Procedure: Coronary Balloon Angioplasty;  Surgeon: Lyn Records, MD;  Location: [facility] INVASIVE CV LAB;  Service: Cardiovascular;  Laterality: Right;  . FRACTURE SURGERY    . LEFT HEART CATH AND CORONARY ANGIOGRAPHY N/A 08/28/2016   Procedure: Left Heart Cath and Coronary Angiography;  Surgeon: Lyn Records, MD;  Location: [facility] INVASIVE CV LAB;  Service: Cardiovascular;  Laterality: N/A;  . LEFT HEART CATH AND CORONARY ANGIOGRAPHY N/A 10/24/2016   Procedure: LEFT HEART CATH AND CORONARY ANGIOGRAPHY;  Surgeon: Swaziland, Peter M, MD;  Location: [facility] INVASIVE CV LAB;  Service: Cardiovascular;  Laterality: N/A;  . TOTAL HIP ARTHROPLASTY Right 1990  Allergies No Known Allergies  Home Medications    Prior to Admission medications   Medication Sig Start Date End Date Taking? Authorizing Provider  nitroGLYCERIN (NITROSTAT) 0.4 MG SL tablet Place 1 tablet (0.4 mg total) under the tongue every 5 (five) minutes x 3 doses as needed for chest pain. 01/02/18  Yes Filbert Schilder, NP    Family History    Family History  Problem Relation Age of Onset  . Heart attack Father   . Diabetes Mother    He indicated that the status of his mother is unknown. He indicated that his father is deceased.   Social  History    Social History   Socioeconomic History  . Marital status: Single    Spouse name: Not on file  . Number of children: Not on file  . Years of education: Not on file  . Highest education level: Not on file  Occupational History  . 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 Smoking status: Current Every Day Smoker    Packs/day: 1.00    Years: 1.00    Pack years: 1.00    Types: Cigarettes  . Smokeless tobacco: Never Used  . Tobacco comment: "quit smoking in the 1990s"/ started back smoking at first of 2019  Vaping Use  . Vaping Use: Never used  Substance and Sexual Activity  . Alcohol use: Not Currently    Comment: 08/28/2016 "stopped 10-15 yr ago"  . Drug use: Not Currently  . Sexual activity: Yes  Other Topics Concern  . Not on file  Social History Narrative   ** Merged History Encounter **       Social Determinants of Health   Financial Resource Strain: Not on file  Food Insecurity: Not on file  Transportation Needs: Not on file  Physical Activity: Not on file  Stress: Not on file  Social Connections: Not on file  Intimate Partner Violence: Not on file     Review of Systems    General:  No chills, fever, night sweats or weight changes.  Cardiovascular:   chest pain, dyspnea, no edema, orthopnea, palpitations, paroxysmal nocturnal dyspnea. Dermatological: No rash, lesions/masses Respiratory: No cough, dyspnea Urologic: No hematuria, dysuria Abdominal:   No nausea, vomiting, diarrhea, bright red blood per rectum, melena, or hematemesis Neurologic:  No visual changes, wkns, changes in mental status. All other systems reviewed and are otherwise negative except as noted above.  Physical Exam    BP 94/66   Pulse (!) 52   Temp 97.7 F (36.5 C) (Oral)   Resp 15   Ht 5\' 10"  (1.778 m)   Wt 70.3 kg   SpO2 94%   BMI 22.24 kg/m  General: Alert, NAD HEENT: Normal  Neck: No bruits or JVD. Lungs:  Resp regular and unlabored, CTA bilaterally. Heart: Regular  rhythm, no s3, s4, or murmurs. Abdomen: Soft, non-tender, non-distended, BS +.  Extremities: Warm. No clubbing, cyanosis or edema. DP/PT/Radials 2+ and equal bilaterally. Psych: Normal affect. Neuro: Alert and oriented. No gross focal deficits. No abnormal movements.  Labs    Troponin (Point of Care Test) No results for input(s): TROPIPOC in the last 72 hours. No results for input(s): CKTOTAL, CKMB, TROPONINI in the last 72 hours. Lab Results  Component Value Date   WBC 8.1 05/14/2020   HGB 15.7 05/14/2020   HCT 45.2 05/14/2020   MCV 100.0 05/14/2020   PLT 186 05/14/2020    Recent Labs  Lab 05/14/20 2211  NA 136  K 3.5  CL 105  CO2 22  BUN 5*  CREATININE 0.86  CALCIUM 8.5*  PROT 6.1*  BILITOT 0.7  ALKPHOS 64  ALT 14  AST 22  GLUCOSE 87   Lab Results  Component Value Date   CHOL 125 08/28/2016   HDL 74 08/28/2016   LDLCALC 40 08/28/2016   TRIG 57 08/28/2016   No results found for: [facility]   Radiology Studies    DG Chest Portable 1 View  Result Date: 05/14/2020 CLINICAL DATA:  Chest pain EXAM: PORTABLE CHEST 1 VIEW COMPARISON:  February 07, 2020 FINDINGS: The heart size and mediastinal contours are within normal limits. There is prominence of the central pulmonary vasculature. No large airspace consolidation or effusion. The visualized skeletal structures are unremarkable. IMPRESSION: Mild pulmonary vascular congestion. Electronically Signed   By: Jonna Clark M.D.   On: 05/14/2020 22:59    ECG & Cardiac Imaging   NSR with some inferior repolarization abnormalities, stable from prior, probably old inferior MI w/ inferior Q-wave and early precordial transition.  Stable from prior on personal review of tracings.  Coronary angiogram 09/2016 w/ Dr. Swaziland 1 RPLB 100%, mid to distal RCA 50% stenosis, ostial lcx 25%, mRCA 30%, ostial to proximal LAD 40%, normal LVEDP.  Stable proximal PLOM from balloon angioplasty in 08/2016.  08/2016 distal RCA ISR 85% on LHC in at  least 2nd layer stent with left to right collateralization, calcified pLAD, 35-40% ostial LCx.  Cutting balloon to 85% ISR --> 50% no stenting TIMI 3 flow subsequently.   Assessment & Plan    60 year old male with history of coronary artery disease with multiple prior RCA interventions, last 2018 w/ PTCA of ISR of distal RCA at least 2nd layer stent w/ collateralization distally and calcific non-obstructive pLAD lesion, presenting with possibly cardiac chest pain.  Troponin is negative and ECG unchanged from prior.  This occurs in the context of medication nonadherence.  Would have high threshold for intervention with ongoing issues with nonadherence, primarily related to cost.  Short DAPT stent if intervention is pursued.  Will plan for medical management of unstable angina and, if refractory symptoms, can consider invasive approach.  Smoking cessation and low cost medical regimen will be essential.  Reviewed with patient we will work with him to facilitate a low pill burden and cost.  #Unstable angina - ASA 81 mg daily - Maximal intensity statin now and indefinitely - Heparin infusion ACS nomogram - Nitroglycerin PRN - Start isosorbide mononitrate 30 mg daily - If BP, HR tolerate, start low dose BB - TTE, lipid profile, A1C, TSH - NPO in event of invasive angiogram today.  #HTN - Hypertensive at presentation, now low normal blood pressure - Antianginals as above  #HLD - High intensity statin as above  #Tobacco use - Discussed importance of cessation/reduction  Nutrition: NPO DVT ppx:  Heparin infusion GI ppx: None Advanced Care Planning: Full code Dispo: Observation, cardiac telemetry  Signed, Regino Schultze, MD 05/15/2020, 4:07 AM

## 2020-05-15 NOTE — ED Notes (Signed)
Patient verbalizes understanding of discharge instructions. Opportunity for questioning and answers were provided. Armband removed by staff, pt discharged from ED.  

## 2020-05-15 NOTE — Progress Notes (Signed)
ANTICOAGULATION CONSULT NOTE - Initial Consult  Pharmacy Consult for Heparin Indication: chest pain/ACS  No Known Allergies  Patient Measurements: Height: 5\' 10"  (177.8 cm) Weight: 70.3 kg (155 lb) IBW/kg (Calculated) : 73 Heparin Dosing Weight: TBW  Vital Signs: Temp: 97.7 F (36.5 C) (03/22 0233) Temp Source: Oral (03/22 0233) BP: 100/76 (03/22 0245) Pulse Rate: 58 (03/22 0245)  Labs: Recent Labs    05/14/20 2211 05/15/20 0018  HGB 15.7  --   HCT 45.2  --   PLT 186  --   CREATININE 0.86  --   TROPONINIHS 6 7    Estimated Creatinine Clearance: 89.7 mL/min (by C-G formula based on SCr of 0.86 mg/dL).   Medical History: Past Medical History:  Diagnosis Date  . Arthritis    "legs" (08/28/2016)  . CHF (congestive heart failure) (HCC)   . Chronic lower back pain   . Coronary artery disease   . Depression   . GERD (gastroesophageal reflux disease)   . High cholesterol   . Hypertension   . MI (myocardial infarction) (HCC) 1992; 1994; 1995  . On home oxygen therapy    "4L just at night w/my CPAP" (08/28/2016)  . OSA (obstructive sleep apnea)    w/oxygen" (08/28/2016)  . Stroke [facility]) 2005   "mild one; faded my memory" (08/28/2016)    Medications:  SL NTG PTA  Assessment: 60 y.o. Baker presents with CP. To begin heparin for r/o ACS. No AC PTA. CBC ok on admission.  Goal of Therapy:  Heparin level 0.3-0.7 units/ml Monitor platelets by anticoagulation protocol: Yes   Plan:  Heparin IV bolus 4000 units Heparin gtt at 1000 units/hr Will f/u heparin level in 6 hours Daily heparin level and CBC  77, PharmD, BCPS Please see amion for complete clinical pharmacist phone list 05/15/2020,3:05 AM

## 2020-06-05 NOTE — Progress Notes (Deleted)
Cardiology Office Note    Date:  06/05/2020   ID:  Connor Baker, DOB 14-May-1960, MRN [id number]   PCP:  No primary care provider on file.   [facility]  Cardiologist:  Verne Carrow, MD *** Advanced Practice Provider:  No care team member to display Electrophysiologist:  None   [phone]   No chief complaint on file.   History of Present Illness:  Connor Baker is a 60 y.o. male with history of CAD S/P multiple prior PCI of RCA (CTO RPL w/ L-->R colalterals 12/2017), medication nonadherence due to cost, HTN, HLD   Patient discharged form the hospital 05/15/20 after admission with chest pain, no EKG changes and troponins negative. He declined further work up and was restarted on his meds. Was hypertensive at presentation. Echo LVEF 60-65%   Past Medical History:  Diagnosis Date  . Arthritis    "legs" (08/28/2016)  . CHF (congestive heart failure) (HCC)   . Chronic lower back pain   . Coronary artery disease   . Depression   . GERD (gastroesophageal reflux disease)   . High cholesterol   . Hypertension   . MI (myocardial infarction) (HCC) 1992; 1994; 1995  . On home oxygen therapy    "4L just at night w/my CPAP" (08/28/2016)  . OSA (obstructive sleep apnea)    w/oxygen" (08/28/2016)  . Stroke [facility]) 2005   "mild one; faded my memory" (08/28/2016)    Past Surgical History:  Procedure Laterality Date  . ANKLE FRACTURE SURGERY Right 2016  . CORONARY ANGIOPLASTY    . CORONARY ANGIOPLASTY WITH STENT PLACEMENT  1992 - 2017   "I've got a total of 16 stents in me" (08/28/2016)  . CORONARY BALLOON ANGIOPLASTY Right 08/28/2016   Procedure: Coronary Balloon Angioplasty;  Surgeon: Lyn Records, MD;  Location: [facility] INVASIVE CV LAB;  Service: Cardiovascular;  Laterality: Right;  . FRACTURE SURGERY    . LEFT HEART CATH AND CORONARY ANGIOGRAPHY N/A 08/28/2016   Procedure: Left Heart Cath and Coronary Angiography;  Surgeon: Lyn Records, MD;  Location: [facility]  INVASIVE CV LAB;  Service: Cardiovascular;  Laterality: N/A;  . LEFT HEART CATH AND CORONARY ANGIOGRAPHY N/A 10/24/2016   Procedure: LEFT HEART CATH AND CORONARY ANGIOGRAPHY;  Surgeon: [place], Peter M, MD;  Location: [facility] INVASIVE CV LAB;  Service: Cardiovascular;  Laterality: N/A;  . TOTAL HIP ARTHROPLASTY Right 1990    Current Medications: No outpatient medications have been marked as taking for the 06/12/20 encounter (Appointment) with Dyann Kief, PA-C.     Allergies:   Patient has no known allergies.   Social History   Socioeconomic History  . Marital status: Single    Spouse name: Not on file  . Number of children: Not on file  . Years of education: Not on file  . Highest education level: Not on file  Occupational History  . 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 Smoking status: Current Every Day Smoker    Packs/day: 1.00    Years: 1.00    Pack years: 1.00    Types: Cigarettes  . Smokeless tobacco: Never Used  . Tobacco comment: "quit smoking in the 1990s"/ started back smoking at first of 2019  Vaping Use  . Vaping Use: Never used  Substance and Sexual Activity  . Alcohol use: Not Currently    Comment: 08/28/2016 "stopped 10-15 yr ago"  . Drug use: Not Currently  . Sexual activity: Yes  Other Topics Concern  .  Not on file  Social History Narrative   ** Merged History Encounter **       Social Determinants of Health   Financial Resource Strain: Not on file  Food Insecurity: Not on file  Transportation Needs: Not on file  Physical Activity: Not on file  Stress: Not on file  Social Connections: Not on file     Family History:  The patient's ***family history includes Diabetes in his mother; Heart attack in his father.   ROS:   Please see the history of present illness.    ROS All other systems reviewed and are negative.   PHYSICAL EXAM:   VS:  There were no vitals taken for this visit.  Physical Exam  GEN: Well nourished, well developed, in no acute  distress  HEENT: normal  Neck: no JVD, carotid bruits, or masses Cardiac:RRR; no murmurs, rubs, or gallops  Respiratory:  clear to auscultation bilaterally, normal work of breathing GI: soft, nontender, nondistended, + BS Ext: without cyanosis, clubbing, or edema, Good distal pulses bilaterally MS: no deformity or atrophy  Skin: warm and dry, no rash Neuro:  Alert and Oriented x 3, Strength and sensation are intact Psych: euthymic mood, full affect  Wt Readings from Last 3 Encounters:  05/14/20 155 lb (70.3 kg)  10/12/19 154 lb (69.9 kg)  06/16/19 154 lb (69.9 kg)      Studies/Labs Reviewed:   EKG:  EKG is*** ordered today.  The ekg ordered today demonstrates ***  Recent Labs: 05/14/2020: ALT 14; B Natriuretic Peptide 73.6; BUN 5; Creatinine, Ser 0.86; Potassium 3.5; Sodium 136 05/15/2020: Hemoglobin 14.6; Platelets 166; TSH 2.111   Lipid Panel    Component Value Date/Time   CHOL 125 05/15/2020 0359   TRIG 80 05/15/2020 0359   HDL 52 05/15/2020 0359   CHOLHDL 2.4 05/15/2020 0359   VLDL 16 05/15/2020 0359   LDLCALC 57 05/15/2020 0359    Additional studies/ records that were reviewed today include:  Echo 05/15/20   1. Left ventricular ejection fraction, by estimation, is 60 to 65%. The  left ventricle has normal function. The left ventricle has no regional  wall motion abnormalities. Left ventricular diastolic parameters were  normal. The average left ventricular  global longitudinal strain is -19.4 %. The global longitudinal strain is  normal.   2. Right ventricular systolic function is normal. The right ventricular  size is normal. There is normal pulmonary artery systolic pressure. The  estimated right ventricular systolic pressure is 23.6 mmHg.   3. The mitral valve is grossly normal. No evidence of mitral valve  regurgitation. No evidence of mitral stenosis.   4. The aortic valve is tricuspid. There is mild thickening of the aortic  valve. Aortic valve  regurgitation is not visualized. No aortic stenosis is  present.   5. The inferior vena cava is normal in size with greater than 50%  respiratory variability, suggesting right atrial pressure of 3 mmHg.      Risk Assessment/Calculations:   {Does this patient have ATRIAL FIBRILLATION?[phone]}     ASSESSMENT:    1. Chest pain, unspecified type   2. Essential hypertension   3. Other hyperlipidemia   4. Tobacco abuse      PLAN:  In order of problems listed above:   Chest pain when off meds-restarted in hospital. EKG unchanged  HTN  HLD  Tobacco abuse  Shared Decision Making/Informed Consent   {Are you ordering a CV Procedure (e.g. stress test, cath, DCCV, TEE, etc)?  Press F2        :[id number]}    Medication Adjustments/Labs and Tests Ordered: Current medicines are reviewed at length with the patient today.  Concerns regarding medicines are outlined above.  Medication changes, Labs and Tests ordered today are listed in the Patient Instructions below. There are no Patient Instructions on file for this visit.   Signed, Jacolyn Reedy, PA-C  06/05/2020 8:22 AM    [facility] [address] [place], [place], [place]  [address] Phone: [phone]; Fax: [phone]

## 2020-06-12 ENCOUNTER — Ambulatory Visit: Payer: Medicaid Other | Admitting: Physician Assistant

## 2020-06-12 DIAGNOSIS — E7849 Other hyperlipidemia: Secondary | ICD-10-CM

## 2020-06-12 DIAGNOSIS — I10 Essential (primary) hypertension: Secondary | ICD-10-CM

## 2020-06-12 DIAGNOSIS — Z720 Tobacco use: Secondary | ICD-10-CM

## 2020-06-12 DIAGNOSIS — R079 Chest pain, unspecified: Secondary | ICD-10-CM

## 2020-06-27 ENCOUNTER — Other Ambulatory Visit: Payer: Self-pay

## 2020-07-21 ENCOUNTER — Observation Stay (HOSPITAL_COMMUNITY)
Admission: EM | Admit: 2020-07-21 | Discharge: 2020-07-22 | DRG: 083 | Disposition: A | Discharge status: Home / Self Care | Payer: Medicaid Other | Attending: General Surgery | Admitting: General Surgery

## 2020-07-21 ENCOUNTER — Emergency Department (HOSPITAL_COMMUNITY): Payer: Medicaid Other

## 2020-07-21 ENCOUNTER — Encounter (HOSPITAL_COMMUNITY): Payer: Self-pay

## 2020-07-21 ENCOUNTER — Observation Stay (HOSPITAL_COMMUNITY): Payer: Medicaid Other

## 2020-07-21 DIAGNOSIS — Z79899 Other long term (current) drug therapy: Secondary | ICD-10-CM

## 2020-07-21 DIAGNOSIS — S42114A Nondisplaced fracture of body of scapula, right shoulder, initial encounter for closed fracture: Secondary | ICD-10-CM

## 2020-07-21 DIAGNOSIS — F17210 Nicotine dependence, cigarettes, uncomplicated: Secondary | ICD-10-CM | POA: Diagnosis present

## 2020-07-21 DIAGNOSIS — S42111A Displaced fracture of body of scapula, right shoulder, initial encounter for closed fracture: Secondary | ICD-10-CM | POA: Diagnosis not present

## 2020-07-21 DIAGNOSIS — S065X9A Traumatic subdural hemorrhage with loss of consciousness of unspecified duration, initial encounter: Principal | ICD-10-CM | POA: Diagnosis present

## 2020-07-21 DIAGNOSIS — Z20822 Contact with and (suspected) exposure to covid-19: Secondary | ICD-10-CM | POA: Diagnosis present

## 2020-07-21 DIAGNOSIS — Z7982 Long term (current) use of aspirin: Secondary | ICD-10-CM

## 2020-07-21 DIAGNOSIS — S022XXA Fracture of nasal bones, initial encounter for closed fracture: Secondary | ICD-10-CM | POA: Diagnosis present

## 2020-07-21 DIAGNOSIS — S199XXA Unspecified injury of neck, initial encounter: Secondary | ICD-10-CM | POA: Diagnosis not present

## 2020-07-21 DIAGNOSIS — I252 Old myocardial infarction: Secondary | ICD-10-CM

## 2020-07-21 DIAGNOSIS — Z8673 Personal history of transient ischemic attack (TIA), and cerebral infarction without residual deficits: Secondary | ICD-10-CM

## 2020-07-21 DIAGNOSIS — J439 Emphysema, unspecified: Secondary | ICD-10-CM | POA: Diagnosis not present

## 2020-07-21 DIAGNOSIS — I2510 Atherosclerotic heart disease of native coronary artery without angina pectoris: Secondary | ICD-10-CM | POA: Diagnosis not present

## 2020-07-21 DIAGNOSIS — S2249XA Multiple fractures of ribs, unspecified side, initial encounter for closed fracture: Secondary | ICD-10-CM

## 2020-07-21 DIAGNOSIS — S2241XA Multiple fractures of ribs, right side, initial encounter for closed fracture: Secondary | ICD-10-CM | POA: Diagnosis present

## 2020-07-21 DIAGNOSIS — R402362 Coma scale, best motor response, obeys commands, at arrival to emergency department: Secondary | ICD-10-CM | POA: Diagnosis not present

## 2020-07-21 DIAGNOSIS — R402252 Coma scale, best verbal response, oriented, at arrival to emergency department: Secondary | ICD-10-CM | POA: Diagnosis present

## 2020-07-21 DIAGNOSIS — T1490XA Injury, unspecified, initial encounter: Secondary | ICD-10-CM

## 2020-07-21 DIAGNOSIS — I700 Atherosclerosis of aorta: Secondary | ICD-10-CM | POA: Diagnosis not present

## 2020-07-21 DIAGNOSIS — Z7902 Long term (current) use of antithrombotics/antiplatelets: Secondary | ICD-10-CM

## 2020-07-21 DIAGNOSIS — Y92410 Unspecified street and highway as the place of occurrence of the external cause: Secondary | ICD-10-CM

## 2020-07-21 DIAGNOSIS — R402142 Coma scale, eyes open, spontaneous, at arrival to emergency department: Secondary | ICD-10-CM | POA: Diagnosis present

## 2020-07-21 DIAGNOSIS — S065XAA Traumatic subdural hemorrhage with loss of consciousness status unknown, initial encounter: Secondary | ICD-10-CM

## 2020-07-21 DIAGNOSIS — M79641 Pain in right hand: Secondary | ICD-10-CM | POA: Diagnosis not present

## 2020-07-21 DIAGNOSIS — I10 Essential (primary) hypertension: Secondary | ICD-10-CM | POA: Diagnosis not present

## 2020-07-21 DIAGNOSIS — Z041 Encounter for examination and observation following transport accident: Secondary | ICD-10-CM | POA: Diagnosis not present

## 2020-07-21 DIAGNOSIS — T148XXA Other injury of unspecified body region, initial encounter: Secondary | ICD-10-CM | POA: Diagnosis not present

## 2020-07-21 DIAGNOSIS — S065X0A Traumatic subdural hemorrhage without loss of consciousness, initial encounter: Secondary | ICD-10-CM | POA: Diagnosis not present

## 2020-07-21 DIAGNOSIS — S42101A Fracture of unspecified part of scapula, right shoulder, initial encounter for closed fracture: Secondary | ICD-10-CM | POA: Diagnosis not present

## 2020-07-21 DIAGNOSIS — S59911A Unspecified injury of right forearm, initial encounter: Secondary | ICD-10-CM | POA: Diagnosis not present

## 2020-07-21 DIAGNOSIS — M79601 Pain in right arm: Secondary | ICD-10-CM | POA: Diagnosis not present

## 2020-07-21 DIAGNOSIS — J432 Centrilobular emphysema: Secondary | ICD-10-CM | POA: Diagnosis not present

## 2020-07-21 DIAGNOSIS — N281 Cyst of kidney, acquired: Secondary | ICD-10-CM | POA: Diagnosis not present

## 2020-07-21 DIAGNOSIS — S3991XA Unspecified injury of abdomen, initial encounter: Secondary | ICD-10-CM | POA: Diagnosis not present

## 2020-07-21 DIAGNOSIS — S0990XA Unspecified injury of head, initial encounter: Secondary | ICD-10-CM | POA: Diagnosis not present

## 2020-07-21 HISTORY — DX: Heart failure, unspecified: I50.9

## 2020-07-21 HISTORY — DX: Cerebral infarction, unspecified: I63.9

## 2020-07-21 HISTORY — DX: Essential (primary) hypertension: I10

## 2020-07-21 HISTORY — DX: Acute myocardial infarction, unspecified: I21.9

## 2020-07-21 HISTORY — DX: Traumatic subdural hemorrhage with loss of consciousness status unknown, initial encounter: S06.5XAA

## 2020-07-21 LAB — SAMPLE TO BLOOD BANK

## 2020-07-21 LAB — CBC
HCT: 42.9 % (ref 39.0–52.0)
Hemoglobin: 14.9 g/dL (ref 13.0–17.0)
MCH: 34.7 pg — ABNORMAL HIGH (ref 26.0–34.0)
MCHC: 34.7 g/dL (ref 30.0–36.0)
MCV: 99.8 fL (ref 80.0–100.0)
Platelets: 197 10*3/uL (ref 150–400)
RBC: 4.3 MIL/uL (ref 4.22–5.81)
RDW: 13 % (ref 11.5–15.5)
WBC: 8.6 10*3/uL (ref 4.0–10.5)
nRBC: 0 % (ref 0.0–0.2)

## 2020-07-21 LAB — URINALYSIS, ROUTINE W REFLEX MICROSCOPIC
Bilirubin Urine: NEGATIVE
Glucose, UA: NEGATIVE mg/dL
Ketones, ur: NEGATIVE mg/dL
Leukocytes,Ua: NEGATIVE
Nitrite: NEGATIVE
Protein, ur: NEGATIVE mg/dL
Specific Gravity, Urine: 1.025 (ref 1.005–1.030)
pH: 5 (ref 5.0–8.0)

## 2020-07-21 LAB — I-STAT CHEM 8, ED
BUN: 15 mg/dL (ref 8–23)
Calcium, Ion: 1.08 mmol/L — ABNORMAL LOW (ref 1.15–1.40)
Chloride: 104 mmol/L (ref 98–111)
Creatinine, Ser: 0.9 mg/dL (ref 0.61–1.24)
Glucose, Bld: 92 mg/dL (ref 70–99)
HCT: 42 % (ref 39.0–52.0)
Hemoglobin: 14.3 g/dL (ref 13.0–17.0)
Potassium: 3.7 mmol/L (ref 3.5–5.1)
Sodium: 140 mmol/L (ref 135–145)
TCO2: 24 mmol/L (ref 22–32)

## 2020-07-21 LAB — ETHANOL: Alcohol, Ethyl (B): 175 mg/dL — ABNORMAL HIGH (ref <10)

## 2020-07-21 LAB — RESP PANEL BY RT-PCR (FLU A&B, COVID) ARPGX2
Influenza A by PCR: NEGATIVE
Influenza B by PCR: NEGATIVE
SARS Coronavirus 2 by RT PCR: NEGATIVE

## 2020-07-21 LAB — COMPREHENSIVE METABOLIC PANEL
ALT: 25 U/L (ref 0–44)
AST: 39 U/L (ref 15–41)
Albumin: 3.2 g/dL — ABNORMAL LOW (ref 3.5–5.0)
Alkaline Phosphatase: 76 U/L (ref 38–126)
Anion gap: 6 (ref 5–15)
BUN: 12 mg/dL (ref 8–23)
CO2: 26 mmol/L (ref 22–32)
Calcium: 7.9 mg/dL — ABNORMAL LOW (ref 8.9–10.3)
Chloride: 108 mmol/L (ref 98–111)
Creatinine, Ser: 0.7 mg/dL (ref 0.61–1.24)
GFR, Estimated: >60 mL/min (ref >60)
Glucose, Bld: 93 mg/dL (ref 70–99)
Potassium: 3.4 mmol/L — ABNORMAL LOW (ref 3.5–5.1)
Sodium: 140 mmol/L (ref 135–145)
Total Bilirubin: 0.4 mg/dL (ref 0.3–1.2)
Total Protein: 5.5 g/dL — ABNORMAL LOW (ref 6.5–8.1)

## 2020-07-21 LAB — PROTIME-INR
INR: 0.8 (ref 0.8–1.2)
Prothrombin Time: 11.4 seconds (ref 11.4–15.2)

## 2020-07-21 LAB — LACTIC ACID, PLASMA: Lactic Acid, Venous: 1.4 mmol/L (ref 0.5–1.9)

## 2020-07-21 LAB — TYPE AND SCREEN
ABO/RH(D): O POS
Antibody Screen: NEGATIVE

## 2020-07-21 LAB — LIPASE, BLOOD: Lipase: 30 U/L (ref 11–51)

## 2020-07-21 MED ORDER — ACETAMINOPHEN 325 MG PO TABS
650.0000 mg | ORAL_TABLET | Freq: Four times a day (QID) | ORAL | Status: DC
  Administered 2020-07-21 – 2020-07-22 (×5): 650 mg via ORAL
  Filled 2020-07-21 (×5): qty 2

## 2020-07-21 MED ORDER — MORPHINE SULFATE (PF) 4 MG/ML IV SOLN
4.0000 mg | Freq: Once | INTRAVENOUS | Status: AC
  Administered 2020-07-21: 4 mg via INTRAVENOUS
  Filled 2020-07-21: qty 1

## 2020-07-21 MED ORDER — METHOCARBAMOL 500 MG PO TABS
500.0000 mg | ORAL_TABLET | Freq: Four times a day (QID) | ORAL | Status: DC | PRN
  Administered 2020-07-21: 500 mg via ORAL
  Filled 2020-07-21: qty 1

## 2020-07-21 MED ORDER — ONDANSETRON 4 MG PO TBDP: 4.0000 mg | ORAL_TABLET | Freq: Four times a day (QID) | ORAL | Status: DC | PRN

## 2020-07-21 MED ORDER — BISACODYL 10 MG RE SUPP: 10.0000 mg | Freq: Every day | RECTAL | Status: DC | PRN

## 2020-07-21 MED ORDER — MORPHINE SULFATE (PF) 4 MG/ML IV SOLN
4.0000 mg | Freq: Once | INTRAVENOUS | Status: AC
Start: 2020-07-21 — End: 2020-07-21
  Administered 2020-07-21: 4 mg via INTRAVENOUS
  Filled 2020-07-21: qty 1

## 2020-07-21 MED ORDER — IBUPROFEN 200 MG PO TABS
800.0000 mg | ORAL_TABLET | Freq: Three times a day (TID) | ORAL | Status: DC
  Administered 2020-07-21 – 2020-07-22 (×4): 800 mg via ORAL
  Filled 2020-07-21 (×2): qty 4
  Filled 2020-07-21: qty 1
  Filled 2020-07-21: qty 4

## 2020-07-21 MED ORDER — SODIUM CHLORIDE 0.9 % IV SOLN: INTRAVENOUS | Status: DC

## 2020-07-21 MED ORDER — GABAPENTIN 300 MG PO CAPS
300.0000 mg | ORAL_CAPSULE | Freq: Three times a day (TID) | ORAL | Status: DC
  Administered 2020-07-21 – 2020-07-22 (×4): 300 mg via ORAL
  Filled 2020-07-21 (×4): qty 1

## 2020-07-21 MED ORDER — ONDANSETRON HCL 4 MG/2ML IJ SOLN: 4.0000 mg | Freq: Four times a day (QID) | INTRAMUSCULAR | Status: DC | PRN

## 2020-07-21 MED ORDER — DOCUSATE SODIUM 100 MG PO CAPS
100.0000 mg | ORAL_CAPSULE | Freq: Two times a day (BID) | ORAL | Status: DC
  Administered 2020-07-21 – 2020-07-22 (×2): 100 mg via ORAL
  Filled 2020-07-21 (×2): qty 1

## 2020-07-21 MED ORDER — METOPROLOL TARTRATE 5 MG/5ML IV SOLN
5.0000 mg | Freq: Four times a day (QID) | INTRAVENOUS | Status: DC | PRN
Start: 2020-07-21 — End: 2020-07-23

## 2020-07-21 MED ORDER — HYDRALAZINE HCL 20 MG/ML IJ SOLN
10.0000 mg | INTRAMUSCULAR | Status: DC | PRN
Start: 2020-07-21 — End: 2020-07-23

## 2020-07-21 MED ORDER — HYDROMORPHONE HCL 1 MG/ML IJ SOLN
0.5000 mg | INTRAMUSCULAR | Status: DC | PRN
  Administered 2020-07-21 – 2020-07-22 (×5): 0.5 mg via INTRAVENOUS
  Filled 2020-07-21 (×3): qty 1
  Filled 2020-07-21: qty 0.5
  Filled 2020-07-21: qty 1

## 2020-07-21 MED ORDER — LEVETIRACETAM IN NACL 500 MG/100ML IV SOLN
500.0000 mg | Freq: Two times a day (BID) | INTRAVENOUS | Status: DC
  Administered 2020-07-21 – 2020-07-22 (×2): 500 mg via INTRAVENOUS
  Filled 2020-07-21 (×3): qty 100

## 2020-07-21 MED ORDER — IOHEXOL 300 MG/ML  SOLN
75.0000 mL | Freq: Once | INTRAMUSCULAR | Status: AC | PRN
  Administered 2020-07-21: 75 mL via INTRAVENOUS

## 2020-07-21 MED ORDER — HYDROMORPHONE HCL 1 MG/ML IJ SOLN
1.0000 mg | Freq: Once | INTRAMUSCULAR | Status: AC
Start: 2020-07-21 — End: 2020-07-21
  Administered 2020-07-21: 1 mg via INTRAVENOUS
  Filled 2020-07-21: qty 1

## 2020-07-21 MED ORDER — OXYCODONE HCL 5 MG PO TABS
5.0000 mg | ORAL_TABLET | ORAL | Status: DC | PRN
  Administered 2020-07-21 – 2020-07-22 (×4): 5 mg via ORAL
  Filled 2020-07-21 (×4): qty 1

## 2020-07-21 MED ORDER — SODIUM CHLORIDE 0.9% IV SOLUTION: Freq: Once | INTRAVENOUS | Status: AC

## 2020-07-21 MED ORDER — FENTANYL CITRATE (PF) 100 MCG/2ML IJ SOLN
50.0000 ug | Freq: Once | INTRAMUSCULAR | Status: AC
  Administered 2020-07-21: 50 ug via INTRAVENOUS
  Filled 2020-07-21: qty 2

## 2020-07-21 MED ORDER — CHLORHEXIDINE GLUCONATE CLOTH 2 % EX PADS
6.0000 | MEDICATED_PAD | Freq: Every day | CUTANEOUS | Status: DC
  Administered 2020-07-22: 6 via TOPICAL
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
## 2020-07-21 NOTE — ED Notes (Signed)
Patient transported to CT 
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
## 2020-07-21 NOTE — Progress Notes (Signed)
Patient ID: Connor Baker, male   DOB: 02/05/1961, 60 y.o.   MRN: [id number]  Patient's follow up CT head revealed enlargement of the falcine SDH and is now extending along the entirety of the anterior and posterior falx. SDH measures up to 9 mm in thickness, without mass effect. There is a new small right frontal subdural hematoma measuring up to 4 mm in thickness with no mass effect. There is no neurosurgical intervention to offer this patient at this time. Would recommend reversing antiplatelet with platelets or single dose of DDAVP and do follow up CT head in the morning.

## 2020-07-21 NOTE — Progress Notes (Signed)
I was contacted regarding patient's right scapular fracture and have reviewed CT images. There is in fact a nondisplaced right scapular fracture, which is expected to heal uneventfully without surgery. I recommend continuing immobilization of patient's right upper extremity in a sling with outpatient follow-up with Dr. Ave Filter, our shoulder specialist.
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
## 2020-07-22 ENCOUNTER — Observation Stay (HOSPITAL_COMMUNITY): Payer: Medicaid Other

## 2020-07-22 DIAGNOSIS — S022XXA Fracture of nasal bones, initial encounter for closed fracture: Secondary | ICD-10-CM | POA: Diagnosis present

## 2020-07-22 DIAGNOSIS — Z7902 Long term (current) use of antithrombotics/antiplatelets: Secondary | ICD-10-CM | POA: Diagnosis not present

## 2020-07-22 DIAGNOSIS — Z20822 Contact with and (suspected) exposure to covid-19: Secondary | ICD-10-CM | POA: Diagnosis present

## 2020-07-22 DIAGNOSIS — I10 Essential (primary) hypertension: Secondary | ICD-10-CM | POA: Diagnosis present

## 2020-07-22 DIAGNOSIS — Z8673 Personal history of transient ischemic attack (TIA), and cerebral infarction without residual deficits: Secondary | ICD-10-CM | POA: Diagnosis not present

## 2020-07-22 DIAGNOSIS — R402362 Coma scale, best motor response, obeys commands, at arrival to emergency department: Secondary | ICD-10-CM | POA: Diagnosis present

## 2020-07-22 DIAGNOSIS — R402252 Coma scale, best verbal response, oriented, at arrival to emergency department: Secondary | ICD-10-CM | POA: Diagnosis present

## 2020-07-22 DIAGNOSIS — I252 Old myocardial infarction: Secondary | ICD-10-CM | POA: Diagnosis not present

## 2020-07-22 DIAGNOSIS — Z79899 Other long term (current) drug therapy: Secondary | ICD-10-CM | POA: Diagnosis not present

## 2020-07-22 DIAGNOSIS — S42101A Fracture of unspecified part of scapula, right shoulder, initial encounter for closed fracture: Secondary | ICD-10-CM | POA: Diagnosis not present

## 2020-07-22 DIAGNOSIS — S2241XA Multiple fractures of ribs, right side, initial encounter for closed fracture: Secondary | ICD-10-CM | POA: Diagnosis not present

## 2020-07-22 DIAGNOSIS — Z7982 Long term (current) use of aspirin: Secondary | ICD-10-CM | POA: Diagnosis not present

## 2020-07-22 DIAGNOSIS — J439 Emphysema, unspecified: Secondary | ICD-10-CM | POA: Diagnosis present

## 2020-07-22 DIAGNOSIS — Y92410 Unspecified street and highway as the place of occurrence of the external cause: Secondary | ICD-10-CM | POA: Diagnosis not present

## 2020-07-22 DIAGNOSIS — F17210 Nicotine dependence, cigarettes, uncomplicated: Secondary | ICD-10-CM | POA: Diagnosis present

## 2020-07-22 DIAGNOSIS — S065X9A Traumatic subdural hemorrhage with loss of consciousness of unspecified duration, initial encounter: Secondary | ICD-10-CM | POA: Diagnosis not present

## 2020-07-22 DIAGNOSIS — S2249XA Multiple fractures of ribs, unspecified side, initial encounter for closed fracture: Secondary | ICD-10-CM | POA: Diagnosis present

## 2020-07-22 DIAGNOSIS — I2510 Atherosclerotic heart disease of native coronary artery without angina pectoris: Secondary | ICD-10-CM | POA: Diagnosis present

## 2020-07-22 DIAGNOSIS — R402142 Coma scale, eyes open, spontaneous, at arrival to emergency department: Secondary | ICD-10-CM | POA: Diagnosis present

## 2020-07-22 DIAGNOSIS — S065X0A Traumatic subdural hemorrhage without loss of consciousness, initial encounter: Secondary | ICD-10-CM | POA: Diagnosis not present

## 2020-07-22 DIAGNOSIS — S42111A Displaced fracture of body of scapula, right shoulder, initial encounter for closed fracture: Secondary | ICD-10-CM | POA: Diagnosis present

## 2020-07-22 LAB — BASIC METABOLIC PANEL
Anion gap: 4 — ABNORMAL LOW (ref 5–15)
BUN: 10 mg/dL (ref 8–23)
CO2: 27 mmol/L (ref 22–32)
Calcium: 8.2 mg/dL — ABNORMAL LOW (ref 8.9–10.3)
Chloride: 105 mmol/L (ref 98–111)
Creatinine, Ser: 0.73 mg/dL (ref 0.61–1.24)
GFR, Estimated: >60 mL/min (ref >60)
Glucose, Bld: 84 mg/dL (ref 70–99)
Potassium: 3.7 mmol/L (ref 3.5–5.1)
Sodium: 136 mmol/L (ref 135–145)

## 2020-07-22 LAB — CBC
HCT: 39.9 % (ref 39.0–52.0)
Hemoglobin: 13.7 g/dL (ref 13.0–17.0)
MCH: 34.3 pg — ABNORMAL HIGH (ref 26.0–34.0)
MCHC: 34.3 g/dL (ref 30.0–36.0)
MCV: 100 fL (ref 80.0–100.0)
Platelets: 173 10*3/uL (ref 150–400)
RBC: 3.99 MIL/uL — ABNORMAL LOW (ref 4.22–5.81)
RDW: 13.1 % (ref 11.5–15.5)
WBC: 10.8 10*3/uL — ABNORMAL HIGH (ref 4.0–10.5)
nRBC: 0 % (ref 0.0–0.2)

## 2020-07-22 LAB — MRSA PCR SCREENING: MRSA by PCR: NEGATIVE

## 2020-07-22 LAB — HIV ANTIBODY (ROUTINE TESTING W REFLEX): HIV Screen 4th Generation wRfx: NONREACTIVE

## 2020-07-22 LAB — ABO/RH: ABO/RH(D): O POS

## 2020-07-22 MED ORDER — LEVETIRACETAM 500 MG PO TABS: 500.0000 mg | ORAL_TABLET | Freq: Two times a day (BID) | ORAL | 0 refills | Status: DC

## 2020-07-22 MED ORDER — LEVETIRACETAM 500 MG PO TABS: 500.0000 mg | ORAL_TABLET | Freq: Two times a day (BID) | ORAL | Status: DC

## 2020-07-22 MED ORDER — OXYCODONE HCL 5 MG PO TABS: 5.0000 mg | ORAL_TABLET | Freq: Four times a day (QID) | ORAL | 0 refills | Status: DC | PRN

## 2020-07-22 NOTE — Progress Notes (Addendum)
Patient stated that he is going to walk home. Patient was offered and encouraged to wait for RN to arrange for a Taxi to take patient home  but Patient refused and left.
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
## 2020-07-22 NOTE — Progress Notes (Signed)
Patient transported to [facility] without any complications. VSS.
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
## 2020-07-22 NOTE — Progress Notes (Signed)
Patient is discharge home. Patient is alert and stable.

## 2020-07-22 NOTE — Progress Notes (Signed)
Platelet infusion order put in around 0030. Tried to reach phlebotomy multiple times for Typing screen to proceed with infusion. Still waiting for labs to be completed. RN monitoring.  Benson Norway, RN

## 2020-07-23 ENCOUNTER — Telehealth: Payer: Self-pay

## 2020-07-23 ENCOUNTER — Other Ambulatory Visit: Payer: Self-pay | Admitting: Surgery

## 2020-07-23 DIAGNOSIS — I25110 Atherosclerotic heart disease of native coronary artery with unstable angina pectoris: Secondary | ICD-10-CM

## 2020-07-23 LAB — BPAM PLATELET PHERESIS
Blood Product Expiration Date: 202205312359
ISSUE DATE / TIME: 202205290427
Unit Type and Rh: 6200

## 2020-07-23 LAB — PREPARE PLATELET PHERESIS: Unit division: 0

## 2020-07-23 MED ORDER — LEVETIRACETAM 500 MG PO TABS: 500.0000 mg | ORAL_TABLET | Freq: Two times a day (BID) | ORAL | 0 refills | Status: DC

## 2020-07-23 MED ORDER — OXYCODONE HCL 5 MG PO TABS: 5.0000 mg | ORAL_TABLET | Freq: Three times a day (TID) | ORAL | 0 refills | Status: AC | PRN

## 2020-07-23 NOTE — Progress Notes (Signed)
Patient was discharged late yesterday evening. Today his pharmacy is closed. Prescriptions sent to different [facility].

## 2020-07-23 NOTE — Telephone Encounter (Signed)
Transition Care Management Unsuccessful Follow-up Telephone Call  Date of discharge and from where:  07/23/2020 from [facility]  Attempts:  1st Attempt  Reason for unsuccessful TCM follow-up call:  Left voice message

## 2020-07-24 ENCOUNTER — Encounter (HOSPITAL_COMMUNITY): Payer: Self-pay | Admitting: Emergency Medicine
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
## 2020-08-28 ENCOUNTER — Other Ambulatory Visit (HOSPITAL_COMMUNITY): Payer: Self-pay

## 2020-09-21 ENCOUNTER — Ambulatory Visit (HOSPITAL_COMMUNITY)
Admission: EM | Admit: 2020-09-21 | Discharge: 2020-09-21 | Disposition: A | Discharge status: Home / Self Care | Payer: Medicaid Other | Attending: Student | Admitting: Student

## 2020-09-21 ENCOUNTER — Encounter (HOSPITAL_COMMUNITY): Payer: Self-pay | Admitting: Emergency Medicine

## 2020-09-21 ENCOUNTER — Other Ambulatory Visit: Payer: Self-pay

## 2020-09-21 DIAGNOSIS — M7541 Impingement syndrome of right shoulder: Secondary | ICD-10-CM | POA: Diagnosis not present

## 2020-09-21 DIAGNOSIS — S42101S Fracture of unspecified part of scapula, right shoulder, sequela: Secondary | ICD-10-CM | POA: Diagnosis not present

## 2020-09-21 MED ORDER — TIZANIDINE HCL 2 MG PO TABS: 2.0000 mg | ORAL_TABLET | Freq: Three times a day (TID) | ORAL | 0 refills | Status: DC | PRN

## 2020-09-21 MED ORDER — METHYLPREDNISOLONE SODIUM SUCC 125 MG IJ SOLR
80.0000 mg | Freq: Once | INTRAMUSCULAR | Status: AC
  Administered 2020-09-21: 80 mg via INTRAMUSCULAR

## 2020-09-21 MED ORDER — METHYLPREDNISOLONE ACETATE 80 MG/ML IJ SUSP
INTRAMUSCULAR | Status: AC
  Filled 2020-09-21: qty 1

## 2020-09-21 MED ORDER — METHYLPREDNISOLONE SODIUM SUCC 125 MG IJ SOLR
INTRAMUSCULAR | Status: AC
  Filled 2020-09-21: qty 2
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
## 2020-09-21 NOTE — Discharge Instructions (Addendum)
-  Start the muscle relaxer-Zanaflex (tizanidine), up to 3 times daily for muscle spasms and pain.  This can make you drowsy, so take at bedtime or when you do not need to drive or operate machinery. -Continue: Take Tylenol 1000 mg 3 times daily, and ibuprofen 800 mg 3 times daily with food.  You can take these together, or alternate every 3-4 hours. -You can alternate heat and ice -Follow-up with orthopedist:  -Follow-up with an orthopedist. I recommend [facility] at [address] [place], [place] [address]. You can schedule an appointment by calling [phone]) or online ([email or web address]), but they also have a walk-in clinic M-F 8a-8p and Sat 10a-3p.

## 2020-11-07 ENCOUNTER — Emergency Department (HOSPITAL_COMMUNITY)
Admission: EM | Admit: 2020-11-07 | Discharge: 2020-11-08 | Disposition: A | Discharge status: Home / Self Care | Attending: Emergency Medicine | Admitting: Emergency Medicine

## 2020-11-07 ENCOUNTER — Other Ambulatory Visit: Payer: Self-pay

## 2020-11-07 ENCOUNTER — Encounter (HOSPITAL_COMMUNITY): Payer: Self-pay | Admitting: Emergency Medicine

## 2020-11-07 ENCOUNTER — Emergency Department (HOSPITAL_COMMUNITY)

## 2020-11-07 DIAGNOSIS — I110 Hypertensive heart disease with heart failure: Secondary | ICD-10-CM | POA: Diagnosis not present

## 2020-11-07 DIAGNOSIS — F17210 Nicotine dependence, cigarettes, uncomplicated: Secondary | ICD-10-CM | POA: Insufficient documentation

## 2020-11-07 DIAGNOSIS — I25119 Atherosclerotic heart disease of native coronary artery with unspecified angina pectoris: Secondary | ICD-10-CM | POA: Insufficient documentation

## 2020-11-07 DIAGNOSIS — Z7902 Long term (current) use of antithrombotics/antiplatelets: Secondary | ICD-10-CM | POA: Insufficient documentation

## 2020-11-07 DIAGNOSIS — U071 COVID-19: Secondary | ICD-10-CM | POA: Insufficient documentation

## 2020-11-07 DIAGNOSIS — R0789 Other chest pain: Secondary | ICD-10-CM

## 2020-11-07 DIAGNOSIS — Z7982 Long term (current) use of aspirin: Secondary | ICD-10-CM | POA: Insufficient documentation

## 2020-11-07 DIAGNOSIS — I25110 Atherosclerotic heart disease of native coronary artery with unstable angina pectoris: Secondary | ICD-10-CM

## 2020-11-07 DIAGNOSIS — R059 Cough, unspecified: Secondary | ICD-10-CM | POA: Diagnosis not present

## 2020-11-07 DIAGNOSIS — I509 Heart failure, unspecified: Secondary | ICD-10-CM | POA: Insufficient documentation

## 2020-11-07 DIAGNOSIS — Z96641 Presence of right artificial hip joint: Secondary | ICD-10-CM | POA: Diagnosis not present

## 2020-11-07 DIAGNOSIS — I200 Unstable angina: Secondary | ICD-10-CM | POA: Diagnosis not present

## 2020-11-07 DIAGNOSIS — I2510 Atherosclerotic heart disease of native coronary artery without angina pectoris: Secondary | ICD-10-CM | POA: Diagnosis not present

## 2020-11-07 DIAGNOSIS — R079 Chest pain, unspecified: Secondary | ICD-10-CM | POA: Diagnosis not present

## 2020-11-07 DIAGNOSIS — M25511 Pain in right shoulder: Secondary | ICD-10-CM | POA: Diagnosis not present

## 2020-11-07 DIAGNOSIS — R0902 Hypoxemia: Secondary | ICD-10-CM | POA: Diagnosis not present

## 2020-11-07 LAB — CBC WITH DIFFERENTIAL/PLATELET
Abs Immature Granulocytes: 0.03 10*3/uL (ref 0.00–0.07)
Basophils Absolute: 0 10*3/uL (ref 0.0–0.1)
Basophils Relative: 1 %
Eosinophils Absolute: 0.1 10*3/uL (ref 0.0–0.5)
Eosinophils Relative: 1 %
HCT: 43.9 % (ref 39.0–52.0)
Hemoglobin: 15.7 g/dL (ref 13.0–17.0)
Immature Granulocytes: 0 %
Lymphocytes Relative: 32 %
Lymphs Abs: 2.8 10*3/uL (ref 0.7–4.0)
MCH: 35.4 pg — ABNORMAL HIGH (ref 26.0–34.0)
MCHC: 35.8 g/dL (ref 30.0–36.0)
MCV: 98.9 fL (ref 80.0–100.0)
Monocytes Absolute: 0.9 10*3/uL (ref 0.1–1.0)
Monocytes Relative: 11 %
Neutro Abs: 4.7 10*3/uL (ref 1.7–7.7)
Neutrophils Relative %: 55 %
Platelets: 185 10*3/uL (ref 150–400)
RBC: 4.44 MIL/uL (ref 4.22–5.81)
RDW: 13 % (ref 11.5–15.5)
WBC: 8.5 10*3/uL (ref 4.0–10.5)
nRBC: 0 % (ref 0.0–0.2)

## 2020-11-07 LAB — COMPREHENSIVE METABOLIC PANEL
ALT: 12 U/L (ref 0–44)
AST: 22 U/L (ref 15–41)
Albumin: 3.4 g/dL — ABNORMAL LOW (ref 3.5–5.0)
Alkaline Phosphatase: 79 U/L (ref 38–126)
Anion gap: 7 (ref 5–15)
BUN: 9 mg/dL (ref 8–23)
CO2: 27 mmol/L (ref 22–32)
Calcium: 8.9 mg/dL (ref 8.9–10.3)
Chloride: 100 mmol/L (ref 98–111)
Creatinine, Ser: 0.73 mg/dL (ref 0.61–1.24)
GFR, Estimated: >60 mL/min (ref >60)
Glucose, Bld: 103 mg/dL — ABNORMAL HIGH (ref 70–99)
Potassium: 4 mmol/L (ref 3.5–5.1)
Sodium: 134 mmol/L — ABNORMAL LOW (ref 135–145)
Total Bilirubin: 1.1 mg/dL (ref 0.3–1.2)
Total Protein: 5.9 g/dL — ABNORMAL LOW (ref 6.5–8.1)

## 2020-11-07 LAB — LIPASE, BLOOD: Lipase: 31 U/L (ref 11–51)

## 2020-11-07 LAB — TROPONIN I (HIGH SENSITIVITY): Troponin I (High Sensitivity): 7 ng/L (ref <18)
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
## 2020-11-07 NOTE — ED Provider Notes (Signed)
Emergency Medicine Provider Triage Evaluation Note  Connor Baker , a 60 y.o. male  was evaluated in triage.  Pt complains of chest pain.  Patient reports that chest pain is midsternal and radiates to right shoulder.  Patient describes pain as a pressure.  Rates pain 8/10 on the pain scale.  No associated nausea, vomiting, diaphoresis, or shortness of breath.  Patient received 3 to 4 mg of aspirin and sublingual nitro x3 prior to arrival.  Minimal improvement in pain with nitroglycerin.  Patient tested positive for COVID-19 3 to 4 days prior.  Patient has been having symptoms for the last week.  Symptoms include nausea, vomiting, diarrhea, and fatigue.    Review of Systems  Positive: Chest pain Negative: Nausea, vomiting, diaphoresis, shortness of breath  Physical Exam  BP 113/88 (BP Location: Left Arm)   Pulse (!) 58   Temp 98.1 F (36.7 C) (Oral)   Resp 16   SpO2 99%  Gen:   Awake, no distress   Resp:  Normal effort  MSK:   Moves extremities without difficulty  Other:  +2 radial pulse bilaterally.  Abdomen soft, nondistended, nontender, no mass, no pulsatile mass, no peritoneal signs.  Medical Decision Making  Medically screening exam initiated at 9:35 PM.  Appropriate orders placed.  Niguel D Pottle was informed that the remainder of the evaluation will be completed by another provider, this initial triage assessment does not replace that evaluation, and the importance of remaining in the ED until their evaluation is complete.  The patient appears stable so that the remainder of the work up may be completed by another provider.      Berneice Heinrich 11/07/20 2149    Gerhard Munch, MD 11/07/20 2246

## 2020-11-08 LAB — TROPONIN I (HIGH SENSITIVITY): Troponin I (High Sensitivity): 6 ng/L (ref <18)

## 2020-11-08 MED ORDER — BENZONATATE 100 MG PO CAPS: 100.0000 mg | ORAL_CAPSULE | Freq: Three times a day (TID) | ORAL | 0 refills | Status: DC

## 2020-11-08 MED ORDER — OXYCODONE-ACETAMINOPHEN 5-325 MG PO TABS
1.0000 | ORAL_TABLET | Freq: Once | ORAL | Status: AC
Start: 2020-11-08 — End: 2020-11-08
  Administered 2020-11-08: 1 via ORAL
  Filled 2020-11-08: qty 1
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
## 2020-11-09 ENCOUNTER — Telehealth: Payer: Self-pay

## 2020-11-09 NOTE — Telephone Encounter (Signed)
Transition Care Management Unsuccessful Follow-up Telephone Call  Date of discharge and from where:  11/08/2020-[facility]  Attempts:  1st Attempt  Reason for unsuccessful TCM follow-up call:  Left voice message

## 2020-11-10 NOTE — Telephone Encounter (Signed)
Transition Care Management Unsuccessful Follow-up Telephone Call  Date of discharge and from where:  11/08/2020 from [facility]  Attempts:  2nd Attempt  Reason for unsuccessful TCM follow-up call:  Left voice message    

## 2020-11-11 NOTE — Telephone Encounter (Signed)
Transition Care Management Unsuccessful Follow-up Telephone Call  Date of discharge and from where:  11/08/2020 from [facility]  Attempts:  3rd Attempt  Reason for unsuccessful TCM follow-up call:  Unable to reach patient

## 2020-11-16 ENCOUNTER — Encounter (HOSPITAL_COMMUNITY): Payer: Self-pay

## 2020-11-16 ENCOUNTER — Emergency Department (HOSPITAL_COMMUNITY)

## 2020-11-16 ENCOUNTER — Other Ambulatory Visit: Payer: Self-pay

## 2020-11-16 ENCOUNTER — Emergency Department (HOSPITAL_COMMUNITY)
Admission: EM | Admit: 2020-11-16 | Discharge: 2020-11-16 | Disposition: A | Discharge status: Home / Self Care | Attending: Emergency Medicine | Admitting: Emergency Medicine

## 2020-11-16 DIAGNOSIS — I2510 Atherosclerotic heart disease of native coronary artery without angina pectoris: Secondary | ICD-10-CM | POA: Diagnosis not present

## 2020-11-16 DIAGNOSIS — Z8616 Personal history of COVID-19: Secondary | ICD-10-CM | POA: Diagnosis not present

## 2020-11-16 DIAGNOSIS — I110 Hypertensive heart disease with heart failure: Secondary | ICD-10-CM | POA: Diagnosis not present

## 2020-11-16 DIAGNOSIS — I509 Heart failure, unspecified: Secondary | ICD-10-CM | POA: Insufficient documentation

## 2020-11-16 DIAGNOSIS — R0602 Shortness of breath: Secondary | ICD-10-CM | POA: Diagnosis not present

## 2020-11-16 DIAGNOSIS — R072 Precordial pain: Secondary | ICD-10-CM | POA: Insufficient documentation

## 2020-11-16 DIAGNOSIS — Z7982 Long term (current) use of aspirin: Secondary | ICD-10-CM | POA: Diagnosis not present

## 2020-11-16 DIAGNOSIS — R202 Paresthesia of skin: Secondary | ICD-10-CM | POA: Diagnosis not present

## 2020-11-16 DIAGNOSIS — R110 Nausea: Secondary | ICD-10-CM | POA: Insufficient documentation

## 2020-11-16 DIAGNOSIS — R079 Chest pain, unspecified: Secondary | ICD-10-CM | POA: Diagnosis not present

## 2020-11-16 DIAGNOSIS — Z7902 Long term (current) use of antithrombotics/antiplatelets: Secondary | ICD-10-CM | POA: Insufficient documentation

## 2020-11-16 DIAGNOSIS — R0789 Other chest pain: Secondary | ICD-10-CM | POA: Diagnosis not present

## 2020-11-16 DIAGNOSIS — F17210 Nicotine dependence, cigarettes, uncomplicated: Secondary | ICD-10-CM | POA: Insufficient documentation

## 2020-11-16 LAB — COMPREHENSIVE METABOLIC PANEL
ALT: 17 U/L (ref 0–44)
AST: 16 U/L (ref 15–41)
Albumin: 2.4 g/dL — ABNORMAL LOW (ref 3.5–5.0)
Alkaline Phosphatase: 48 U/L (ref 38–126)
Anion gap: 2 — ABNORMAL LOW (ref 5–15)
BUN: 5 mg/dL — ABNORMAL LOW (ref 8–23)
CO2: 21 mmol/L — ABNORMAL LOW (ref 22–32)
Calcium: 6.4 mg/dL — CL (ref 8.9–10.3)
Chloride: 117 mmol/L — ABNORMAL HIGH (ref 98–111)
Creatinine, Ser: 0.55 mg/dL — ABNORMAL LOW (ref 0.61–1.24)
GFR, Estimated: >60 mL/min (ref >60)
Glucose, Bld: 93 mg/dL (ref 70–99)
Potassium: 2.8 mmol/L — ABNORMAL LOW (ref 3.5–5.1)
Sodium: 140 mmol/L (ref 135–145)
Total Bilirubin: 0.6 mg/dL (ref 0.3–1.2)
Total Protein: 4.1 g/dL — ABNORMAL LOW (ref 6.5–8.1)

## 2020-11-16 LAB — CBC WITH DIFFERENTIAL/PLATELET
Abs Immature Granulocytes: 0.02 10*3/uL (ref 0.00–0.07)
Basophils Absolute: 0.1 10*3/uL (ref 0.0–0.1)
Basophils Relative: 1 %
Eosinophils Absolute: 0.1 10*3/uL (ref 0.0–0.5)
Eosinophils Relative: 1 %
HCT: 33 % — ABNORMAL LOW (ref 39.0–52.0)
Hemoglobin: 11.2 g/dL — ABNORMAL LOW (ref 13.0–17.0)
Immature Granulocytes: 0 %
Lymphocytes Relative: 31 %
Lymphs Abs: 1.8 10*3/uL (ref 0.7–4.0)
MCH: 34.7 pg — ABNORMAL HIGH (ref 26.0–34.0)
MCHC: 33.9 g/dL (ref 30.0–36.0)
MCV: 102.2 fL — ABNORMAL HIGH (ref 80.0–100.0)
Monocytes Absolute: 0.7 10*3/uL (ref 0.1–1.0)
Monocytes Relative: 13 %
Neutro Abs: 3.1 10*3/uL (ref 1.7–7.7)
Neutrophils Relative %: 54 %
Platelets: 124 10*3/uL — ABNORMAL LOW (ref 150–400)
RBC: 3.23 MIL/uL — ABNORMAL LOW (ref 4.22–5.81)
RDW: 12.4 % (ref 11.5–15.5)
WBC: 5.7 10*3/uL (ref 4.0–10.5)
nRBC: 0 % (ref 0.0–0.2)

## 2020-11-16 LAB — I-STAT CHEM 8, ED
BUN: 6 mg/dL — ABNORMAL LOW (ref 8–23)
Calcium, Ion: 1.21 mmol/L (ref 1.15–1.40)
Chloride: 102 mmol/L (ref 98–111)
Creatinine, Ser: 0.8 mg/dL (ref 0.61–1.24)
Glucose, Bld: 92 mg/dL (ref 70–99)
HCT: 38 % — ABNORMAL LOW (ref 39.0–52.0)
Hemoglobin: 12.9 g/dL — ABNORMAL LOW (ref 13.0–17.0)
Potassium: 3.8 mmol/L (ref 3.5–5.1)
Sodium: 142 mmol/L (ref 135–145)
TCO2: 27 mmol/L (ref 22–32)

## 2020-11-16 LAB — TROPONIN I (HIGH SENSITIVITY)
Troponin I (High Sensitivity): 6 ng/L (ref <18)
Troponin I (High Sensitivity): 8 ng/L (ref <18)

## 2020-11-16 MED ORDER — NITROGLYCERIN 0.4 MG SL SUBL
0.4000 mg | SUBLINGUAL_TABLET | SUBLINGUAL | Status: DC | PRN
  Administered 2020-11-16 (×3): 0.4 mg via SUBLINGUAL
  Filled 2020-11-16: qty 1

## 2020-11-16 MED ORDER — MORPHINE SULFATE (PF) 4 MG/ML IV SOLN
4.0000 mg | Freq: Once | INTRAVENOUS | Status: AC
Start: 2020-11-16 — End: 2020-11-16
  Administered 2020-11-16: 4 mg via INTRAVENOUS
  Filled 2020-11-16: qty 1

## 2020-11-16 MED ORDER — RANOLAZINE ER 500 MG PO TB12: 500.0000 mg | ORAL_TABLET | Freq: Two times a day (BID) | ORAL | 0 refills | Status: DC

## 2020-11-16 NOTE — ED Provider Notes (Signed)
[facility] EMERGENCY DEPARTMENT Provider Note   CSN: [id number] Arrival date & time: 11/16/20  1922     History Chief Complaint  Patient presents with   Chest Pain    Chest pain in the center of chest that radiates to the left arm started 2 hours ago. Hx of previous mi    Connor Baker is a 60 y.o. male with PMHx HTN, HLD, CAD, CVA, OSA who presents for evaluation of chest pain.  He is currently incarcerated, accompanied by law enforcement.  Of note, patient was recently diagnosed with COVID-19 several weeks ago.  He states his symptoms have resolved in the interim.   Today, he presents for evaluation of substernal chest pain.  He states that the pain is nonradiating.  He describes it as a heaviness, like someone sitting on his chest.  He reports paresthesia in his left arm and neck.  He reports associated shortness of breath and nausea without vomiting.  He denies any back pain, tearing quality, or weakness.  He denies pleurisy or lower extremity edema.  He states that his symptoms feel similar to prior heart attacks; however, he does note that aspirin and nitro did not help his pain this time.     Past Medical History:  Diagnosis Date   Arthritis    "legs" (08/28/2016)   CHF (congestive heart failure) (HCC)    Chronic lower back pain    Coronary artery disease    CVA (cerebral vascular accident) (HCC)    Depression    GERD (gastroesophageal reflux disease)    High cholesterol    Hypertension    MI (myocardial infarction) (HCC) 1992; 1994; 1995   MI (myocardial infarction) (HCC)    [phone]   On home oxygen therapy    "4L just at night w/my CPAP" (08/28/2016)   OSA (obstructive sleep apnea)    w/oxygen" (08/28/2016)   Stroke (HCC) 2005   "mild one; faded my memory" (08/28/2016)    Patient Active Problem List   Diagnosis Date Noted   Subdural hematoma (HCC) 07/21/2020   Chest pain 05/15/2020   Unstable angina (HCC) 12/31/2017   Tobacco abuse  10/11/2017   Hyperlipidemia 08/29/2016   CAD (coronary artery disease), native coronary artery 08/28/2016   Hypertension, essential     Past Surgical History:  Procedure Laterality Date   ANKLE FRACTURE SURGERY Right 2016   CORONARY ANGIOPLASTY     CORONARY ANGIOPLASTY WITH STENT PLACEMENT  1992 - 2017   "I've got a total of 16 stents in me" (08/28/2016)   CORONARY BALLOON ANGIOPLASTY Right 08/28/2016   Procedure: Coronary Balloon Angioplasty;  Surgeon: Lyn Records, MD;  Location: [facility] INVASIVE CV LAB;  Service: Cardiovascular;  Laterality: Right;   FRACTURE SURGERY     LEFT HEART CATH AND CORONARY ANGIOGRAPHY N/A 08/28/2016   Procedure: Left Heart Cath and Coronary Angiography;  Surgeon: Lyn Records, MD;  Location: [facility] INVASIVE CV LAB;  Service: Cardiovascular;  Laterality: N/A;   LEFT HEART CATH AND CORONARY ANGIOGRAPHY N/A 10/24/2016   Procedure: LEFT HEART CATH AND CORONARY ANGIOGRAPHY;  Surgeon: Swaziland, Peter M, MD;  Location: [facility] INVASIVE CV LAB;  Service: Cardiovascular;  Laterality: N/A;   TOTAL HIP ARTHROPLASTY Right 1990       Family History  Problem Relation Age of Onset   Heart attack Father    Diabetes Mother     Social History   Tobacco Use   Smoking status: Every Day    Packs/day:  1.00    Years: 1.00    Pack years: 1.00    Types: Cigarettes   Smokeless tobacco: Never   Tobacco comments:    "quit smoking in the 1990s"/ started back smoking at first of 2019  Vaping Use   Vaping Use: Never used  Substance Use Topics   Alcohol use: Yes    Comment: 08/28/2016 "stopped 10-15 yr ago"   Drug use: Not Currently    Home Medications Prior to Admission medications   Medication Sig Start Date End Date Taking? Authorizing Provider  acetaminophen (TYLENOL) 500 MG tablet Take 500-1,000 mg by mouth every 6 (six) hours as needed for mild pain (or headaches).   Yes [provider]  aspirin 81 MG EC tablet TAKE 1 TABLET (81 MG TOTAL) BY MOUTH DAILY. SWALLOW WHOLE.  05/15/20 05/15/21 Yes Georgie Chard D, NP  atorvastatin (LIPITOR) 80 MG tablet Take 1 tablet (80 mg total) by mouth daily. 05/16/20  Yes Georgie Chard D, NP  clopidogrel (PLAVIX) 75 MG tablet Take 1 tablet (75 mg total) by mouth daily. 05/15/20  Yes Georgie Chard D, NP  levETIRAcetam (KEPPRA) 500 MG tablet Take 1 tablet (500 mg total) by mouth 2 (two) times daily. 07/23/20  Yes Berna Bue, MD  naproxen sodium (ALEVE) 220 MG tablet Take 220-440 mg by mouth 2 (two) times daily as needed (for mild pain or headaches).   Yes [provider]  nitroGLYCERIN (NITROSTAT) 0.4 MG SL tablet Place 1 tablet (0.4 mg total) under the tongue every 5 (five) minutes x 3 doses as needed for chest pain. 01/02/18  Yes Georgie Chard D, NP  ranolazine (RANEXA) 500 MG 12 hr tablet Take 1 tablet (500 mg total) by mouth 2 (two) times daily. 11/16/20  Yes Holley Dexter, MD  tiZANidine (ZANAFLEX) 2 MG tablet Take 1 tablet (2 mg total) by mouth every 8 (eight) hours as needed for muscle spasms. 09/21/20  Yes Rhys Martini, PA-C  aspirin EC 81 MG EC tablet Take 1 tablet (81 mg total) by mouth daily. Swallow whole. 05/16/20   Filbert Schilder, NP  atorvastatin (LIPITOR) 80 MG tablet TAKE 1 TABLET (80 MG TOTAL) BY MOUTH DAILY. 05/15/20 05/15/21  Georgie Chard D, NP  benzonatate (TESSALON) 100 MG capsule Take 1 capsule (100 mg total) by mouth every 8 (eight) hours. Patient not taking: No sig reported 11/08/20   Koleen Distance, MD  clopidogrel (PLAVIX) 75 MG tablet TAKE 1 TABLET (75 MG TOTAL) BY MOUTH DAILY. 05/15/20 05/15/21  Filbert Schilder, NP    Allergies    Patient has no known allergies.  Review of Systems   Review of Systems  Constitutional:  Negative for chills and fever.  HENT:  Negative for ear pain and sore throat.   Eyes:  Negative for pain and visual disturbance.  Respiratory:  Positive for shortness of breath. Negative for cough.   Cardiovascular:  Positive for chest pain. Negative for palpitations.   Gastrointestinal:  Negative for abdominal pain and vomiting.  Genitourinary:  Negative for dysuria and hematuria.  Musculoskeletal:  Negative for arthralgias and back pain.  Skin:  Negative for color change and rash.  Neurological:  Negative for seizures and syncope.  All other systems reviewed and are negative.  Physical Exam Updated Vital Signs BP 105/71   Pulse (!) 43   Temp 98.1 F (36.7 C) (Oral)   Resp 17   Ht 5\' 9"  (1.753 m)   Wt 95.3 kg   SpO2 99%  BMI 31.01 kg/m   Physical Exam  ED Results / Procedures / Treatments   Labs (all labs ordered are listed, but only abnormal results are displayed) Labs Reviewed  CBC WITH DIFFERENTIAL/PLATELET - Abnormal; Notable for the following components:      Result Value   RBC 3.23 (*)    Hemoglobin 11.2 (*)    HCT 33.0 (*)    MCV 102.2 (*)    MCH 34.7 (*)    Platelets 124 (*)    All other components within normal limits  COMPREHENSIVE METABOLIC PANEL - Abnormal; Notable for the following components:   Potassium 2.8 (*)    Chloride 117 (*)    CO2 21 (*)    BUN 5 (*)    Creatinine, Ser 0.55 (*)    Calcium 6.4 (*)    Total Protein 4.1 (*)    Albumin 2.4 (*)    Anion gap 2 (*)    All other components within normal limits  I-STAT CHEM 8, ED - Abnormal; Notable for the following components:   BUN 6 (*)    Hemoglobin 12.9 (*)    HCT 38.0 (*)    All other components within normal limits  TROPONIN I (HIGH SENSITIVITY)  TROPONIN I (HIGH SENSITIVITY)    EKG EKG Interpretation  Date/Time:  Friday November 16 2020 19:31:20 EDT Ventricular Rate:  53 PR Interval:  124 QRS Duration: 93 QT Interval:  409 QTC Calculation: 384 R Axis:   76 Text Interpretation: Sinus rhythm Borderline repolarization abnormality ST elevation, consider anterior injury No significant change since last tracing Confirmed by Richardean Canal [phone]) on 11/16/2020 7:50:27 PM Also confirmed by Richardean Canal [phone]), editor Dianah Field [phone])  on  11/17/2020 9:42:51 AM  Radiology DG Chest Portable 1 View  Result Date: 11/16/2020 CLINICAL DATA:  Chest pain. EXAM: PORTABLE CHEST 1 VIEW COMPARISON:  November 07, 2020 FINDINGS: The heart size and mediastinal contours are within normal limits. Both lungs are clear. The visualized skeletal structures are unremarkable. IMPRESSION: No active disease. Electronically Signed   By: Aram Candela M.D.   On: 11/16/2020 20:56    Procedures Procedures   Medications Ordered in ED Medications  morphine 4 MG/ML injection 4 mg (4 mg Intravenous Given 11/16/20 2152)    ED Course  I have reviewed the triage vital signs and the nursing notes.  Pertinent labs & imaging results that were available during my care of the patient were reviewed by me and considered in my medical decision making (see chart for details).    MDM Rules/Calculators/A&P                           60 y.o. male with past medical history as above who presents for evaluation of chest pain. Afebrile and hemodynamically stable.  Exam as detailed above.  Initial differential includes (but is not limited to): ACS, PNA, PE, Aortic Dissection, Pancreatitis, Arrhythmia, Pneumothorax, Endo/Myo/Pericarditis, Shingles, Emergent complications of an Ulcer, Esophageal pathology.   Initial lab work appeared contaminated, with very strange results for both CBC and CMP.  Repeat i-STAT demonstrates stable noncritical anemia, but no electrolyte derangements noted on the initial erroneous CMP.  EKG is NSR without evidence of acute ischemia or arrhythmia.  The CXR assists in ruling out pneumonia, Pneumothorax, and Esophageal Tears.  No focal lung findings suggestive of pneumonia or pneumothorax.  I have low suspicion for pulmonary embolism.  Patient denies any fevers, positional change in  chest pain with leaning forward or lying down and has no suggestive EKG changes, making both pericarditis and myocarditis less likely. There does not appear to be an  Aortic Dissection either, based on physical exam, historically pain not abrupt in onset, tearing or ripping, pulses symmetric, and absence of acute focal neurologic deficit.  Musculoskeletal strain and costochondritis are also of consideration; however, pain is not reproducible with palpation.   The patient's risk factors for ACS were reviewed as well as the EKG. As reviewed above, EKG without evidence of ischemia or arrhythmia.  HEAR score ineligible due to history of CAD and stenting.  Serial troponins are negative.  Patient reports resolution of his chest pain following administration of morphine.  Case was discussed with cardiologist on-call, Dr. Laren Everts, who has low suspicion for ACS at this time.  He recommends discharge on Ranexa 500 twice daily and outpatient follow-up.  He does not recommend hospital admission, provocative testing, or LHC at this time.  Patient was discharged with Ranexa 500 twice daily.   Final Clinical Impression(s) / ED Diagnoses Final diagnoses:  Chest pain, unspecified type    Rx / DC Orders ED Discharge Orders          Ordered    ranolazine (RANEXA) 500 MG 12 hr tablet  2 times daily        11/16/20 2314             Holley Dexter, MD 11/17/20 1241    Charlynne Pander, MD 11/17/20 1510

## 2020-11-19 ENCOUNTER — Telehealth: Payer: Self-pay

## 2020-11-19 NOTE — Telephone Encounter (Signed)
Transition Care Management Unsuccessful Follow-up Telephone Call  Date of discharge and from where:  11/16/20 from [facility]  Attempts:  1st Attempt  Reason for unsuccessful TCM follow-up call:  Unable to reach patient

## 2020-11-25 ENCOUNTER — Emergency Department (HOSPITAL_COMMUNITY)

## 2020-11-25 ENCOUNTER — Other Ambulatory Visit: Payer: Self-pay

## 2020-11-25 ENCOUNTER — Emergency Department (HOSPITAL_COMMUNITY)
Admission: EM | Admit: 2020-11-25 | Discharge: 2020-11-26 | Disposition: A | Discharge status: Home / Self Care | Attending: Emergency Medicine | Admitting: Emergency Medicine

## 2020-11-25 DIAGNOSIS — I509 Heart failure, unspecified: Secondary | ICD-10-CM | POA: Diagnosis not present

## 2020-11-25 DIAGNOSIS — R079 Chest pain, unspecified: Secondary | ICD-10-CM | POA: Insufficient documentation

## 2020-11-25 DIAGNOSIS — Z96641 Presence of right artificial hip joint: Secondary | ICD-10-CM | POA: Diagnosis not present

## 2020-11-25 DIAGNOSIS — Z8616 Personal history of COVID-19: Secondary | ICD-10-CM | POA: Diagnosis not present

## 2020-11-25 DIAGNOSIS — Z7902 Long term (current) use of antithrombotics/antiplatelets: Secondary | ICD-10-CM | POA: Insufficient documentation

## 2020-11-25 DIAGNOSIS — R0789 Other chest pain: Secondary | ICD-10-CM | POA: Diagnosis not present

## 2020-11-25 DIAGNOSIS — I110 Hypertensive heart disease with heart failure: Secondary | ICD-10-CM | POA: Diagnosis not present

## 2020-11-25 DIAGNOSIS — R0602 Shortness of breath: Secondary | ICD-10-CM | POA: Diagnosis not present

## 2020-11-25 DIAGNOSIS — F17210 Nicotine dependence, cigarettes, uncomplicated: Secondary | ICD-10-CM | POA: Insufficient documentation

## 2020-11-25 DIAGNOSIS — M79602 Pain in left arm: Secondary | ICD-10-CM | POA: Insufficient documentation

## 2020-11-25 DIAGNOSIS — Z7982 Long term (current) use of aspirin: Secondary | ICD-10-CM | POA: Diagnosis not present

## 2020-11-25 DIAGNOSIS — I25110 Atherosclerotic heart disease of native coronary artery with unstable angina pectoris: Secondary | ICD-10-CM | POA: Insufficient documentation

## 2020-11-25 LAB — BASIC METABOLIC PANEL
Anion gap: 6 (ref 5–15)
BUN: 7 mg/dL — ABNORMAL LOW (ref 8–23)
CO2: 26 mmol/L (ref 22–32)
Calcium: 9.1 mg/dL (ref 8.9–10.3)
Chloride: 106 mmol/L (ref 98–111)
Creatinine, Ser: 0.84 mg/dL (ref 0.61–1.24)
GFR, Estimated: >60 mL/min (ref >60)
Glucose, Bld: 127 mg/dL — ABNORMAL HIGH (ref 70–99)
Potassium: 3.8 mmol/L (ref 3.5–5.1)
Sodium: 138 mmol/L (ref 135–145)

## 2020-11-25 LAB — CBC
HCT: 41.7 % (ref 39.0–52.0)
Hemoglobin: 14.7 g/dL (ref 13.0–17.0)
MCH: 34.8 pg — ABNORMAL HIGH (ref 26.0–34.0)
MCHC: 35.3 g/dL (ref 30.0–36.0)
MCV: 98.6 fL (ref 80.0–100.0)
Platelets: 164 10*3/uL (ref 150–400)
RBC: 4.23 MIL/uL (ref 4.22–5.81)
RDW: 12.3 % (ref 11.5–15.5)
WBC: 7.8 10*3/uL (ref 4.0–10.5)
nRBC: 0 % (ref 0.0–0.2)

## 2020-11-25 LAB — TROPONIN I (HIGH SENSITIVITY): Troponin I (High Sensitivity): 6 ng/L (ref <18)

## 2020-11-25 NOTE — ED Triage Notes (Signed)
Pt from [facility] via [facility] for c/o CP x1.5hrs. 324ASA, 3NTG, no relief. Covid+ 9/13, per EMS pt coughing en route.  Hx 16 cardiac stents, hx MI (90's)  18LAC VSS

## 2020-11-26 ENCOUNTER — Other Ambulatory Visit: Payer: Self-pay

## 2020-11-26 LAB — TROPONIN I (HIGH SENSITIVITY): Troponin I (High Sensitivity): 6 ng/L (ref <18)

## 2020-11-26 MED ORDER — RANOLAZINE ER 500 MG PO TB12
500.0000 mg | ORAL_TABLET | Freq: Two times a day (BID) | ORAL | Status: DC
  Administered 2020-11-26: 500 mg via ORAL
  Filled 2020-11-26: qty 1

## 2020-11-26 MED ORDER — RANOLAZINE ER 500 MG PO TB12: 500.0000 mg | ORAL_TABLET | Freq: Two times a day (BID) | ORAL | 0 refills | Status: DC

## 2020-11-26 NOTE — Telephone Encounter (Signed)
Transition Care Management Unsuccessful Follow-up Telephone Call  Date of discharge and from where:  11/16/2020 from [facility]  Attempts:  2nd Attempt  Reason for unsuccessful TCM follow-up call:  Unable to reach patient

## 2020-11-26 NOTE — ED Provider Notes (Signed)
Connor Baker [facility] EMERGENCY DEPARTMENT Provider Note   CSN: [id number] Arrival date & time: 11/25/20  1918     History Chief Complaint  Patient presents with   Chest Pain    Connor Baker is a 60 y.o. male.  The history is provided by the patient and medical records.  Chest Pain Connor Baker is a 60 y.o. male who presents to the Emergency Department complaining of chest pain. He presents to the emergency department complaining of central chest pain that started just prior to ED arrival. Pain is described as a squeezing pressure that radiates to his left arm. No significant change with nitroglycerin. He has mild associated shortness of breath. He did have one episode of emesis and a bowel movement earlier. No current nausea. No fevers, cough. No leg swelling or pain. He is compliant with medications. He did recently have COVID-19 infection in September.    Past Medical History:  Diagnosis Date   Arthritis    "legs" (08/28/2016)   CHF (congestive heart failure) (HCC)    Chronic lower back pain    Coronary artery disease    CVA (cerebral vascular accident) (HCC)    Depression    GERD (gastroesophageal reflux disease)    High cholesterol    Hypertension    MI (myocardial infarction) (HCC) 1992; 1994; 1995   MI (myocardial infarction) (HCC)    [phone]   On home oxygen therapy    "4L just at night w/my CPAP" (08/28/2016)   OSA (obstructive sleep apnea)    w/oxygen" (08/28/2016)   Stroke (HCC) 2005   "mild one; faded my memory" (08/28/2016)    Patient Active Problem List   Diagnosis Date Noted   Subdural hematoma 07/21/2020   Chest pain 05/15/2020   Unstable angina (HCC) 12/31/2017   Tobacco abuse 10/11/2017   Hyperlipidemia 08/29/2016   CAD (coronary artery disease), native coronary artery 08/28/2016   Hypertension, essential     Past Surgical History:  Procedure Laterality Date   ANKLE FRACTURE SURGERY Right 2016   CORONARY ANGIOPLASTY     CORONARY  ANGIOPLASTY WITH STENT PLACEMENT  1992 - 2017   "I've got a total of 16 stents in me" (08/28/2016)   CORONARY BALLOON ANGIOPLASTY Right 08/28/2016   Procedure: Coronary Balloon Angioplasty;  Surgeon: Lyn Records, MD;  Location: [facility] INVASIVE CV LAB;  Service: Cardiovascular;  Laterality: Right;   FRACTURE SURGERY     LEFT HEART CATH AND CORONARY ANGIOGRAPHY N/A 08/28/2016   Procedure: Left Heart Cath and Coronary Angiography;  Surgeon: Lyn Records, MD;  Location: [facility] INVASIVE CV LAB;  Service: Cardiovascular;  Laterality: N/A;   LEFT HEART CATH AND CORONARY ANGIOGRAPHY N/A 10/24/2016   Procedure: LEFT HEART CATH AND CORONARY ANGIOGRAPHY;  Surgeon: Swaziland, Peter M, MD;  Location: [facility] INVASIVE CV LAB;  Service: Cardiovascular;  Laterality: N/A;   TOTAL HIP ARTHROPLASTY Right 1990       Family History  Problem Relation Age of Onset   Heart attack Father    Diabetes Mother     Social History   Tobacco Use   Smoking status: Every Day    Packs/day: 1.00    Years: 1.00    Pack years: 1.00    Types: Cigarettes   Smokeless tobacco: Never   Tobacco comments:    "quit smoking in the 1990s"/ started back smoking at first of 2019  Vaping Use   Vaping Use: Never used  Substance Use Topics   Alcohol use:  Yes    Comment: 08/28/2016 "stopped 10-15 yr ago"   Drug use: Not Currently    Home Medications Prior to Admission medications   Medication Sig Start Date End Date Taking? Authorizing Provider  acetaminophen (TYLENOL) 500 MG tablet Take 500-1,000 mg by mouth every 6 (six) hours as needed for mild pain (or headaches).    [provider]  aspirin 81 MG EC tablet TAKE 1 TABLET (81 MG TOTAL) BY MOUTH DAILY. SWALLOW WHOLE. 05/15/20 05/15/21  Georgie Chard D, NP  aspirin EC 81 MG EC tablet Take 1 tablet (81 mg total) by mouth daily. Swallow whole. 05/16/20   Filbert Schilder, NP  atorvastatin (LIPITOR) 80 MG tablet Take 1 tablet (80 mg total) by mouth daily. 05/16/20   Filbert Schilder, NP   atorvastatin (LIPITOR) 80 MG tablet TAKE 1 TABLET (80 MG TOTAL) BY MOUTH DAILY. 05/15/20 05/15/21  Georgie Chard D, NP  benzonatate (TESSALON) 100 MG capsule Take 1 capsule (100 mg total) by mouth every 8 (eight) hours. Patient not taking: No sig reported 11/08/20   Koleen Distance, MD  clopidogrel (PLAVIX) 75 MG tablet Take 1 tablet (75 mg total) by mouth daily. 05/15/20   Georgie Chard D, NP  clopidogrel (PLAVIX) 75 MG tablet TAKE 1 TABLET (75 MG TOTAL) BY MOUTH DAILY. 05/15/20 05/15/21  Georgie Chard D, NP  levETIRAcetam (KEPPRA) 500 MG tablet Take 1 tablet (500 mg total) by mouth 2 (two) times daily. 07/23/20   Berna Bue, MD  naproxen sodium (ALEVE) 220 MG tablet Take 220-440 mg by mouth 2 (two) times daily as needed (for mild pain or headaches).    [provider]  nitroGLYCERIN (NITROSTAT) 0.4 MG SL tablet Place 1 tablet (0.4 mg total) under the tongue every 5 (five) minutes x 3 doses as needed for chest pain. 01/02/18   Georgie Chard D, NP  ranolazine (RANEXA) 500 MG 12 hr tablet Take 1 tablet (500 mg total) by mouth 2 (two) times daily. 11/26/20   Tilden Fossa, MD  tiZANidine (ZANAFLEX) 2 MG tablet Take 1 tablet (2 mg total) by mouth every 8 (eight) hours as needed for muscle spasms. 09/21/20   Rhys Martini, PA-C    Allergies    Patient has no known allergies.  Review of Systems   Review of Systems  Cardiovascular:  Positive for chest pain.  All other systems reviewed and are negative.  Physical Exam Updated Vital Signs BP 112/68   Pulse (!) 46   Temp 98.6 F (37 C) (Oral)   Resp 11   Ht 5\' 9"  (1.753 m)   Wt 95.3 kg   SpO2 98%   BMI 31.01 kg/m   Physical Exam Vitals and nursing note reviewed.  Constitutional:      Appearance: He is well-developed.  HENT:     Head: Normocephalic and atraumatic.  Cardiovascular:     Rate and Rhythm: Normal rate and regular rhythm.     Heart sounds: No murmur heard. Pulmonary:     Effort: Pulmonary effort is normal.  No respiratory distress.     Breath sounds: Normal breath sounds.  Abdominal:     Palpations: Abdomen is soft.     Tenderness: There is no abdominal tenderness. There is no guarding or rebound.  Musculoskeletal:        General: No swelling or tenderness.     Comments: 2+ radial and DP pulses bilaterally.    Skin:    General: Skin is warm and dry.  Neurological:     Mental Status: He is alert and oriented to person, place, and time.  Psychiatric:        Behavior: Behavior normal.    ED Results / Procedures / Treatments   Labs (all labs ordered are listed, but only abnormal results are displayed) Labs Reviewed  BASIC METABOLIC PANEL - Abnormal; Notable for the following components:      Result Value   Glucose, Bld 127 (*)    BUN 7 (*)    All other components within normal limits  CBC - Abnormal; Notable for the following components:   MCH 34.8 (*)    All other components within normal limits  TROPONIN I (HIGH SENSITIVITY)  TROPONIN I (HIGH SENSITIVITY)    EKG EKG Interpretation  Date/Time:  Sunday November 25 2020 23:43:16 EDT Ventricular Rate:  48 PR Interval:  134 QRS Duration: 103 QT Interval:  428 QTC Calculation: 383 R Axis:   75 Text Interpretation: Sinus bradycardia Minimal ST elevation, anterior leads Confirmed by Adylin Hankey ([id number]) on 11/26/2020 12:01:26 AM  Radiology DG Chest 2 View  Result Date: 11/25/2020 CLINICAL DATA:  Chest pain. EXAM: CHEST - 2 VIEW COMPARISON:  Chest x-ray 11/16/2020. FINDINGS: The heart size and mediastinal contours are within normal limits. Both lungs are clear. The visualized skeletal structures are unremarkable. IMPRESSION: No active cardiopulmonary disease. Electronically Signed   By: Amy  Guttmann M.D.   On: 11/25/2020 20:32    Procedures Procedures   Medications Ordered in ED Medications  ranolazine (RANEXA) 12 hr tablet 500 mg (500 mg Oral Given 11/26/20 0147)    ED Course  I have reviewed the triage vital signs and  the nursing notes.  Pertinent labs & imaging results that were available during my care of the patient were reviewed by me and considered in my medical decision making (see chart for details).    MDM Rules/Calculators/A&P                          patient with extensive cardiac history here for evaluation of recurrent chest pain. EKG is abnormal but similar when compared to priors. Troponins are negative times two. He has been recently evaluated by cardiology with plan to initiate Ranexa, which he has not received in jail. Will re-prescribe this medication. Plan to discharge home with outpatient follow-up and return precautions. Current presentation is not consistent with ACS, PE, dissection, pneumonia.  Final Clinical Impression(s) / ED Diagnoses Final diagnoses:  Nonspecific chest pain    Rx / DC Orders ED Discharge Orders          Ordered    ranolazine (RANEXA) 500 MG 12 hr tablet  2 times daily        10 /03/22 0143             01-17-1993, MD 11/26/20 0149

## 2020-11-27 ENCOUNTER — Telehealth: Payer: Self-pay

## 2020-11-27 NOTE — Telephone Encounter (Signed)
Transition Care Management Unsuccessful Follow-up Telephone Call  Date of discharge and from where:  11/26/2020-[place]  Attempts:  1st Attempt  Reason for unsuccessful TCM follow-up call:  Unable to reach patient

## 2020-11-27 NOTE — Telephone Encounter (Signed)
Transition Care Management Unsuccessful Follow-up Telephone Call  Date of discharge and from where:  11/16/2020 from [facility]  Attempts:  3rd Attempt  Reason for unsuccessful TCM follow-up call:  Unable to reach patient

## 2020-11-28 NOTE — Telephone Encounter (Signed)
Transition Care Management Unsuccessful Follow-up Telephone Call  Date of discharge and from where:  11/26/2020 from [facility]  Attempts:  2nd Attempt  Reason for unsuccessful TCM follow-up call:  Unable to reach patient

## 2020-11-29 NOTE — Telephone Encounter (Signed)
Transition Care Management Unsuccessful Follow-up Telephone Call  Date of discharge and from where:  11/26/2020 from [facility]  Attempts:  3rd Attempt  Reason for unsuccessful TCM follow-up call:  Unable to reach patient
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
## 2021-12-28 ENCOUNTER — Encounter (HOSPITAL_COMMUNITY): Payer: Self-pay

## 2021-12-28 ENCOUNTER — Other Ambulatory Visit: Payer: Self-pay

## 2021-12-28 ENCOUNTER — Emergency Department (HOSPITAL_COMMUNITY): Payer: Medicaid Other

## 2021-12-28 ENCOUNTER — Emergency Department (HOSPITAL_COMMUNITY)
Admission: EM | Admit: 2021-12-28 | Discharge: 2021-12-28 | Discharge status: Against Medical Advice | Payer: Medicaid Other | Attending: Emergency Medicine | Admitting: Emergency Medicine

## 2021-12-28 DIAGNOSIS — I2510 Atherosclerotic heart disease of native coronary artery without angina pectoris: Secondary | ICD-10-CM | POA: Diagnosis not present

## 2021-12-28 DIAGNOSIS — F17210 Nicotine dependence, cigarettes, uncomplicated: Secondary | ICD-10-CM | POA: Insufficient documentation

## 2021-12-28 DIAGNOSIS — Z951 Presence of aortocoronary bypass graft: Secondary | ICD-10-CM | POA: Diagnosis not present

## 2021-12-28 DIAGNOSIS — R1013 Epigastric pain: Secondary | ICD-10-CM | POA: Insufficient documentation

## 2021-12-28 DIAGNOSIS — F10929 Alcohol use, unspecified with intoxication, unspecified: Secondary | ICD-10-CM | POA: Diagnosis not present

## 2021-12-28 DIAGNOSIS — Z96641 Presence of right artificial hip joint: Secondary | ICD-10-CM | POA: Diagnosis not present

## 2021-12-28 DIAGNOSIS — R0602 Shortness of breath: Secondary | ICD-10-CM | POA: Insufficient documentation

## 2021-12-28 DIAGNOSIS — R079 Chest pain, unspecified: Secondary | ICD-10-CM | POA: Diagnosis not present

## 2021-12-28 DIAGNOSIS — Y907 Blood alcohol level of 200-239 mg/100 ml: Secondary | ICD-10-CM | POA: Diagnosis not present

## 2021-12-28 DIAGNOSIS — R52 Pain, unspecified: Secondary | ICD-10-CM | POA: Diagnosis not present

## 2021-12-28 DIAGNOSIS — I509 Heart failure, unspecified: Secondary | ICD-10-CM | POA: Insufficient documentation

## 2021-12-28 DIAGNOSIS — R4781 Slurred speech: Secondary | ICD-10-CM | POA: Diagnosis not present

## 2021-12-28 DIAGNOSIS — R0789 Other chest pain: Secondary | ICD-10-CM | POA: Diagnosis not present

## 2021-12-28 DIAGNOSIS — I110 Hypertensive heart disease with heart failure: Secondary | ICD-10-CM | POA: Diagnosis not present

## 2021-12-28 DIAGNOSIS — R0689 Other abnormalities of breathing: Secondary | ICD-10-CM | POA: Diagnosis not present

## 2021-12-28 LAB — COMPREHENSIVE METABOLIC PANEL
ALT: 23 U/L (ref 0–44)
AST: 23 U/L (ref 15–41)
Albumin: 4 g/dL (ref 3.5–5.0)
Alkaline Phosphatase: 72 U/L (ref 38–126)
Anion gap: 8 (ref 5–15)
BUN: 8 mg/dL (ref 8–23)
CO2: 22 mmol/L (ref 22–32)
Calcium: 8.8 mg/dL — ABNORMAL LOW (ref 8.9–10.3)
Chloride: 108 mmol/L (ref 98–111)
Creatinine, Ser: 0.79 mg/dL (ref 0.61–1.24)
GFR, Estimated: >60 mL/min (ref >60)
Glucose, Bld: 102 mg/dL — ABNORMAL HIGH (ref 70–99)
Potassium: 3.3 mmol/L — ABNORMAL LOW (ref 3.5–5.1)
Sodium: 138 mmol/L (ref 135–145)
Total Bilirubin: 0.6 mg/dL (ref 0.3–1.2)
Total Protein: 7.1 g/dL (ref 6.5–8.1)

## 2021-12-28 LAB — CBC
HCT: 46.1 % (ref 39.0–52.0)
Hemoglobin: 16.4 g/dL (ref 13.0–17.0)
MCH: 34.9 pg — ABNORMAL HIGH (ref 26.0–34.0)
MCHC: 35.6 g/dL (ref 30.0–36.0)
MCV: 98.1 fL (ref 80.0–100.0)
Platelets: 194 10*3/uL (ref 150–400)
RBC: 4.7 MIL/uL (ref 4.22–5.81)
RDW: 13 % (ref 11.5–15.5)
WBC: 9.9 10*3/uL (ref 4.0–10.5)
nRBC: 0 % (ref 0.0–0.2)

## 2021-12-28 LAB — ETHANOL: Alcohol, Ethyl (B): 203 mg/dL — ABNORMAL HIGH (ref <10)

## 2021-12-28 LAB — TROPONIN I (HIGH SENSITIVITY)
Troponin I (High Sensitivity): 5 ng/L (ref <18)
Troponin I (High Sensitivity): 4 ng/L (ref <18)

## 2021-12-28 LAB — LIPASE, BLOOD: Lipase: 31 U/L (ref 11–51)

## 2021-12-28 MED ORDER — ALUM & MAG HYDROXIDE-SIMETH 200-200-20 MG/5ML PO SUSP
30.0000 mL | Freq: Once | ORAL | Status: AC
  Administered 2021-12-28: 30 mL via ORAL
  Filled 2021-12-28: qty 30

## 2021-12-28 MED ORDER — FAMOTIDINE IN NACL 20-0.9 MG/50ML-% IV SOLN
20.0000 mg | Freq: Once | INTRAVENOUS | Status: AC
  Administered 2021-12-28: 20 mg via INTRAVENOUS
  Filled 2021-12-28: qty 50

## 2021-12-28 NOTE — ED Triage Notes (Addendum)
Pt presents to ED via [facility] with c/o chest pain, pt is intoxicated, cussing at staff, saying "I'm having a fucking heart attack and I'm at the wrong hospital." Pt endorses drinking ETOH since this afternoon, pt says chest pain started this afternoon before he started drinking. Pt reports he hasn't took his prescribed medications "b/c he can't afford them"  Pt has a significant cardiac history. Pt had nitro and ASA given by EMS in route with no change in pain level.

## 2021-12-28 NOTE — ED Notes (Signed)
Pt pulled out IV, pulled off all monitoring equipment, says he needs to leave, pt escorted back to stretcher, guaze applied over IV site, cath was found intact on floor, advised pt that is was still dark outside and cold and he did not have way home, so the best thing he could do right now is to lay back down and sleep, so he can sober up and go home at daylight. Pt lying down at this time. Dr Sedonia Small made aware.

## 2021-12-28 NOTE — ED Notes (Addendum)
Pt seen leaving- Dr Sedonia Small aware- pt left ambulatory with steady gait walking down hallway prior to receiving discharge paperwork.

## 2021-12-28 NOTE — ED Notes (Signed)
Pt given warm blanket, lights dimmed for comfort, pt dozing to sleep at this time.

## 2021-12-28 NOTE — ED Notes (Signed)
ED Provider at bedside. 

## 2021-12-28 NOTE — ED Provider Notes (Signed)
[facility]-EMERGENCY DEPT [facility] Emergency Department Provider Note MRN:  [id number]  Arrival date & time: 12/28/21     Chief Complaint   Chest Pain and Alcohol Intoxication   History of Present Illness   Connor Baker is a 60 y.o. year-old male with a history of CAD, stroke, CHF presenting to the ED with chief complaint of chest pain.  Lower chest/epigastric chest pain this evening.  Drinking alcohol this evening.  Denies any dizziness or diaphoresis, no nausea vomiting.  Chest pain associated with shortness of breath.  Has had multiple stents for heart attacks in the past.  Review of Systems  A thorough review of systems was obtained and all systems are negative except as noted in the HPI and PMH.   Patient's Health History    Past Medical History:  Diagnosis Date   Arthritis    "legs" (08/28/2016)   CHF (congestive heart failure) (HCC)    Chronic lower back pain    Coronary artery disease    CVA (cerebral vascular accident) (HCC)    Depression    GERD (gastroesophageal reflux disease)    High cholesterol    Hypertension    MI (myocardial infarction) (HCC) 1992; 1994; 1995   MI (myocardial infarction) (HCC)    [phone]   On home oxygen therapy    "4L just at night w/my CPAP" (08/28/2016)   OSA (obstructive sleep apnea)    w/oxygen" (08/28/2016)   Stroke (HCC) 2005   "mild one; faded my memory" (08/28/2016)    Past Surgical History:  Procedure Laterality Date   ANKLE FRACTURE SURGERY Right 2016   CORONARY ANGIOPLASTY     CORONARY ANGIOPLASTY WITH STENT PLACEMENT  1992 - 2017   "I've got a total of 16 stents in me" (08/28/2016)   CORONARY BALLOON ANGIOPLASTY Right 08/28/2016   Procedure: Coronary Balloon Angioplasty;  Surgeon: Lyn Records, MD;  Location: [facility] INVASIVE CV LAB;  Service: Cardiovascular;  Laterality: Right;   FRACTURE SURGERY     LEFT HEART CATH AND CORONARY ANGIOGRAPHY N/A 08/28/2016   Procedure: Left Heart Cath and Coronary Angiography;  Surgeon:  Lyn Records, MD;  Location: [facility] INVASIVE CV LAB;  Service: Cardiovascular;  Laterality: N/A;   LEFT HEART CATH AND CORONARY ANGIOGRAPHY N/A 10/24/2016   Procedure: LEFT HEART CATH AND CORONARY ANGIOGRAPHY;  Surgeon: [place], Peter M, MD;  Location: [facility] INVASIVE CV LAB;  Service: Cardiovascular;  Laterality: N/A;   TOTAL HIP ARTHROPLASTY Right 1990    Family History  Problem Relation Age of Onset   Heart attack Father    Diabetes Mother     Social History   Socioeconomic History   Marital status: Single    Spouse name: Not on file   Number of children: Not on file   Years of education: Not on file   Highest education level: Not on file  Occupational History   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  Tobacco Use   Smoking status: Every Day    Packs/day: 1.00    Years: 1.00    Total pack years: 1.00    Types: Cigarettes   Smokeless tobacco: Never   Tobacco comments:    "quit smoking in the 1990s"/ started back smoking at first of 2019  Vaping Use   Vaping Use: Never used  Substance and Sexual Activity   Alcohol use: Yes    Comment: beer everyday   Drug use: Not Currently   Sexual activity: Yes  Other Topics Concern   Not on file  Social History Narrative   ** Merged History Encounter **       ** Merged History Encounter **       Social Determinants of Health   Financial Resource Strain: High Risk (10/11/2017)   Overall Financial Resource Strain (CARDIA)    Difficulty of Paying Living Expenses: Hard  Food Insecurity: Not on file  Transportation Needs: Not on file  Physical Activity: Not on file  Stress: Not on file  Social Connections: Not on file  Intimate Partner Violence: Not on file     Physical Exam   Vitals:   12/28/21 0430 12/28/21 0511  BP: 103/81 105/74  Pulse: (!) 55 (!) 56  Resp: 17 18  Temp:  98.1 F (36.7 C)  SpO2: 97% 96%    CONSTITUTIONAL: Chronically ill-appearing, NAD NEURO/PSYCH:  Alert and oriented x 3, no focal deficits EYES:  eyes equal and  reactive ENT/NECK:  no LAD, no JVD CARDIO: Regular rate, well-perfused, normal S1 and S2 PULM:  CTAB no wheezing or rhonchi GI/GU:  non-distended, non-tender MSK/SPINE:  No gross deformities, no edema SKIN:  no rash, atraumatic   *Additional and/or pertinent findings included in MDM below  Diagnostic and Interventional Summary    EKG Interpretation  Date/Time:  Saturday December 28 2021 01:37:39 EDT Ventricular Rate:  64 PR Interval:  141 QRS Duration: 101 QT Interval:  403 QTC Calculation: 416 R Axis:   74 Text Interpretation: Sinus rhythm Inferior infarct, age indeterminate No significant change was found Confirmed by Gerlene Fee [phone]) on 12/28/2021 1:39:40 AM       Labs Reviewed  CBC - Abnormal; Notable for the following components:      Result Value   MCH 34.9 (*)    All other components within normal limits  COMPREHENSIVE METABOLIC PANEL - Abnormal; Notable for the following components:   Potassium 3.3 (*)    Glucose, Bld 102 (*)    Calcium 8.8 (*)    All other components within normal limits  ETHANOL - Abnormal; Notable for the following components:   Alcohol, Ethyl (B) 203 (*)    All other components within normal limits  LIPASE, BLOOD  TROPONIN I (HIGH SENSITIVITY)  TROPONIN I (HIGH SENSITIVITY)    DG Chest Port 1 View  Final Result      Medications  famotidine (PEPCID) IVPB 20 mg premix (0 mg Intravenous Stopped 12/28/21 0246)  alum & mag hydroxide-simeth (MAALOX/MYLANTA) 200-200-20 MG/5ML suspension 30 mL (30 mLs Oral Given 12/28/21 0156)     Procedures  /  Critical Care Procedures  ED Course and Medical Decision Making  Initial Impression and Ddx Differential diagnosis includes ACS, pancreatitis, gastritis, abdomen is soft and nontender, no rebound guarding or rigidity and so doubt perforated viscus or other surgical intra-abdominal process.  Awaiting labs, chest x-ray.  Past medical/surgical history that increases complexity of ED encounter:  CAD  Interpretation of Diagnostics I personally reviewed the EKG and my interpretation is as follows: Sinus rhythm, no significant change from prior  Labs reassuring with no significant blood count or electrolyte disturbance, troponin negative x2.  Ethanol greater than 200.  Patient Reassessment and Ultimate Disposition/Management     Patient allowed to metabolize here in the emergency department, eventually he simply walked out without informing staff.  Patient management required discussion with the following services or consulting groups:  None  Complexity of Problems Addressed Acute illness or injury that poses threat of life of bodily function  Additional Data Reviewed and Analyzed Further history  obtained from: None  Additional Factors Impacting ED Encounter Risk None  Elmer Sow. Pilar Plate, MD [facility] Emergency Medicine [facility] [email or web address]  Final Clinical Impressions(s) / ED Diagnoses     ICD-10-CM   1. Chest pain, unspecified type  R07.9       ED Discharge Orders     None        Discharge Instructions Discussed with and Provided to Patient:   Discharge Instructions   None      Sabas Sous, MD 12/28/21 [phone]

## 2022-04-04 ENCOUNTER — Encounter (HOSPITAL_COMMUNITY): Payer: Self-pay

## 2022-04-04 ENCOUNTER — Emergency Department (HOSPITAL_COMMUNITY): Payer: Medicaid Other

## 2022-04-04 ENCOUNTER — Other Ambulatory Visit: Payer: Self-pay

## 2022-04-04 ENCOUNTER — Inpatient Hospital Stay (HOSPITAL_COMMUNITY)
Admission: EM | Admit: 2022-04-04 | Discharge: 2022-04-08 | DRG: 287 | Payer: Medicaid Other | Attending: Internal Medicine | Admitting: Internal Medicine

## 2022-04-04 DIAGNOSIS — Z79899 Other long term (current) drug therapy: Secondary | ICD-10-CM

## 2022-04-04 DIAGNOSIS — R079 Chest pain, unspecified: Secondary | ICD-10-CM | POA: Diagnosis present

## 2022-04-04 DIAGNOSIS — Z87898 Personal history of other specified conditions: Secondary | ICD-10-CM

## 2022-04-04 DIAGNOSIS — E785 Hyperlipidemia, unspecified: Secondary | ICD-10-CM | POA: Diagnosis present

## 2022-04-04 DIAGNOSIS — M1990 Unspecified osteoarthritis, unspecified site: Secondary | ICD-10-CM | POA: Diagnosis present

## 2022-04-04 DIAGNOSIS — E7800 Pure hypercholesterolemia, unspecified: Secondary | ICD-10-CM | POA: Diagnosis present

## 2022-04-04 DIAGNOSIS — G40909 Epilepsy, unspecified, not intractable, without status epilepticus: Secondary | ICD-10-CM | POA: Diagnosis present

## 2022-04-04 DIAGNOSIS — I252 Old myocardial infarction: Secondary | ICD-10-CM

## 2022-04-04 DIAGNOSIS — Z8673 Personal history of transient ischemic attack (TIA), and cerebral infarction without residual deficits: Secondary | ICD-10-CM

## 2022-04-04 DIAGNOSIS — F32A Depression, unspecified: Secondary | ICD-10-CM | POA: Diagnosis present

## 2022-04-04 DIAGNOSIS — Z96641 Presence of right artificial hip joint: Secondary | ICD-10-CM | POA: Diagnosis present

## 2022-04-04 DIAGNOSIS — I25110 Atherosclerotic heart disease of native coronary artery with unstable angina pectoris: Principal | ICD-10-CM | POA: Diagnosis present

## 2022-04-04 DIAGNOSIS — Z833 Family history of diabetes mellitus: Secondary | ICD-10-CM

## 2022-04-04 DIAGNOSIS — F17210 Nicotine dependence, cigarettes, uncomplicated: Secondary | ICD-10-CM | POA: Diagnosis present

## 2022-04-04 DIAGNOSIS — K219 Gastro-esophageal reflux disease without esophagitis: Secondary | ICD-10-CM | POA: Diagnosis present

## 2022-04-04 DIAGNOSIS — Z9981 Dependence on supplemental oxygen: Secondary | ICD-10-CM

## 2022-04-04 DIAGNOSIS — Z955 Presence of coronary angioplasty implant and graft: Secondary | ICD-10-CM

## 2022-04-04 DIAGNOSIS — I10 Essential (primary) hypertension: Secondary | ICD-10-CM | POA: Diagnosis present

## 2022-04-04 DIAGNOSIS — Z1152 Encounter for screening for COVID-19: Secondary | ICD-10-CM

## 2022-04-04 DIAGNOSIS — G4733 Obstructive sleep apnea (adult) (pediatric): Secondary | ICD-10-CM | POA: Insufficient documentation

## 2022-04-04 DIAGNOSIS — J309 Allergic rhinitis, unspecified: Secondary | ICD-10-CM | POA: Diagnosis present

## 2022-04-04 DIAGNOSIS — Z86718 Personal history of other venous thrombosis and embolism: Secondary | ICD-10-CM

## 2022-04-04 DIAGNOSIS — Z8249 Family history of ischemic heart disease and other diseases of the circulatory system: Secondary | ICD-10-CM

## 2022-04-04 DIAGNOSIS — Z7982 Long term (current) use of aspirin: Secondary | ICD-10-CM

## 2022-04-04 DIAGNOSIS — I700 Atherosclerosis of aorta: Secondary | ICD-10-CM | POA: Diagnosis not present

## 2022-04-04 DIAGNOSIS — Z91148 Patient's other noncompliance with medication regimen for other reason: Secondary | ICD-10-CM

## 2022-04-04 DIAGNOSIS — R0789 Other chest pain: Secondary | ICD-10-CM | POA: Diagnosis not present

## 2022-04-04 DIAGNOSIS — Z7902 Long term (current) use of antithrombotics/antiplatelets: Secondary | ICD-10-CM

## 2022-04-04 LAB — CBC WITH DIFFERENTIAL/PLATELET
Abs Immature Granulocytes: 0.03 10*3/uL (ref 0.00–0.07)
Basophils Absolute: 0.1 10*3/uL (ref 0.0–0.1)
Basophils Relative: 1 %
Eosinophils Absolute: 0.3 10*3/uL (ref 0.0–0.5)
Eosinophils Relative: 3 %
HCT: 38.3 % — ABNORMAL LOW (ref 39.0–52.0)
Hemoglobin: 13.6 g/dL (ref 13.0–17.0)
Immature Granulocytes: 0 %
Lymphocytes Relative: 41 %
Lymphs Abs: 3.1 10*3/uL (ref 0.7–4.0)
MCH: 34.4 pg — ABNORMAL HIGH (ref 26.0–34.0)
MCHC: 35.5 g/dL (ref 30.0–36.0)
MCV: 97 fL (ref 80.0–100.0)
Monocytes Absolute: 0.9 10*3/uL (ref 0.1–1.0)
Monocytes Relative: 12 %
Neutro Abs: 3.2 10*3/uL (ref 1.7–7.7)
Neutrophils Relative %: 43 %
Platelets: 191 10*3/uL (ref 150–400)
RBC: 3.95 MIL/uL — ABNORMAL LOW (ref 4.22–5.81)
RDW: 12.8 % (ref 11.5–15.5)
WBC: 7.5 10*3/uL (ref 4.0–10.5)
nRBC: 0 % (ref 0.0–0.2)

## 2022-04-04 LAB — COMPREHENSIVE METABOLIC PANEL
ALT: 48 U/L — ABNORMAL HIGH (ref 0–44)
AST: 29 U/L (ref 15–41)
Albumin: 3.5 g/dL (ref 3.5–5.0)
Alkaline Phosphatase: 56 U/L (ref 38–126)
Anion gap: 8 (ref 5–15)
BUN: 10 mg/dL (ref 8–23)
CO2: 23 mmol/L (ref 22–32)
Calcium: 8.5 mg/dL — ABNORMAL LOW (ref 8.9–10.3)
Chloride: 107 mmol/L (ref 98–111)
Creatinine, Ser: 0.87 mg/dL (ref 0.61–1.24)
GFR, Estimated: >60 mL/min (ref >60)
Glucose, Bld: 97 mg/dL (ref 70–99)
Potassium: 3.9 mmol/L (ref 3.5–5.1)
Sodium: 138 mmol/L (ref 135–145)
Total Bilirubin: 0.6 mg/dL (ref 0.3–1.2)
Total Protein: 6.1 g/dL — ABNORMAL LOW (ref 6.5–8.1)

## 2022-04-04 LAB — TROPONIN I (HIGH SENSITIVITY): Troponin I (High Sensitivity): 4 ng/L (ref <18)

## 2022-04-04 LAB — LIPASE, BLOOD: Lipase: 31 U/L (ref 11–51)

## 2022-04-04 MED ORDER — ACETAMINOPHEN 500 MG PO TABS
1000.0000 mg | ORAL_TABLET | Freq: Once | ORAL | Status: AC
  Administered 2022-04-04: 1000 mg via ORAL
  Filled 2022-04-04: qty 2

## 2022-04-04 NOTE — ED Provider Notes (Signed)
[facility] Provider Note   CSN: [id number] Arrival date & time: 04/04/22  2240     History  Chief Complaint  Patient presents with   Chest Pain   Shortness of Breath    Connor Baker is a 62 y.o. male.  With PMH of CAD and status post stent, CVA, CHF who presents with ongoing right-sided nonradiating chest pain since approximately 10 PM tonight.  He was given 324 mg aspirin and 4 times nitroglycerin sublingual tablets from EMS without relief.  He complains of pain that is pressure-like associated with mild shortness of breath.  No vomiting or diaphoresis. He has had no recent fevers or URI symptoms.  No leg pain or swelling.  He is concerned because he has been off all medicines and present for the past 4 days.  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   Chest Pain Associated symptoms: shortness of breath   Shortness of Breath Associated symptoms: chest pain        Home Medications Prior to Admission medications   Medication Sig Start Date End Date Taking? Authorizing Provider  acetaminophen (TYLENOL) 500 MG tablet Take 500-1,000 mg by mouth every 6 (six) hours as needed for mild pain (or headaches).    [provider]  aspirin EC 81 MG EC tablet Take 1 tablet (81 mg total) by mouth daily. Swallow whole. 05/16/20   Tommie Raymond, NP  atorvastatin (LIPITOR) 80 MG tablet Take 1 tablet (80 mg total) by mouth daily. 05/16/20   Tommie Raymond, NP  atorvastatin (LIPITOR) 80 MG tablet TAKE 1 TABLET (80 MG TOTAL) BY MOUTH DAILY. 05/15/20 05/15/21  Kathyrn Drown D, NP  benzonatate (TESSALON) 100 MG capsule Take 1 capsule (100 mg total) by mouth every 8 (eight) hours. Patient not taking: No sig reported 11/08/20   Arnaldo Natal, MD  clopidogrel (PLAVIX) 75 MG tablet Take 1 tablet (75 mg total) by mouth daily. 05/15/20   Tommie Raymond, NP  levETIRAcetam  (KEPPRA) 500 MG tablet Take 1 tablet (500 mg total) by mouth 2 (two) times daily. 07/23/20   Clovis Riley, MD  naproxen sodium (ALEVE) 220 MG tablet Take 220-440 mg by mouth 2 (two) times daily as needed (for mild pain or headaches).    [provider]  nitroGLYCERIN (NITROSTAT) 0.4 MG SL tablet Place 1 tablet (0.4 mg total) under the tongue every 5 (five) minutes x 3 doses as needed for chest pain. 01/02/18   Kathyrn Drown D, NP  ranolazine (RANEXA) 500 MG 12 hr tablet Take 1 tablet (500 mg total) by mouth 2 (two) times daily. 11/26/20   Quintella Reichert, MD  tiZANidine (ZANAFLEX) 2 MG tablet Take 1 tablet (2 mg total) by mouth every 8 (eight) hours as needed for muscle spasms. 09/21/20   Hazel Sams, PA-C      Allergies    Patient has no known allergies.    Review of Systems   Review of Systems  Respiratory:  Positive for shortness of breath.   Cardiovascular:  Positive for chest pain.    Physical Exam Updated Vital Signs BP 110/77   Pulse 76   Temp 98.5 F (36.9 C) (Oral)   Resp 17   Ht 5' 10"$  (1.778 m)   Wt 88.9 kg   SpO2 100%   BMI 28.12 kg/m  Physical Exam Constitutional: Alert  and oriented. nontoxic Eyes: Conjunctivae are normal. ENT      Head: Normocephalic and atraumatic. Cardiovascular: S1, S2,  Normal and symmetric distal pulses are present in all extremities.Warm and well perfused. Respiratory: Normal respiratory effort. Breath sounds are normal. Gastrointestinal: Soft and nontender.  Musculoskeletal: Normal range of motion in all extremities.      Right lower leg: No tenderness or edema.      Left lower leg: No tenderness or edema. Neurologic: Normal speech and language.  Moving all extremities equally.  Sensation grossly intact.  No gross focal neurologic deficits are appreciated. Skin: Skin is warm, dry and intact. No rash noted. Psychiatric: Mood and affect are normal. Speech and behavior are normal.  ED Results / Procedures / Treatments    Labs (all labs ordered are listed, but only abnormal results are displayed) Labs Reviewed  COMPREHENSIVE METABOLIC PANEL - Abnormal; Notable for the following components:      Result Value   Calcium 8.5 (*)    Total Protein 6.1 (*)    ALT 48 (*)    All other components within normal limits  CBC WITH DIFFERENTIAL/PLATELET - Abnormal; Notable for the following components:   RBC 3.95 (*)    HCT 38.3 (*)    MCH 34.4 (*)    All other components within normal limits  LIPASE, BLOOD  TROPONIN I (HIGH SENSITIVITY)    EKG EKG Interpretation  Date/Time:  Friday April 04 2022 22:41:37 EST Ventricular Rate:  72 PR Interval:  132 QRS Duration: 94 QT Interval:  392 QTC Calculation: 429 R Axis:   59 Text Interpretation: Sinus rhythm Abnormal R-wave progression, early transition No significant change since last tracing Confirmed by Georgina Snell [phone]) on 04/04/2022 10:49:20 PM  Radiology DG Chest Portable 1 View  Result Date: 04/04/2022 CLINICAL DATA:  Chest pain. EXAM: PORTABLE CHEST 1 VIEW COMPARISON:  Chest radiograph dated 12/28/2021. FINDINGS: No focal consolidation, pleural effusion, or pneumothorax the the cardiac silhouette is within limits. Atherosclerotic calcification of the aortic arch. No acute osseous pathology. IMPRESSION: No active disease. Electronically Signed   By: Anner Crete M.D.   On: 04/04/2022 23:16    Procedures Procedures  Remain on constant cardiac monitoring sinus rhythm with normal rates.  Medications Ordered in ED Medications  acetaminophen (TYLENOL) tablet 1,000 mg (1,000 mg Oral Given 04/04/22 2256)    ED Course/ Medical Decision Making/ A&P Clinical Course as of 04/04/22 2334  Fri Apr 04, 2022  2311 Patient complaining of persistent chest pain repeat EKG obtained with no acute changes. [VB]    Clinical Course User Index [VB] Elgie Congo, MD   {                            Medical Decision Making  Connor Baker is a 62 y.o.  male.  With PMH of CAD and status post stent, CVA, CHF who presents with ongoing right-sided nonradiating chest pain since approximately 10 PM tonight.  Given 324 mg aspirin and 4 times nitroglycerin by EMS without relief.  Patient reports chest pain that sounds nonspecific in nature and is well-appearing without any acute changes on his EKG.  Initial high sensitive troponin is 4 and reassuring.  Due to time of onset, plan to repeat troponin and determine disposition. HEART score 4.  Chest x-ray obtained which I personally reviewed no pneumonia, no pneumothorax, no pulmonary edema.  He has no abdominal pain and normal lipase with  only slightly elevated ALT, doubt intra-abdominal pathology.  I have no concern for dissection based off well appearance, no neurologic or pulse deficits.  Suspect some possible secondary gain as patient currently incarcerated.  No signs of DVT on exam but with associated shortness of breath ordered for CTA PE study.  Signed out to *** pending rpt troponin and CTAPE study and reassessment.    Amount and/or Complexity of Data Reviewed Labs: ordered. Radiology: ordered.  Risk OTC drugs.   Final Clinical Impression(s) / ED Diagnoses Final diagnoses:  Atypical chest pain    Rx / DC Orders ED Discharge Orders     None

## 2022-04-04 NOTE — ED Triage Notes (Signed)
Pt brought in by EMS c/o chest pain and shortness of breath that started approximately 45 minutes ago. Hx of stemi and blood clots.

## 2022-04-04 NOTE — Discharge Instructions (Signed)
You were seen in the emergency department today for chest pain. You workup did not reveal a definite cause of your symptoms but was generally reassuring.   Return to the emergency department immediately if you develop recurrent, severe chest pain, shortness of breath, fainting spells, sudden sweatiness, or any other concerning symptoms.   Please also make an appointment to follow up with your primary care doctor or cardiologist within one week to assure improvement or resolution in symptoms. Further testing may be necessary, so it is extremely important to keep your follow-up appointment with your primary doctor.   

## 2022-04-05 ENCOUNTER — Encounter (HOSPITAL_COMMUNITY): Payer: Self-pay | Admitting: Internal Medicine

## 2022-04-05 ENCOUNTER — Emergency Department (HOSPITAL_COMMUNITY): Payer: Medicaid Other

## 2022-04-05 ENCOUNTER — Observation Stay (HOSPITAL_COMMUNITY): Payer: Medicaid Other

## 2022-04-05 DIAGNOSIS — E782 Mixed hyperlipidemia: Secondary | ICD-10-CM

## 2022-04-05 DIAGNOSIS — J9811 Atelectasis: Secondary | ICD-10-CM | POA: Diagnosis not present

## 2022-04-05 DIAGNOSIS — R079 Chest pain, unspecified: Secondary | ICD-10-CM | POA: Diagnosis not present

## 2022-04-05 DIAGNOSIS — J309 Allergic rhinitis, unspecified: Secondary | ICD-10-CM | POA: Diagnosis present

## 2022-04-05 DIAGNOSIS — I200 Unstable angina: Secondary | ICD-10-CM

## 2022-04-05 DIAGNOSIS — J301 Allergic rhinitis due to pollen: Secondary | ICD-10-CM | POA: Diagnosis not present

## 2022-04-05 DIAGNOSIS — K219 Gastro-esophageal reflux disease without esophagitis: Secondary | ICD-10-CM | POA: Diagnosis not present

## 2022-04-05 DIAGNOSIS — G4733 Obstructive sleep apnea (adult) (pediatric): Secondary | ICD-10-CM

## 2022-04-05 DIAGNOSIS — Z87898 Personal history of other specified conditions: Secondary | ICD-10-CM

## 2022-04-05 HISTORY — DX: Personal history of other specified conditions: Z87.898

## 2022-04-05 LAB — CBC WITH DIFFERENTIAL/PLATELET
Abs Immature Granulocytes: 0.05 10*3/uL (ref 0.00–0.07)
Basophils Absolute: 0.1 10*3/uL (ref 0.0–0.1)
Basophils Relative: 1 %
Eosinophils Absolute: 0.3 10*3/uL (ref 0.0–0.5)
Eosinophils Relative: 3 %
HCT: 39.1 % (ref 39.0–52.0)
Hemoglobin: 14 g/dL (ref 13.0–17.0)
Immature Granulocytes: 1 %
Lymphocytes Relative: 42 %
Lymphs Abs: 3.5 10*3/uL (ref 0.7–4.0)
MCH: 34.8 pg — ABNORMAL HIGH (ref 26.0–34.0)
MCHC: 35.8 g/dL (ref 30.0–36.0)
MCV: 97.3 fL (ref 80.0–100.0)
Monocytes Absolute: 0.9 10*3/uL (ref 0.1–1.0)
Monocytes Relative: 11 %
Neutro Abs: 3.4 10*3/uL (ref 1.7–7.7)
Neutrophils Relative %: 42 %
Platelets: 181 10*3/uL (ref 150–400)
RBC: 4.02 MIL/uL — ABNORMAL LOW (ref 4.22–5.81)
RDW: 13.1 % (ref 11.5–15.5)
WBC: 8.1 10*3/uL (ref 4.0–10.5)
nRBC: 0 % (ref 0.0–0.2)

## 2022-04-05 LAB — ECHOCARDIOGRAM COMPLETE
AR max vel: 2.68 cm2
AV Area VTI: 2.94 cm2
AV Area mean vel: 3.04 cm2
AV Mean grad: 3 mmHg
AV Peak grad: 4.8 mmHg
Ao pk vel: 1.1 m/s
Area-P 1/2: 2.95 cm2
Height: 70 in
S' Lateral: 3.8 cm
Weight: 3150.4 oz

## 2022-04-05 LAB — COMPREHENSIVE METABOLIC PANEL
ALT: 51 U/L — ABNORMAL HIGH (ref 0–44)
AST: 32 U/L (ref 15–41)
Albumin: 3.7 g/dL (ref 3.5–5.0)
Alkaline Phosphatase: 60 U/L (ref 38–126)
Anion gap: 8 (ref 5–15)
BUN: 8 mg/dL (ref 8–23)
CO2: 26 mmol/L (ref 22–32)
Calcium: 8.7 mg/dL — ABNORMAL LOW (ref 8.9–10.3)
Chloride: 105 mmol/L (ref 98–111)
Creatinine, Ser: 0.87 mg/dL (ref 0.61–1.24)
GFR, Estimated: >60 mL/min (ref >60)
Glucose, Bld: 89 mg/dL (ref 70–99)
Potassium: 3.5 mmol/L (ref 3.5–5.1)
Sodium: 139 mmol/L (ref 135–145)
Total Bilirubin: 1.1 mg/dL (ref 0.3–1.2)
Total Protein: 6.6 g/dL (ref 6.5–8.1)

## 2022-04-05 LAB — TROPONIN I (HIGH SENSITIVITY)
Troponin I (High Sensitivity): 4 ng/L (ref <18)
Troponin I (High Sensitivity): 5 ng/L (ref <18)

## 2022-04-05 LAB — MAGNESIUM: Magnesium: 2.1 mg/dL (ref 1.7–2.4)

## 2022-04-05 MED ORDER — MORPHINE SULFATE (PF) 2 MG/ML IV SOLN
2.0000 mg | Freq: Once | INTRAVENOUS | Status: AC
  Administered 2022-04-05: 2 mg via INTRAVENOUS
  Filled 2022-04-05: qty 1

## 2022-04-05 MED ORDER — MORPHINE SULFATE (PF) 4 MG/ML IV SOLN
3.0000 mg | INTRAVENOUS | Status: DC | PRN
  Administered 2022-04-05 – 2022-04-07 (×14): 3 mg via INTRAVENOUS
  Filled 2022-04-05 (×15): qty 1

## 2022-04-05 MED ORDER — ATORVASTATIN CALCIUM 80 MG PO TABS
80.0000 mg | ORAL_TABLET | Freq: Every day | ORAL | Status: DC
  Administered 2022-04-05 – 2022-04-08 (×4): 80 mg via ORAL
  Filled 2022-04-05 (×3): qty 1
  Filled 2022-04-05: qty 2

## 2022-04-05 MED ORDER — PANTOPRAZOLE SODIUM 40 MG IV SOLR
40.0000 mg | INTRAVENOUS | Status: DC
  Administered 2022-04-05: 40 mg via INTRAVENOUS
  Filled 2022-04-05: qty 10

## 2022-04-05 MED ORDER — POTASSIUM CHLORIDE CRYS ER 20 MEQ PO TBCR
40.0000 meq | EXTENDED_RELEASE_TABLET | Freq: Once | ORAL | Status: AC
  Administered 2022-04-05: 40 meq via ORAL
  Filled 2022-04-05: qty 2

## 2022-04-05 MED ORDER — ACETAMINOPHEN 650 MG RE SUPP: 650.0000 mg | Freq: Four times a day (QID) | RECTAL | Status: DC | PRN

## 2022-04-05 MED ORDER — RANOLAZINE ER 500 MG PO TB12
500.0000 mg | ORAL_TABLET | Freq: Two times a day (BID) | ORAL | Status: DC
  Administered 2022-04-05 – 2022-04-07 (×6): 500 mg via ORAL
  Filled 2022-04-05 (×6): qty 1

## 2022-04-05 MED ORDER — LEVETIRACETAM 500 MG PO TABS
500.0000 mg | ORAL_TABLET | Freq: Two times a day (BID) | ORAL | Status: DC
  Administered 2022-04-05 – 2022-04-08 (×7): 500 mg via ORAL
  Filled 2022-04-05 (×7): qty 1

## 2022-04-05 MED ORDER — CLOPIDOGREL BISULFATE 75 MG PO TABS
75.0000 mg | ORAL_TABLET | Freq: Every day | ORAL | Status: DC
  Administered 2022-04-05 – 2022-04-08 (×4): 75 mg via ORAL
  Filled 2022-04-05 (×5): qty 1

## 2022-04-05 MED ORDER — ASPIRIN 325 MG PO TABS
325.0000 mg | ORAL_TABLET | Freq: Once | ORAL | Status: AC
  Administered 2022-04-05: 325 mg via ORAL
  Filled 2022-04-05: qty 1

## 2022-04-05 MED ORDER — NALOXONE HCL 0.4 MG/ML IJ SOLN: 0.4000 mg | INTRAMUSCULAR | Status: DC | PRN

## 2022-04-05 MED ORDER — ACETAMINOPHEN 325 MG PO TABS: 650.0000 mg | ORAL_TABLET | Freq: Four times a day (QID) | ORAL | Status: DC | PRN

## 2022-04-05 MED ORDER — SODIUM CHLORIDE 0.9% FLUSH
3.0000 mL | Freq: Two times a day (BID) | INTRAVENOUS | Status: DC
  Administered 2022-04-05 – 2022-04-06 (×3): 3 mL via INTRAVENOUS

## 2022-04-05 MED ORDER — PANTOPRAZOLE SODIUM 40 MG PO TBEC
40.0000 mg | DELAYED_RELEASE_TABLET | Freq: Every day | ORAL | Status: DC
  Administered 2022-04-06 – 2022-04-08 (×3): 40 mg via ORAL
  Filled 2022-04-05 (×3): qty 1

## 2022-04-05 MED ORDER — IOHEXOL 350 MG/ML SOLN
75.0000 mL | Freq: Once | INTRAVENOUS | Status: AC | PRN
  Administered 2022-04-05: 75 mL via INTRAVENOUS

## 2022-04-05 MED ORDER — ASPIRIN 81 MG PO TBEC
81.0000 mg | DELAYED_RELEASE_TABLET | Freq: Every day | ORAL | Status: DC
  Administered 2022-04-06 – 2022-04-07 (×2): 81 mg via ORAL
  Filled 2022-04-05 (×2): qty 1

## 2022-04-05 MED ORDER — PERFLUTREN LIPID MICROSPHERE
1.0000 mL | INTRAVENOUS | Status: AC | PRN
  Administered 2022-04-05: 2 mL via INTRAVENOUS

## 2022-04-05 MED ORDER — MELATONIN 3 MG PO TABS
3.0000 mg | ORAL_TABLET | Freq: Every evening | ORAL | Status: DC | PRN
  Administered 2022-04-07: 3 mg via ORAL
  Filled 2022-04-05: qty 1

## 2022-04-05 NOTE — ED Notes (Signed)
Report given to Alex, RN

## 2022-04-05 NOTE — Consult Note (Signed)
Cardiology Consultation   Patient ID: Connor Baker MRN: [id number]; DOB: Jul 10, 1960  Admit date: 04/04/2022 Date of Consult: 04/05/2022  PCP:  [facility] Cardiologist:  Lauree Chandler, MD   {    Patient Profile:   Connor Baker is a 61 y.o. male with a hx of PCI and multiple coronary stents who is being seen 04/05/2022 for the evaluation of chest pain at the request of Dr. Tana Coast.  History of Present Illness:   Connor Baker has a history of chest discomfort.  He had prior coronary disease with multiple stents at multiple institutions.  I did look through our records and through [facility].   He was last here in March 2022 with chest pain.  He had no objective evidence of ischemia and it was decided to manage him conservatively and he wanted to follow-up as an outpatient.  I do see that he was a no-show for follow-up and I do not see that he has been seen since 2022.  His last catheterization was 2018.  That result is listed below.  He presents now with chest discomfort.  Enzymes have been negative. Echocardiogram done this admission demonstrated well-preserved ejection fraction.  CT demonstrated no acute abnormalities.  EKG was without acute changes.  He reports that he been feeling okay.  He does get some shortness of breath and has a sensation of something going from his leg up to his lungs occasionally.  What brought him to the hospital was the day before he developed chest discomfort.  He has been at a facility incarcerated for the last 90 days.  For 4 days he was moved to a different facility awaiting a court hearing and is without any of his medications.  He said he developed 10 out of 10 chest discomfort.  He said there was some associated nausea and shortness of breath and sweating.  It was in his chest.  He really did not seem to radiate to his back or to his arms.  He did get nitroglycerin and required 3 without complete resolution of  his pain.  He says that the morphine he has been getting since admission has helped him although his pain still goes only 5 out of 10.    Past Medical History:  Diagnosis Date   Arthritis    "legs" (08/28/2016)   Chronic lower back pain    CVA (cerebral vascular accident) ([place])    Depression    GERD (gastroesophageal reflux disease)    High cholesterol    Hypertension    MI (myocardial infarction) ([place]) 1992; 1994; 1995   MI (myocardial infarction) ([place])    [phone]   On home oxygen therapy    "4L just at night w/my CPAP" (08/28/2016)   OSA (obstructive sleep apnea)    w/oxygen" (08/28/2016)   Stroke ([place]) 2005   "mild one; faded my memory" (08/28/2016)    Past Surgical History:  Procedure Laterality Date   ANKLE FRACTURE SURGERY Right 2016   [facility] - 2017   "I've got a total of 16 stents in me" (08/28/2016)   CORONARY BALLOON ANGIOPLASTY Right 08/28/2016   Procedure: Coronary Balloon Angioplasty;  Surgeon: Belva Crome, MD;  Location: [facility];  Service: Cardiovascular;  Laterality: Right;   FRACTURE SURGERY     LEFT HEART CATH AND CORONARY ANGIOGRAPHY N/A 08/28/2016   Procedure: Left Heart  Cath and Coronary Angiography;  Surgeon: Belva Crome, MD;  Location: [facility];  Service: Cardiovascular;  Laterality: N/A;   LEFT HEART CATH AND CORONARY ANGIOGRAPHY N/A 10/24/2016   Procedure: LEFT HEART CATH AND CORONARY ANGIOGRAPHY;  Surgeon: Martinique, Peter M, MD;  Location: [facility];  Service: Cardiovascular;  Laterality: N/A;   TOTAL HIP ARTHROPLASTY Right 1990       Inpatient Medications: Scheduled Meds:  [START ON 04/06/2022] aspirin EC  81 mg Oral Daily   atorvastatin  80 mg Oral Daily   clopidogrel  75 mg Oral Daily   levETIRAcetam  500 mg Oral BID   [START ON 04/06/2022] pantoprazole  40 mg Oral Daily   ranolazine  500 mg Oral BID   Continuous Infusions:  PRN  Meds: acetaminophen **OR** acetaminophen, melatonin, morphine injection, naLOXone (NARCAN)  injection  Allergies:   No Known Allergies  Social History:   Social History   Socioeconomic History   Marital status: Single    Spouse name: Not on file   Number of children: Not on file   Years of education: Not on file   Highest education level: Not on file  Occupational History   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     Social Determinants of Health   Financial Resource Strain: High Risk (10/11/2017)   Overall Financial Resource Strain (CARDIA)    Difficulty of Paying Living Expenses: Hard  Food Insecurity: Not on file  Transportation Needs: Not on file  Physical Activity: Not on file  Stress: Not on file  Social Connections: Not on file  Intimate Partner Violence: Not on file    Family History:    Family History  Problem Relation Age of Onset   Diabetes Mother    Heart attack Father 35     ROS:  Please see the history of present illness.   All other ROS reviewed and negative.     Physical Exam/Data:   Vitals:   04/05/22 0715 04/05/22 0800 04/05/22 0823 04/05/22 1156  BP: 121/89 126/82  106/76  Pulse: (!) 54 (!) 55  (!) 53  Resp: 17 15 16 16  $ Temp:   97.6 F (36.4 C) 97.6 F (36.4 C)  TempSrc:   Oral Oral  SpO2: 96% 96%  94%  Weight:   89.3 kg   Height:   5' 10"$  (1.778 m)     Intake/Output Summary (Last 24 hours) at 04/05/2022 1555 Last data filed at 04/04/2022 2241 Gross per 24 hour   Intake 500 ml  Output --  Net 500 ml      04/05/2022    8:23 AM 04/04/2022   10:47 PM 12/28/2021    1:40 AM  Last 3 Weights  Weight (lbs) 196 lb 14.4 oz 196 lb 210 lb 1.6 oz  Weight (kg) 89.313 kg 88.905 kg 95.3 kg  Body mass index is 28.25 kg/m.  GENERAL:  Well appearing HEENT:  Pupils equal round and reactive, fundi not visualized, oral mucosa unremarkable NECK:  No jugular venous distention, waveform within normal limits, carotid upstroke brisk and symmetric, no bruits, no thyromegaly LYMPHATICS:  No cervical, inguinal adenopathy LUNGS:  Clear to auscultation bilaterally BACK:  No CVA tenderness CHEST:  Unremarkable HEART:  PMI not displaced or sustained,S1 and S2 within normal limits, no S3, no S4, no clicks, no rubs, no murmurs ABD:  Flat, positive bowel sounds normal in frequency in pitch, no bruits, no rebound, no guarding, no midline pulsatile mass, no hepatomegaly, no splenomegaly EXT:  2 plus pulses throughout, no edema, no cyanosis no clubbing SKIN:  No rashes no nodules NEURO:  Cranial nerves II through XII grossly intact, motor grossly intact throughout PSYCH:  Cognitively intact, oriented to person place and time   EKG:  The EKG was personally reviewed and demonstrates: Normal sinus rhythm, rate 71, axis within normal limits, intervals within normal limits, no acute ST-T wave changes. Telemetry:  Telemetry was personally reviewed and demonstrates:  NSR  Relevant CV Studies:  Cath 2018  Diagnostic Dominance: Right  Intervention   Laboratory Data:  High Sensitivity Troponin:   Recent Labs  Lab 04/04/22 2252 04/05/22 0230 04/05/22 0927  TROPONINIHS 4 4 5     $ Chemistry Recent Labs  Lab 04/04/22 2252 04/05/22 0549  NA 138 139  K 3.9 3.5  CL 107 105  CO2 23 26  GLUCOSE 97 89  BUN 10 8  CREATININE 0.87 0.87  CALCIUM 8.5* 8.7*  MG  --  2.1  GFRNONAA >60 >60  ANIONGAP 8 8    Recent Labs  Lab 04/04/22 2252 04/05/22 0549  PROT 6.1* 6.6   ALBUMIN 3.5 3.7  AST 29 32  ALT 48* 51*  ALKPHOS 56 60  BILITOT 0.6 1.1   Lipids No results for input(s): "CHOL", "TRIG", "HDL", "LABVLDL", "LDLCALC", "CHOLHDL" in the last 168 hours.  Hematology Recent Labs  Lab 04/04/22 2252 04/05/22 0549  WBC 7.5 8.1  RBC 3.95* 4.02*  HGB 13.6 14.0  HCT 38.3* 39.1  MCV 97.0 97.3  MCH 34.4* 34.8*  MCHC 35.5 35.8  RDW 12.8 13.1  PLT 191 181   Thyroid No results for input(s): "TSH", "FREET4" in the last 168 hours.  BNPNo results for input(s): "BNP", "PROBNP" in the last 168 hours.  DDimer No results for input(s): "DDIMER" in the last 168 hours.   Radiology/Studies:  ECHOCARDIOGRAM COMPLETE  Result Date: 04/05/2022    ECHOCARDIOGRAM REPORT   Patient Name:   Connor Baker Date of Exam: 04/05/2022 Medical Rec #:  [id number]     Height:       70.0 in Accession #:    [id number]    Weight:       196.9 lb Date of Birth:  [birth year]     BSA:          2.074 m Patient Age:    80 years      BP:           105/70 mmHg Patient Gender: M             HR:           54 bpm. Exam Location:  Inpatient Procedure: 2D Echo, Cardiac Doppler, Color Doppler and Intracardiac            Opacification Agent Indications:    R07.9* Chest pain, unspecified  History:  Patient has prior history of Echocardiogram examinations, most                 recent 05/15/2020. CHF, CAD and Previous Myocardial Infarction;                 Risk Factors:Sleep Apnea, Current Smoker and Dyslipidemia.  Sonographer:    Wilkie Aye RVT RCS Referring Phys: [id number] Rhetta Mura  Sonographer Comments: Suboptimal parasternal window and suboptimal subcostal window. IMPRESSIONS  1. Left ventricular ejection fraction, by estimation, is 60 to 65%. The left ventricle has normal function. The left ventricle has no regional wall motion abnormalities. There is mild left ventricular hypertrophy. Left ventricular diastolic parameters are consistent with Grade I diastolic dysfunction (impaired relaxation).  2.  Right ventricular systolic function is normal. The right ventricular size is normal. Tricuspid regurgitation signal is inadequate for assessing PA pressure.  3. The mitral valve is normal in structure. Trivial mitral valve regurgitation. No evidence of mitral stenosis.  4. The aortic valve is tricuspid. There is mild calcification of the aortic valve. Aortic valve regurgitation is not visualized. No aortic stenosis is present.  5. The inferior vena cava is normal in size with greater than 50% respiratory variability, suggesting right atrial pressure of 3 mmHg. FINDINGS  Left Ventricle: Left ventricular ejection fraction, by estimation, is 60 to 65%. The left ventricle has normal function. The left ventricle has no regional wall motion abnormalities. Definity contrast agent was given IV to delineate the left ventricular  endocardial borders. The left ventricular internal cavity size was normal in size. There is mild left ventricular hypertrophy. Left ventricular diastolic parameters are consistent with Grade I diastolic dysfunction (impaired relaxation). Right Ventricle: The right ventricular size is normal. No increase in right ventricular wall thickness. Right ventricular systolic function is normal. Tricuspid regurgitation signal is inadequate for assessing PA pressure. The tricuspid regurgitant velocity is 1.51 m/s, and with an assumed right atrial pressure of 3 mmHg, the estimated right ventricular systolic pressure is [id number] mmHg. Left Atrium: Left atrial size was normal in size. Right Atrium: Right atrial size was normal in size. Pericardium: There is no evidence of pericardial effusion. Mitral Valve: The mitral valve is normal in structure. Trivial mitral valve regurgitation. No evidence of mitral valve stenosis. Tricuspid Valve: The tricuspid valve is normal in structure. Tricuspid valve regurgitation is trivial. No evidence of tricuspid stenosis. Aortic Valve: The aortic valve is tricuspid. There is mild  calcification of the aortic valve. Aortic valve regurgitation is not visualized. No aortic stenosis is present. Aortic valve mean gradient measures 3.0 mmHg. Aortic valve peak gradient measures 4.8 mmHg. Aortic valve area, by VTI measures 2.94 cm. Pulmonic Valve: The pulmonic valve was normal in structure. Pulmonic valve regurgitation is not visualized. No evidence of pulmonic stenosis. Aorta: The ascending aorta was not well visualized and the aortic root is normal in size and structure. Venous: The inferior vena cava is normal in size with greater than 50% respiratory variability, suggesting right atrial pressure of 3 mmHg. IAS/Shunts: No atrial level shunt detected by color flow Doppler.  LEFT VENTRICLE PLAX 2D LVIDd:         4.80 cm   Diastology LVIDs:         3.80 cm   LV e' medial:    5.17 cm/s LV PW:         0.90 cm   LV E/e' medial:  10.4 LV IVS:        1.20 cm  LV e' lateral:   7.45 cm/s LVOT diam:     2.10 cm   LV E/e' lateral: 7.2 LV SV:         72 LV SV Index:   35 LVOT Area:     3.46 cm  RIGHT VENTRICLE RV Basal diam:  3.00 cm RV S prime:     13.80 cm/s TAPSE (M-mode): 2.0 cm LEFT ATRIUM             Index        RIGHT ATRIUM          Index LA diam:        4.30 cm 2.07 cm/m   RA Area:     9.60 cm LA Vol (A2C):   41.6 ml 20.06 ml/m  RA Volume:   18.60 ml 8.97 ml/m LA Vol (A4C):   44.3 ml 21.36 ml/m LA Biplane Vol: 45.4 ml 21.90 ml/m  AORTIC VALVE                    PULMONIC VALVE AV Area (Vmax):    2.68 cm     PV Vmax:       0.92 m/s AV Area (Vmean):   3.04 cm     PV Peak grad:  3.4 mmHg AV Area (VTI):     2.94 cm AV Vmax:           110.00 cm/s AV Vmean:          75.400 cm/s AV VTI:            0.244 m AV Peak Grad:      4.8 mmHg AV Mean Grad:      3.0 mmHg LVOT Vmax:         85.00 cm/s LVOT Vmean:        66.100 cm/s LVOT VTI:          0.207 m LVOT/AV VTI ratio: 0.85  AORTA Ao Root diam: 3.30 cm MITRAL VALVE               TRICUSPID VALVE MV Area (PHT): 2.95 cm    TR Peak grad:   9.1 mmHg MV  Decel Time: 257 msec    TR Vmax:        151.00 cm/s MV E velocity: 54.00 cm/s MV A velocity: 68.10 cm/s  SHUNTS MV E/A ratio:  0.79        Systemic VTI:  0.21 m                            Systemic Diam: 2.10 cm Cherlynn Kaiser MD Electronically signed by Cherlynn Kaiser MD Signature Date/Time: 04/05/2022/2:23:45 PM    Final    CT Angio Chest PE W and/or Wo Contrast  Result Date: 04/05/2022 CLINICAL DATA:  History of DVT with right chest pain EXAM: CT ANGIOGRAPHY CHEST WITH CONTRAST TECHNIQUE: Multidetector CT imaging of the chest was performed using the standard protocol during bolus administration of intravenous contrast. Multiplanar CT image reconstructions and MIPs were obtained to evaluate the vascular anatomy. RADIATION DOSE REDUCTION: This exam was performed according to the departmental dose-optimization program which includes automated exposure control, adjustment of the mA and/or kV according to patient size and/or use of iterative reconstruction technique. CONTRAST:  28m OMNIPAQUE IOHEXOL 350 MG/ML SOLN COMPARISON:  07/21/2020 FINDINGS: Cardiovascular: No filling defects in the pulmonary arteries to suggest pulmonary emboli. Heart is normal size. Aorta is normal caliber. Diffuse  coronary artery calcifications and scattered aortic calcifications. Mediastinum/Nodes: No mediastinal, hilar, or axillary adenopathy. Trachea and esophagus are unremarkable. Thyroid unremarkable. Lungs/Pleura: Dependent atelectasis in the lungs.  No effusions. Upper Abdomen: No acute findings Musculoskeletal: Chest wall soft tissues are unremarkable. No acute bony abnormality. Review of the MIP images confirms the above findings. IMPRESSION: No evidence of pulmonary embolus. Diffuse coronary artery disease. No acute cardiopulmonary disease. Aortic Atherosclerosis (ICD10-I70.0). Electronically Signed   By: Rolm Baptise M.D.   On: 04/05/2022 00:37   DG Chest Portable 1 View  Result Date: 04/04/2022 CLINICAL DATA:  Chest pain.  EXAM: PORTABLE CHEST 1 VIEW COMPARISON:  Chest radiograph dated 12/28/2021. FINDINGS: No focal consolidation, pleural effusion, or pneumothorax the the cardiac silhouette is within limits. Atherosclerotic calcification of the aortic arch. No acute osseous pathology. IMPRESSION: No active disease. Electronically Signed   By: Anner Crete M.D.   On: 04/04/2022 23:16     Assessment and Plan:    CHEST PAIN: Possible unstable angina.  There is no objective evidence of ischemia but this would be difficult to sort out without cardiac cath. The patient understands that risks included but are not limited to stroke (1 in 1000), death (1 in 5), kidney failure [usually temporary] (1 in 500), bleeding (1 in 200), allergic reaction [possibly serious] (1 in 200).  The patient understands and agrees to proceed.     Risk reduction:  Check lipid profile.  Continue current statin for now.        For questions or updates, please contact [facility] Please consult [email or web address] for contact info under    Signed, Minus Breeding, MD  04/05/2022 3:55 PM

## 2022-04-05 NOTE — Progress Notes (Signed)
[facility]                                                                              Connor Baker, is a 62 y.o. male, DOB - 1960-11-09, [id number] Admit date - 04/04/2022    Outpatient Primary [facility] for the [facility] is [facility]  LOS - 0  days  Chief Complaint  Patient presents with   Chest Pain   Shortness of Breath       Brief summary   Patient is a 62 year old male with CAD status post stents, chronic angina on Ranexa, GERD, HLP, OSA on nocturnal CPAP, seizure disorder presented with chest pain.  Patient reports an extensive cardiac history including CAD status post PCI with several stents, undergone coronary angiography in 09/2016.  He is on dual antiplatelet therapy, Ranexa for chronic angina. Patient presented from prison and reported that he recently transferred facilities 4 days ago and since the time of his transfer he has not been able to receive his cardiac medications.  Presented with 2 days of substernal nonradiating chest pressure, nonexertional, nonpositional and nonpleuritic, now constant over last 48 hours with mild shortness of breath, dizziness. Previous echo in 04/2020 showed EF of 60 to 65%, no focal WMA, normal right ventricular systolic function. Troponins negative in ED, chest x-ray showed no evidence of acute cardiopulmonary process.  CTA showed no acute PE or infiltrate/edema or pneumothorax. EKG showed sinus rhythm rate 71, no evidence of acute ST-T wave changes suggestive of ischemia. EDP had discussed with on-call cardiology fellow overnight.  Assessment & Plan    Principal Problem:   Chest pain with underlying history of extensive CAD, PCI, chronic angina -EKG with no acute ST-T wave changes suggestive of ischemia, troponins x 2 negative -CTA showed no acute PE or infiltrates, pulmonary edema or pneumothorax -2D echo pending -Patient continues to complain of chest pain currently 4-5/10, resumed aspirin, Plavix, Lipitor, Ranexa.   - BP currently soft, unable to add Imdur. -Will consult cardiology   Active Problems:   HLD (hyperlipidemia) -Obtain lipid panel in a.m., continue statin    GERD (gastroesophageal reflux disease) -Continue Protonix    OSA (obstructive sleep apnea) -Continue CPAP qhs    History of seizures -Continue Keppra   Code Status: Full CODE STATUS DVT Prophylaxis:  SCDs Start: 04/05/22 0515   Level of Care: Level of care: Telemetry Cardiac Family Communication: Updated patient Disposition Plan:      Remains inpatient appropriate: Follow 2D echo, still ongoing chest pain, cardiology consulted will await recommendations   Procedures:    Consultants:   Cardiology  Antimicrobials: None    Medications  [START ON 04/06/2022] aspirin EC  81 mg Oral Daily   atorvastatin  80 mg Oral Daily   clopidogrel  75 mg Oral Daily   levETIRAcetam  500 mg Oral BID   pantoprazole (PROTONIX) IV  40 mg Intravenous Q24H   ranolazine  500 mg Oral BID      Subjective:   Connor Baker was seen and examined today.  States still having chest pain, 4-5/10, nonpleuritic, nonexertional and no chest wall tenderness.  Denies any dizziness, acute  shortness of breath, abdominal pain nausea vomiting.   Objective:   Vitals:   04/05/22 0715 04/05/22 0800 04/05/22 0823 04/05/22 1156  BP: 121/89 126/82  106/76  Pulse: (!) 54 (!) 55  (!) 53  Resp: 17 15 16 16  $ Temp:   97.6 F (36.4 C) 97.6 F (36.4 C)  TempSrc:   Oral Oral  SpO2: 96% 96%  94%  Weight:   89.3 kg   Height:   5' 10"$  (1.778 m)     Intake/Output Summary (Last 24 hours) at 04/05/2022 1249 Last data filed at 04/04/2022 2241 Gross per 24 hour  Intake 500 ml  Output --  Net 500 ml     Wt Readings from Last 3 Encounters:  04/05/22 89.3 kg  12/28/21 95.3 kg  11/26/20 95.3 kg     Exam General: Alert and oriented x 3, NAD Cardiovascular: S1 S2 auscultated,  RRR Respiratory: Clear to auscultation bilaterally, no wheezing,  rales. Gastrointestinal: Soft, nontender, nondistended, + bowel sounds Ext: no pedal edema bilaterally Neuro: Strength 5/5 upper and lower extremities bilaterally Skin: No rashes Psych: Normal affect     Data Reviewed:  I have personally reviewed following labs    CBC Lab Results  Component Value Date   WBC 8.1 04/05/2022   RBC 4.02 (L) 04/05/2022   HGB 14.0 04/05/2022   HCT 39.1 04/05/2022   MCV 97.3 04/05/2022   MCH 34.8 (H) 04/05/2022   PLT 181 04/05/2022   MCHC 35.8 04/05/2022   RDW 13.1 04/05/2022   LYMPHSABS 3.5 04/05/2022   MONOABS 0.9 04/05/2022   EOSABS 0.3 04/05/2022   BASOSABS 0.1 [id number]     Last metabolic panel Lab Results  Component Value Date   NA 139 04/05/2022   K 3.5 04/05/2022   CL 105 04/05/2022   CO2 26 04/05/2022   BUN 8 04/05/2022   CREATININE 0.87 04/05/2022   GLUCOSE 89 04/05/2022   GFRNONAA >60 04/05/2022   GFRAA >60 10/12/2019   CALCIUM 8.7 (L) 04/05/2022   PROT 6.6 04/05/2022   ALBUMIN 3.7 04/05/2022   BILITOT 1.1 04/05/2022   ALKPHOS 60 04/05/2022   AST 32 04/05/2022   ALT 51 (H) 04/05/2022   ANIONGAP 8 04/05/2022    CBG (last 3)  No results for input(s): "GLUCAP" in the last 72 hours.    Coagulation Profile: No results for input(s): "INR", "PROTIME" in the last 168 hours.   Radiology Studies: I have personally reviewed the imaging studies  CT Angio Chest PE W and/or Wo Contrast  Result Date: 04/05/2022 CLINICAL DATA:  History of DVT with right chest pain EXAM: CT ANGIOGRAPHY CHEST WITH CONTRAST TECHNIQUE: Multidetector CT imaging of the chest was performed using the standard protocol during bolus administration of intravenous contrast. Multiplanar CT image reconstructions and MIPs were obtained to evaluate the vascular anatomy. RADIATION DOSE REDUCTION: This exam was performed according to the departmental dose-optimization program which includes automated exposure control, adjustment of the mA and/or kV according to  patient size and/or use of iterative reconstruction technique. CONTRAST:  35m OMNIPAQUE IOHEXOL 350 MG/ML SOLN COMPARISON:  07/21/2020 FINDINGS: Cardiovascular: No filling defects in the pulmonary arteries to suggest pulmonary emboli. Heart is normal size. Aorta is normal caliber. Diffuse coronary artery calcifications and scattered aortic calcifications. Mediastinum/Nodes: No mediastinal, hilar, or axillary adenopathy. Trachea and esophagus are unremarkable. Thyroid unremarkable. Lungs/Pleura: Dependent atelectasis in the lungs.  No effusions. Upper Abdomen: No acute findings Musculoskeletal: Chest wall soft tissues are unremarkable. No acute bony abnormality.  Review of the MIP images confirms the above findings. IMPRESSION: No evidence of pulmonary embolus. Diffuse coronary artery disease. No acute cardiopulmonary disease. Aortic Atherosclerosis (ICD10-I70.0). Electronically Signed   By: Rolm Baptise M.D.   On: 04/05/2022 00:37   DG Chest Portable 1 View  Result Date: 04/04/2022 CLINICAL DATA:  Chest pain. EXAM: PORTABLE CHEST 1 VIEW COMPARISON:  Chest radiograph dated 12/28/2021. FINDINGS: No focal consolidation, pleural effusion, or pneumothorax the the cardiac silhouette is within limits. Atherosclerotic calcification of the aortic arch. No acute osseous pathology. IMPRESSION: No active disease. Electronically Signed   By: Anner Crete M.D.   On: 04/04/2022 23:16       Laurel Harnden M.D. [facility] 04/05/2022, 12:49 PM  Available via Epic secure chat 7am-7pm After 7 pm, please refer to night coverage provider listed on amion.

## 2022-04-05 NOTE — Progress Notes (Signed)
CPAP placement deferred at this time. Pt restrained via handcuffs to bed. Pt stated he should use CPAP but mostly doesn't and uses nasal pillows and does not recall settings ("2"). CPAP unit w/nasal mask available when indicated. Discussed w/RN and we will monitor pt for indication and reassess situation at that time and will keep set up available.

## 2022-04-05 NOTE — H&P (Signed)
History and Physical      Connor Baker [id number] DOB: 04/04/1960 DOA: 04/04/2022  PCP: [facility]  Patient coming from: home   I have personally briefly reviewed patient's old medical records in [place]  Chief Complaint: chest pain  HPI: Connor Baker is a 62 y.o. male with medical history significant for coronary artery disease status post PCI with stents, chronic angina, GERD, hyperlipidemia, obstructive sleep apnea on nocturnal CPAP, seizure disorder, who is admitted to [facility] on 04/04/2022 with chest pain after presenting from prison to [facility] ED complaining of chest pain.   Patient with an extensive cardiac history, including extensive history of CAD status post PCI with several stents, having most recently undergone left-sided coronary angiography in August 2018.  In this context, he is on chronic dual antiplatelet therapy with daily baby aspirin and Plavix.  He also has a history of chronic angina, for which she is on Ranexa.   The patient, who is presenting from prison, notes that he recently transferred facilities 4 days ago, and since the time of this transfer, has been unable to receive his typical cardiac medications, including being off of Ranexa, his high intensity atorvastatin and his dual antiplatelet therapy over the last 4 days.  He presents this evening complaining of 2 days of substernal nonradiating chest pressure, that is nonexertional, nonpositional, nonpleuritic, not reproducible with palpation of the anterior chest wall.  Pain has been nearly constant over the course the last 48 hours, and has been associated with mild shortness of breath in the absence of palpitations, diaphoresis, nausea, vomiting, dizziness, presyncope, or syncope.  He conveys that the chest pain that has been experiencing over the last 48 hours is similar to that which she is experienced at times leading up to previous cardiac catheterizations.  Following his most recent  cardiac cath in 2018, he denies any additional coronary ensuing ischemic evaluation, including no interval stress test.  Most recent echocardiogram occurred in March 2022 and was notable for LVEF 60 to 65%, no focal wall motion abnormalities, normal right ventricular systolic function, and no significant valvular pathology.  He notes that the chest pain that he has been experiencing over the last 2 days has not been associated with a subjective fever, chills, rigors, generalized myalgias.  No new cough, mopped assist.  Denies any new onset calf tenderness or new lower extremity erythema.  No new peripheral edema.  No recent trauma.  Has not tried any nitroglycerin to evaluate response relative to this recent chest pain.   Medical history also notable for hyperlipidemia.  No known history of underlying diabetes.      ED Course:  Vital signs in the ED were notable for the following: Afebrile; heart rate 56-73; start blood pressure in the low 100s to 120s; respiratory rate 14-21; oxygen saturation 94 to 100% on room air.  Labs were notable for the following: CMP notable for the following: Sodium 130, potassium 3.9, bicarbonate 23, creatinine 0.87, calcium, just for mild hypoalbuminemia noted to be 8.9, Avva 3.5, otherwise liver enzymes within normal limits.  Lipase 31.  High-sensitivity troponin and I x 2 values were both found to be 4.  CBC notable for white blood cell count 7500, hemoglobin 13.6.  Per my interpretation, EKG in ED demonstrated the following: EKG shows sinus rhythm with rate 71, normal intervals, and no evidence of T wave or ST changes, including no evidence of ST elevation.  Imaging and additional notable ED work-up:  Chest x-ray shows no evidence of acute cardiopulmonary process.  CTA chest shows no evidence of acute pulmonary embolism nor any other evidence of acute cardiopulmonary process, including no evidence of infiltrate, edema, effusion, or pneumothorax.  EDP discussed the  patient's case with the on-call cardiology fellow, who recommended observation to the hospitalist service for further evaluation and management of presenting chest pain, recommending further trending of troponin, proceed with echocardiogram, can be the cardiology will formally consult and see the patient in the morning, with additional recommendations to occur at that time.  While in the ED, the following were administered: Morphine 2 mg IV x 1, acetaminophen 1 g p.o. x 1.  Subsequently, the patient was admitted for further evaluation management of presenting chest pain.     Review of Systems: As per HPI otherwise 10 point review of systems negative.   Past Medical History:  Diagnosis Date   Arthritis    "legs" (08/28/2016)   CHF (congestive heart failure) (HCC)    Chronic lower back pain    Coronary artery disease    CVA (cerebral vascular accident) ([place])    Depression    GERD (gastroesophageal reflux disease)    High cholesterol    Hypertension    MI (myocardial infarction) ([place]) 1992; 1994; 1995   MI (myocardial infarction) ([place])    [phone]   On home oxygen therapy    "4L just at night w/my CPAP" (08/28/2016)   OSA (obstructive sleep apnea)    w/oxygen" (08/28/2016)   Stroke ([place]) 2005   "mild one; faded my memory" (08/28/2016)    Past Surgical History:  Procedure Laterality Date   ANKLE FRACTURE SURGERY Right 2016   [place] - 2017   "I've got a total of 16 stents in me" (08/28/2016)   CORONARY BALLOON ANGIOPLASTY Right 08/28/2016   Procedure: Coronary Balloon Angioplasty;  Surgeon: Belva Crome, MD;  Location: [place] [facility];  Service: Cardiovascular;  Laterality: Right;   FRACTURE SURGERY     LEFT HEART CATH AND CORONARY ANGIOGRAPHY N/A 08/28/2016   Procedure: Left Heart Cath and Coronary Angiography;  Surgeon: Belva Crome, MD;  Location: [place] [facility];  Service: Cardiovascular;  Laterality: N/A;    LEFT HEART CATH AND CORONARY ANGIOGRAPHY N/A 10/24/2016   Procedure: LEFT HEART CATH AND CORONARY ANGIOGRAPHY;  Surgeon: Martinique, Peter M, MD;  Location: [place] [facility];  Service: Cardiovascular;  Laterality: N/A;   TOTAL HIP ARTHROPLASTY Right 1990    Social History:  reports that he has been smoking cigarettes. He has a 1.00 pack-year smoking history. He has never used smokeless tobacco. He reports current alcohol use. He reports that he does not currently use drugs.   No Known Allergies  Family History  Problem Relation Age of Onset   Heart attack Father    Diabetes Mother     Family history reviewed and not pertinent    Prior to Admission medications   Medication Sig Start Date End Date Taking? Authorizing Provider  acetaminophen (TYLENOL) 500 MG tablet Take 500-1,000 mg by mouth every 6 (six) hours as needed for mild pain (or headaches).   Yes [provider]  ascorbic acid (VITAMIN C) 500 MG tablet Take 500 mg by mouth daily.   Yes [provider]  aspirin EC 81 MG EC tablet Take 1 tablet (81 mg total) by mouth daily. Swallow whole. 05/16/20  Yes Tommie Raymond, NP  atorvastatin (LIPITOR) 80 MG tablet Take 1 tablet (80 mg total) by mouth daily. 05/16/20  Yes Kathyrn Drown D, NP  cholecalciferol (VITAMIN D3) 25 MCG (1000 UNIT) tablet Take 1,000 Units by mouth daily.   Yes [provider]  clopidogrel (PLAVIX) 75 MG tablet Take 1 tablet (75 mg total) by mouth daily. 05/15/20  Yes Kathyrn Drown D, NP  levETIRAcetam (KEPPRA) 500 MG tablet Take 1 tablet (500 mg total) by mouth 2 (two) times daily. 07/23/20  Yes Clovis Riley, MD  loratadine (CLARITIN) 10 MG tablet Take 10 mg by mouth at bedtime.   Yes [provider]  naproxen sodium (ALEVE) 220 MG tablet Take 220-440 mg by mouth 2 (two) times daily as needed (for mild pain or headaches).   Yes [provider]  nitroGLYCERIN (NITROSTAT) 0.4 MG SL tablet Place 1 tablet (0.4 mg  total) under the tongue every 5 (five) minutes x 3 doses as needed for chest pain. 01/02/18  Yes Kathyrn Drown D, NP  ranolazine (RANEXA) 500 MG 12 hr tablet Take 1 tablet (500 mg total) by mouth 2 (two) times daily. 11/26/20  Yes Quintella Reichert, MD  benzonatate (TESSALON) 100 MG capsule Take 1 capsule (100 mg total) by mouth every 8 (eight) hours. Patient not taking: Reported on 11/16/2020 11/08/20   Arnaldo Natal, MD  tiZANidine (ZANAFLEX) 2 MG tablet Take 1 tablet (2 mg total) by mouth every 8 (eight) hours as needed for muscle spasms. Patient not taking: Reported on 04/05/2022 09/21/20   Hazel Sams, PA-C     Objective    Physical Exam: Vitals:   04/05/22 0430 04/05/22 0445 04/05/22 0500 04/05/22 0515  BP: (!) 131/93 119/83 121/81 109/83  Pulse: 61 (!) 54 62 60  Resp: 15 16 14 16  $ Temp:      TempSrc:      SpO2: 97% 95% 95% 95%  Weight:      Height:        General: appears to be stated age; alert, oriented Skin: warm, dry, no rash Head:  AT/[place] Mouth:  Oral mucosa membranes appear moist, normal dentition Neck: supple; trachea midline Heart:  RRR; did not appreciate any M/R/G Lungs: CTAB, did not appreciate any wheezes, rales, or rhonchi Abdomen: + BS; soft, ND, NT Vascular: 2+ pedal pulses b/l; 2+ radial pulses b/l Extremities: no peripheral edema, no muscle wasting Neuro: strength and sensation intact in upper and lower extremities b/l    Labs on Admission: I have personally reviewed following labs and imaging studies  CBC: Recent Labs  Lab 04/04/22 2252  WBC 7.5  NEUTROABS 3.2  HGB 13.6  HCT 38.3*  MCV 97.0  PLT [id number]   Basic Metabolic Panel: Recent Labs  Lab 04/04/22 2252  NA 138  K 3.9  CL 107  CO2 23  GLUCOSE 97  BUN 10  CREATININE 0.87  CALCIUM 8.5*   GFR: Estimated Creatinine Clearance: 97.6 mL/min (by C-G formula based on SCr of 0.87 mg/dL). Liver Function Tests: Recent Labs  Lab 04/04/22 2252  AST 29  ALT 48*  ALKPHOS 56  BILITOT 0.6   PROT 6.1*  ALBUMIN 3.5   Recent Labs  Lab 04/04/22 2252  LIPASE 31   No results for input(s): "AMMONIA" in the last 168 hours. Coagulation Profile: No results for input(s): "INR", "PROTIME" in the last 168 hours. Cardiac Enzymes: No results for input(s): "CKTOTAL", "CKMB", "CKMBINDEX", "TROPONINI" in the last 168 hours. BNP (last 3 results) No results for input(s): "  PROBNP" in the last 8760 hours. HbA1C: No results for input(s): "HGBA1C" in the last 72 hours. CBG: No results for input(s): "GLUCAP" in the last 168 hours. Lipid Profile: No results for input(s): "CHOL", "HDL", "LDLCALC", "TRIG", "CHOLHDL", "LDLDIRECT" in the last 72 hours. Thyroid Function Tests: No results for input(s): "TSH", "T4TOTAL", "FREET4", "T3FREE", "THYROIDAB" in the last 72 hours. Anemia Panel: No results for input(s): "VITAMINB12", "FOLATE", "FERRITIN", "TIBC", "IRON", "RETICCTPCT" in the last 72 hours. Urine analysis:    Component Value Date/Time   COLORURINE YELLOW 07/21/2020 1331   APPEARANCEUR CLEAR 07/21/2020 1331   LABSPEC 1.025 07/21/2020 1331   PHURINE 5.0 07/21/2020 1331   GLUCOSEU NEGATIVE 07/21/2020 1331   HGBUR MODERATE (A) 07/21/2020 1331   BILIRUBINUR NEGATIVE 07/21/2020 1331   KETONESUR NEGATIVE 07/21/2020 1331   PROTEINUR NEGATIVE 07/21/2020 1331   NITRITE NEGATIVE 07/21/2020 1331   LEUKOCYTESUR NEGATIVE 07/21/2020 1331    Radiological Exams on Admission: CT Angio Chest PE W and/or Wo Contrast  Result Date: 04/05/2022 CLINICAL DATA:  History of DVT with right chest pain EXAM: CT ANGIOGRAPHY CHEST WITH CONTRAST TECHNIQUE: Multidetector CT imaging of the chest was performed using the standard protocol during bolus administration of intravenous contrast. Multiplanar CT image reconstructions and MIPs were obtained to evaluate the vascular anatomy. RADIATION DOSE REDUCTION: This exam was performed according to the departmental dose-optimization program which includes automated  exposure control, adjustment of the mA and/or kV according to patient size and/or use of iterative reconstruction technique. CONTRAST:  69m OMNIPAQUE IOHEXOL 350 MG/ML SOLN COMPARISON:  07/21/2020 FINDINGS: Cardiovascular: No filling defects in the pulmonary arteries to suggest pulmonary emboli. Heart is normal size. Aorta is normal caliber. Diffuse coronary artery calcifications and scattered aortic calcifications. Mediastinum/Nodes: No mediastinal, hilar, or axillary adenopathy. Trachea and esophagus are unremarkable. Thyroid unremarkable. Lungs/Pleura: Dependent atelectasis in the lungs.  No effusions. Upper Abdomen: No acute findings Musculoskeletal: Chest wall soft tissues are unremarkable. No acute bony abnormality. Review of the MIP images confirms the above findings. IMPRESSION: No evidence of pulmonary embolus. Diffuse coronary artery disease. No acute cardiopulmonary disease. Aortic Atherosclerosis (ICD10-I70.0). Electronically Signed   By: KRolm BaptiseM.D.   On: 04/05/2022 00:37   DG Chest Portable 1 View  Result Date: 04/04/2022 CLINICAL DATA:  Chest pain. EXAM: PORTABLE CHEST 1 VIEW COMPARISON:  Chest radiograph dated 12/28/2021. FINDINGS: No focal consolidation, pleural effusion, or pneumothorax the the cardiac silhouette is within limits. Atherosclerotic calcification of the aortic arch. No acute osseous pathology. IMPRESSION: No active disease. Electronically Signed   By: AAnner CreteM.D.   On: 04/04/2022 23:16      Assessment/Plan   Principal Problem:   Chest pain Active Problems:   HLD (hyperlipidemia)   GERD (gastroesophageal reflux disease)   OSA (obstructive sleep apnea)   History of seizures   Allergic rhinitis      #) Chest Pain: 2 days of nonradiating substernal chest pressure associated with shortness of breath, nonexertional.  Patient with a an extensive history of CAD, as further outlined above, in addition to chronic stable angina, for which she is on Ranexa.   As the patient has been off of his Ranexa over the course of the last 4 days, his presenting chest pain may represent his chronic underlying stable angina in response to the absence of interval Ranexa.  However, given his extensive cardiac history with multiple CAD risk factors predisposing him to progression of known underlying CAD, will bring the patient in for overnight observation for further  evaluation and to rule out ACS.  Of note, troponin x 2 have been nonelevated and flat, quantified above, EKG shows sinus rhythm without any overt evidence of acute ischemic changes, including no evidence of ST elevation.  Chest x-ray showed no evidence of acute cardiopulmonary process, while CTA chest shows no acute pulmonary [place] or any additional evidence of acute cardiopulmonary process, including no evidence of pneumothorax.  EDP discussed the patient's case with the on-call cardiology fellow, who recommended observation to the hospitalist service for further evaluation and management of presenting chest pain, recommending further trending of troponin, proceed with echocardiogram, can be the cardiology will formally consult and see the patient in the morning, with additional recommendations to occur at that time, including providing assistance with determining the necessity/nature of additional ischemic evaluation, noting no coronary ischemic evaluation in the interval since left-sided coronary angiography in August 2018.  Aside from chronic stable angina versus unstable angina versus other ACS, differential also includes non-cardiac etiologies including musculoskeletal possibilities vs GI sources including GERD in the context of a documented history of the latter.  Of note, lipase nonelevated.  It is noted that does not appear that the patient is on beta-blocker nor ACE inhibitor/ARB as an outpatient.    Plan: trend serial troponin. Monitor on telemetry. prn Morphine.  Will repeat EKG in the morning.   Cardiology to consult, as above.  Check serum Mg level and repeat BMP in the morning, with prn supplementation to maintain Mg and potassium levels greater than or equal to 2.0 and 4.0, respectively, to further reduce risk of ventricular arrhythmia.  Specifically, will provide potassium chloride 40 meq p.o. x 1 dose now.  Repeat CBC in the AM.  Full dose aspirin x 1 now followed by resumption of chronic dual antiplatelet therapy.  Resume home Ranexa, with first dose now.  Resume outpatient high intensity atorvastatin, first dose now.  Echocardiogram in the morning.  Protonix 40 mg IV daily, first dose now.  Will hold Claritin for now to reduce any contribution from anticholinergic properties of this medication.             #) GERD: documented h/o such; however, it does not appear that he is on any PPI or H2 blocker as an outpatient.  Plan: Protonix 40 mg IV daily, first dose now, as above.               #) Hyperlipidemia: documented h/o such. On high intensity atorvastatin as outpatient.   Plan: continue home statin, with first dose now.Marland Kitchen               #) Obstructive sleep apnea: Documented history of such, with patient reporting good compliance on home nocturnal CPAP.   Plan: I have placed respiratory therapy consultation for provision of scheduled nocturnal CPAP.                #) History of seizures: Documented history of such, without clinical evidence to suggest active seizures at this time.  Outpatient antiepileptic regimen consists of: Keppra 500 mg p.o. twice daily.    Plan: Continue outpatient antiepileptic regimen.             #) Allergic Rhinitis: documented h/o such, on scheduled Claritin as an outpatient, in the absence of any scheduled use of intranasal Flonase.   Plan: In the setting of his presenting chest pain, will outpatient home Claritin for now, as above.      DVT prophylaxis: SCD's   Code Status: Full  code Family Communication: none Disposition Plan: Per Rounding Team Consults called: EDP discussed patient's case with the on-call cardiology fellow, as further detailed above;  Admission status: Observation     I SPENT GREATER THAN 75  MINUTES IN CLINICAL CARE TIME/MEDICAL DECISION-MAKING IN COMPLETING THIS ADMISSION.      [place] DO [facility]  From [place]   04/05/2022, 5:53 AM

## 2022-04-05 NOTE — ED Provider Notes (Signed)
  Provider Note MRN:  [id number]  Arrival date & time: 04/05/22    ED Course and Medical Decision Making  Assumed care from Dr. Maylon Peppers at shift change.  Chest pain, history of CAD but favored noncardiac this evening, first troponin negative, awaiting second troponin and CT PE study.  If workup is all reassuring anticipating discharge.  4:30 AM update: Second troponin is negative.  On my evaluation of the patient he is still having chest pain, described as a pressure, with associated symptoms of malaise, nausea, headache.  He explains that he was without his medications for 4 days because he was moved from one present facility to another and after being without his medications for 4 days that is when his pain started.  Has not had ischemic evaluation since his cath in 2018.  Case discussed with cardiology, no acute intervention, with continued pain and his cardiac history admission and morning echo is most appropriate.  Will admit to medicine.  Procedures  Final Clinical Impressions(s) / ED Diagnoses     ICD-10-CM   1. Chest pain, unspecified type  R07.9       ED Discharge Orders          Ordered    Ambulatory referral to Cardiology  Status:  Canceled       Comments: If you have not heard from the Cardiology office within the next 72 hours please call [phone].   04/04/22 2353              Discharge Instructions      You were seen in the emergency department today for chest pain. You workup did not reveal a definite cause of your symptoms but was generally reassuring.   Return to the emergency department immediately if you develop recurrent, severe chest pain, shortness of breath, fainting spells, sudden sweatiness, or any other concerning symptoms.   Please also make an appointment to follow up with your primary care doctor or cardiologist within one week to assure improvement or resolution in symptoms. Further testing may be necessary, so it is extremely important to keep  your follow-up appointment with your primary doctor.      Barth Kirks. [place] Small, [place] [email or web address]    Maudie Flakes, MD 04/05/22 [phone]

## 2022-04-05 NOTE — ED Notes (Signed)
ED TO INPATIENT HANDOFF REPORT  ED Nurse Name and Phone #: Jacqulyn Bath [phone]  S Name/Age/Gender Connor Baker 62 y.o. male Room/Bed: 017C/017C  Code Status   Code Status: Full Code  Home/SNF/Other Jail Patient oriented to: self, place, time, and situation Is this baseline? Yes   Triage Complete: Triage complete  Chief Complaint Chest pain [R07.9]  Triage Note Pt brought in by EMS c/o chest pain and shortness of breath that started approximately 45 minutes ago. Hx of stemi and blood clots.    Allergies No Known Allergies  Level of Care/Admitting Diagnosis ED Disposition     ED Disposition  Admit   Condition  --   Comment  Hospital Area: [facility] [[id number]]  Level of Care: Telemetry Cardiac [103]  May place patient in observation at [facility] or [place] if equivalent level of care is available:: No  Covid Evaluation: Asymptomatic - no recent exposure (last 10 days) testing not required  Diagnosis: Chest pain [id number]  Admitting Physician: Rhetta Mura [id number]  Attending Physician: Rhetta Mura [id number]          B Medical/Surgery History Past Medical History:  Diagnosis Date   Arthritis    "legs" (08/28/2016)   CHF (congestive heart failure) (HCC)    Chronic lower back pain    Coronary artery disease    CVA (cerebral vascular accident) ([place])    Depression    GERD (gastroesophageal reflux disease)    High cholesterol    Hypertension    MI (myocardial infarction) ([place]) 1992; 1994; 1995   MI (myocardial infarction) ([place])    [id number]   On home oxygen therapy    "4L just at night w/my CPAP" (08/28/2016)   OSA (obstructive sleep apnea)    w/oxygen" (08/28/2016)   Stroke ([place]) 2005   "mild one; faded my memory" (08/28/2016)   Past Surgical History:  Procedure Laterality Date   ANKLE FRACTURE SURGERY Right 2016   Advance - 2017   "I've got a total  of 16 stents in me" (08/28/2016)   CORONARY BALLOON ANGIOPLASTY Right 08/28/2016   Procedure: Coronary Balloon Angioplasty;  Surgeon: Belva Crome, MD;  Location: [facility];  Service: Cardiovascular;  Laterality: Right;   FRACTURE SURGERY     LEFT HEART CATH AND CORONARY ANGIOGRAPHY N/A 08/28/2016   Procedure: Left Heart Cath and Coronary Angiography;  Surgeon: Belva Crome, MD;  Location: [facility];  Service: Cardiovascular;  Laterality: N/A;   LEFT HEART CATH AND CORONARY ANGIOGRAPHY N/A 10/24/2016   Procedure: LEFT HEART CATH AND CORONARY ANGIOGRAPHY;  Surgeon: Martinique, Peter M, MD;  Location: [facility];  Service: Cardiovascular;  Laterality: N/A;   TOTAL HIP ARTHROPLASTY Right 1990     A IV Location/Drains/Wounds Patient Lines/Drains/Airways Status     Active Line/Drains/Airways     Name Placement date Placement time Site Days   Peripheral IV 04/04/22 18 G Anterior;Distal;Left;Upper Antecubital 04/04/22  2241  Antecubital  1            Intake/Output Last 24 hours  Intake/Output Summary (Last 24 hours) at 04/05/2022 [id number] Last data filed at 04/04/2022 2241 Gross per 24 hour  Intake 500 ml  Output --  Net 500 ml    Labs/Imaging Results for orders placed or performed during the hospital encounter of 04/04/22 (from the past 48 hour(s))  Comprehensive metabolic panel  Status: Abnormal   Collection Time: 04/04/22 10:52 PM  Result Value Ref Range   Sodium 138 135 - 145 mmol/L   Potassium 3.9 3.5 - 5.1 mmol/L   Chloride 107 98 - 111 mmol/L   CO2 23 22 - 32 mmol/L   Glucose, Bld 97 70 - 99 mg/dL    Comment: Glucose reference range applies only to samples taken after fasting for at least 8 hours.   BUN 10 8 - 23 mg/dL   Creatinine, Ser 0.87 0.61 - 1.24 mg/dL   Calcium 8.5 (L) 8.9 - 10.3 mg/dL   Total Protein 6.1 (L) 6.5 - 8.1 g/dL   Albumin 3.5 3.5 - 5.0 g/dL   AST 29 15 - 41 U/L   ALT 48 (H) 0 - 44 U/L   Alkaline Phosphatase 56 38 - 126 U/L    Total Bilirubin 0.6 0.3 - 1.2 mg/dL   GFR, Estimated >60 >60 mL/min    Comment: (NOTE) Calculated using the CKD-EPI Creatinine Equation (2021)    Anion gap 8 5 - 15    Comment: Performed at [facility], [place] [address], [place], [place] [address]  Lipase, blood     Status: None   Collection Time: 04/04/22 10:52 PM  Result Value Ref Range   Lipase 31 11 - 51 U/L    Comment: Performed at [facility] [address] [place], [place] [address]  CBC with Differential     Status: Abnormal   Collection Time: 04/04/22 10:52 PM  Result Value Ref Range   WBC 7.5 4.0 - 10.5 K/uL   RBC 3.95 (L) 4.22 - 5.81 MIL/uL   Hemoglobin 13.6 13.0 - 17.0 g/dL   HCT 38.3 (L) 39.0 - 52.0 %   MCV 97.0 80.0 - 100.0 fL   MCH 34.4 (H) 26.0 - 34.0 pg   MCHC 35.5 30.0 - 36.0 g/dL   RDW 12.8 11.5 - 15.5 %   Platelets 191 150 - 400 K/uL   nRBC 0.0 0.0 - 0.2 %   Neutrophils Relative % 43 %   Neutro Abs 3.2 1.7 - 7.7 K/uL   Lymphocytes Relative 41 %   Lymphs Abs 3.1 0.7 - 4.0 K/uL   Monocytes Relative 12 %   Monocytes Absolute 0.9 0.1 - 1.0 K/uL   Eosinophils Relative 3 %   Eosinophils Absolute 0.3 0.0 - 0.5 K/uL   Basophils Relative 1 %   Basophils Absolute 0.1 0.0 - 0.1 K/uL   Immature Granulocytes 0 %   Abs Immature Granulocytes 0.03 0.00 - 0.07 K/uL    Comment: Performed at [facility] [address], [place], [place] [address]  Troponin I (High Sensitivity)     Status: None   Collection Time: 04/04/22 10:52 PM  Result Value Ref Range   Troponin I (High Sensitivity) 4 <18 ng/L    Comment: (NOTE) Elevated high sensitivity troponin I (hsTnI) values and significant  changes across serial measurements may suggest ACS but many other  chronic and acute conditions are known to elevate hsTnI results.  Refer to the "Links" section for chest pain algorithms and additional  guidance. Performed at [facility], [place] [address], [place], [place] [address]   Troponin I (High  Sensitivity)     Status: None   Collection Time: 04/05/22  2:30 AM  Result Value Ref Range   Troponin I (High Sensitivity) 4 <18 ng/L    Comment: (NOTE) Elevated high sensitivity troponin I (hsTnI)  values and significant  changes across serial measurements may suggest ACS but many other  chronic and acute conditions are known to elevate hsTnI results.  Refer to the "Links" section for chest pain algorithms and additional  guidance. Performed at [facility], [place] [address], [place], [place] [address]   CBC with Differential/Platelet     Status: Abnormal   Collection Time: 04/05/22  5:49 AM  Result Value Ref Range   WBC 8.1 4.0 - 10.5 K/uL   RBC 4.02 (L) 4.22 - 5.81 MIL/uL   Hemoglobin 14.0 13.0 - 17.0 g/dL   HCT 39.1 39.0 - 52.0 %   MCV 97.3 80.0 - 100.0 fL   MCH 34.8 (H) 26.0 - 34.0 pg   MCHC 35.8 30.0 - 36.0 g/dL   RDW 13.1 11.5 - 15.5 %   Platelets 181 150 - 400 K/uL   nRBC 0.0 0.0 - 0.2 %   Neutrophils Relative % 42 %   Neutro Abs 3.4 1.7 - 7.7 K/uL   Lymphocytes Relative 42 %   Lymphs Abs 3.5 0.7 - 4.0 K/uL   Monocytes Relative 11 %   Monocytes Absolute 0.9 0.1 - 1.0 K/uL   Eosinophils Relative 3 %   Eosinophils Absolute 0.3 0.0 - 0.5 K/uL   Basophils Relative 1 %   Basophils Absolute 0.1 0.0 - 0.1 K/uL   Immature Granulocytes 1 %   Abs Immature Granulocytes 0.05 0.00 - 0.07 K/uL    Comment: Performed at [facility] [address], [place], [place] [address]  Comprehensive metabolic panel     Status: Abnormal   Collection Time: 04/05/22  5:49 AM  Result Value Ref Range   Sodium 139 135 - 145 mmol/L   Potassium 3.5 3.5 - 5.1 mmol/L   Chloride 105 98 - 111 mmol/L   CO2 26 22 - 32 mmol/L   Glucose, Bld 89 70 - 99 mg/dL    Comment: Glucose reference range applies only to samples taken after fasting for at least 8 hours.   BUN 8 8 - 23 mg/dL   Creatinine, Ser 0.87 0.61 - 1.24 mg/dL   Calcium 8.7 (L) 8.9 - 10.3 mg/dL   Total Protein 6.6 6.5 - 8.1 g/dL    Albumin 3.7 3.5 - 5.0 g/dL   AST 32 15 - 41 U/L   ALT 51 (H) 0 - 44 U/L   Alkaline Phosphatase 60 38 - 126 U/L   Total Bilirubin 1.1 0.3 - 1.2 mg/dL   GFR, Estimated >60 >60 mL/min    Comment: (NOTE) Calculated using the CKD-EPI Creatinine Equation (2021)    Anion gap 8 5 - 15    Comment: Performed at [facility] [address] [place], [place] [address]  Magnesium     Status: None   Collection Time: 04/05/22  5:49 AM  Result Value Ref Range   Magnesium 2.1 1.7 - 2.4 mg/dL    Comment: Performed at [facility] [address] [place],  [address]   CT Angio Chest PE W and/or Wo Contrast  Result Date: 04/05/2022 CLINICAL DATA:  History of DVT with right chest pain EXAM: CT ANGIOGRAPHY CHEST WITH CONTRAST TECHNIQUE: Multidetector CT imaging of the chest was performed using the standard protocol during bolus administration of intravenous contrast. Multiplanar CT image reconstructions and MIPs were obtained to evaluate the vascular anatomy. RADIATION DOSE REDUCTION: This exam was performed according to the departmental dose-optimization program which includes automated exposure control,  adjustment of the mA and/or kV according to patient size and/or use of iterative reconstruction technique. CONTRAST:  43m OMNIPAQUE IOHEXOL 350 MG/ML SOLN COMPARISON:  07/21/2020 FINDINGS: Cardiovascular: No filling defects in the pulmonary arteries to suggest pulmonary emboli. Heart is normal size. Aorta is normal caliber. Diffuse coronary artery calcifications and scattered aortic calcifications. Mediastinum/Nodes: No mediastinal, hilar, or axillary adenopathy. Trachea and esophagus are unremarkable. Thyroid unremarkable. Lungs/Pleura: Dependent atelectasis in the lungs.  No effusions. Upper Abdomen: No acute findings Musculoskeletal: Chest wall soft tissues are unremarkable. No acute bony abnormality. Review of the MIP images confirms the above findings. IMPRESSION: No evidence of pulmonary  embolus. Diffuse coronary artery disease. No acute cardiopulmonary disease. Aortic Atherosclerosis (ICD10-I70.0). Electronically Signed   By: KRolm BaptiseM.D.   On: 04/05/2022 00:37   DG Chest Portable 1 View  Result Date: 04/04/2022 CLINICAL DATA:  Chest pain. EXAM: PORTABLE CHEST 1 VIEW COMPARISON:  Chest radiograph dated 12/28/2021. FINDINGS: No focal consolidation, pleural effusion, or pneumothorax the the cardiac silhouette is within limits. Atherosclerotic calcification of the aortic arch. No acute osseous pathology. IMPRESSION: No active disease. Electronically Signed   By: AAnner CreteM.D.   On: 04/04/2022 23:16    Pending Labs Unresulted Labs (From admission, onward)    None       Vitals/Pain Today's Vitals   04/05/22 0445 04/05/22 0500 04/05/22 0515 04/05/22 0610  BP: 119/83 121/81 109/83   Pulse: (!) 54 62 60   Resp: 16 14 16   $ Temp:      TempSrc:      SpO2: 95% 95% 95%   Weight:      Height:      PainSc:    9     Isolation Precautions No active isolations  Medications Medications  acetaminophen (TYLENOL) tablet 650 mg (has no administration in time range)    Or  acetaminophen (TYLENOL) suppository 650 mg (has no administration in time range)  melatonin tablet 3 mg (has no administration in time range)  naloxone (NARCAN) injection 0.4 mg (has no administration in time range)  morphine (PF) 4 MG/ML injection 3 mg (3 mg Intravenous Given 04/05/22 0610)  ranolazine (RANEXA) 12 hr tablet 500 mg (has no administration in time range)  atorvastatin (LIPITOR) tablet 80 mg (80 mg Oral Given 04/05/22 0617)  pantoprazole (PROTONIX) injection 40 mg (40 mg Intravenous Given 04/05/22 0612)  aspirin EC tablet 81 mg (has no administration in time range)  clopidogrel (PLAVIX) tablet 75 mg (has no administration in time range)  levETIRAcetam (KEPPRA) tablet 500 mg (has no administration in time range)  potassium chloride SA (KLOR-CON M) CR tablet 40 mEq (has no administration  in time range)  acetaminophen (TYLENOL) tablet 1,000 mg (1,000 mg Oral Given 04/04/22 2256)  iohexol (OMNIPAQUE) 350 MG/ML injection 75 mL (75 mLs Intravenous Contrast Given 04/05/22 0027)  morphine (PF) 2 MG/ML injection 2 mg (2 mg Intravenous Given 04/05/22 0115)  aspirin tablet 325 mg (325 mg Oral Given 04/05/22 0617)    Mobility walks     Focused Assessments Cardiac Assessment Handoff:  Cardiac Rhythm: Normal sinus rhythm Lab Results  Component Value Date   TROPONINI <0.03 01/01/2018   No results found for: "DDIMER" Does the Patient currently have chest pain? Yes    R Recommendations: See Admitting Provider Note  Report given to:   Additional Notes: Cardiac workup came back clear. Cardiology would like to trend troponins/perform echo/and restart patient on home meds that he used to be on  d/t hx MI. PT A&Ox4 and independent. In police custody. Pt and calm/pleasant/cooperative.

## 2022-04-06 DIAGNOSIS — Z96641 Presence of right artificial hip joint: Secondary | ICD-10-CM | POA: Diagnosis present

## 2022-04-06 DIAGNOSIS — M1990 Unspecified osteoarthritis, unspecified site: Secondary | ICD-10-CM | POA: Diagnosis present

## 2022-04-06 DIAGNOSIS — Z86718 Personal history of other venous thrombosis and embolism: Secondary | ICD-10-CM | POA: Diagnosis not present

## 2022-04-06 DIAGNOSIS — J301 Allergic rhinitis due to pollen: Secondary | ICD-10-CM | POA: Diagnosis not present

## 2022-04-06 DIAGNOSIS — Z79899 Other long term (current) drug therapy: Secondary | ICD-10-CM | POA: Diagnosis not present

## 2022-04-06 DIAGNOSIS — Z87898 Personal history of other specified conditions: Secondary | ICD-10-CM | POA: Diagnosis not present

## 2022-04-06 DIAGNOSIS — F32A Depression, unspecified: Secondary | ICD-10-CM | POA: Diagnosis not present

## 2022-04-06 DIAGNOSIS — K219 Gastro-esophageal reflux disease without esophagitis: Secondary | ICD-10-CM | POA: Diagnosis not present

## 2022-04-06 DIAGNOSIS — R079 Chest pain, unspecified: Secondary | ICD-10-CM | POA: Diagnosis not present

## 2022-04-06 DIAGNOSIS — Z9981 Dependence on supplemental oxygen: Secondary | ICD-10-CM | POA: Diagnosis not present

## 2022-04-06 DIAGNOSIS — Z91148 Patient's other noncompliance with medication regimen for other reason: Secondary | ICD-10-CM | POA: Diagnosis not present

## 2022-04-06 DIAGNOSIS — I25118 Atherosclerotic heart disease of native coronary artery with other forms of angina pectoris: Secondary | ICD-10-CM | POA: Diagnosis present

## 2022-04-06 DIAGNOSIS — I252 Old myocardial infarction: Secondary | ICD-10-CM | POA: Diagnosis not present

## 2022-04-06 DIAGNOSIS — Z7902 Long term (current) use of antithrombotics/antiplatelets: Secondary | ICD-10-CM | POA: Diagnosis not present

## 2022-04-06 DIAGNOSIS — J309 Allergic rhinitis, unspecified: Secondary | ICD-10-CM | POA: Diagnosis not present

## 2022-04-06 DIAGNOSIS — I2510 Atherosclerotic heart disease of native coronary artery without angina pectoris: Secondary | ICD-10-CM | POA: Diagnosis not present

## 2022-04-06 DIAGNOSIS — G4733 Obstructive sleep apnea (adult) (pediatric): Secondary | ICD-10-CM | POA: Diagnosis not present

## 2022-04-06 DIAGNOSIS — E7800 Pure hypercholesterolemia, unspecified: Secondary | ICD-10-CM | POA: Diagnosis not present

## 2022-04-06 DIAGNOSIS — J9811 Atelectasis: Secondary | ICD-10-CM | POA: Diagnosis not present

## 2022-04-06 DIAGNOSIS — Z955 Presence of coronary angioplasty implant and graft: Secondary | ICD-10-CM | POA: Diagnosis not present

## 2022-04-06 DIAGNOSIS — Z7982 Long term (current) use of aspirin: Secondary | ICD-10-CM | POA: Diagnosis not present

## 2022-04-06 DIAGNOSIS — Z1152 Encounter for screening for COVID-19: Secondary | ICD-10-CM | POA: Diagnosis not present

## 2022-04-06 DIAGNOSIS — Z8673 Personal history of transient ischemic attack (TIA), and cerebral infarction without residual deficits: Secondary | ICD-10-CM | POA: Diagnosis not present

## 2022-04-06 DIAGNOSIS — R072 Precordial pain: Secondary | ICD-10-CM | POA: Diagnosis not present

## 2022-04-06 DIAGNOSIS — F17210 Nicotine dependence, cigarettes, uncomplicated: Secondary | ICD-10-CM | POA: Diagnosis not present

## 2022-04-06 DIAGNOSIS — I25110 Atherosclerotic heart disease of native coronary artery with unstable angina pectoris: Secondary | ICD-10-CM | POA: Diagnosis not present

## 2022-04-06 DIAGNOSIS — Z833 Family history of diabetes mellitus: Secondary | ICD-10-CM | POA: Diagnosis not present

## 2022-04-06 DIAGNOSIS — I10 Essential (primary) hypertension: Secondary | ICD-10-CM | POA: Diagnosis not present

## 2022-04-06 DIAGNOSIS — G40909 Epilepsy, unspecified, not intractable, without status epilepticus: Secondary | ICD-10-CM | POA: Diagnosis not present

## 2022-04-06 DIAGNOSIS — I200 Unstable angina: Secondary | ICD-10-CM | POA: Diagnosis not present

## 2022-04-06 DIAGNOSIS — E782 Mixed hyperlipidemia: Secondary | ICD-10-CM | POA: Diagnosis not present

## 2022-04-06 DIAGNOSIS — Z8249 Family history of ischemic heart disease and other diseases of the circulatory system: Secondary | ICD-10-CM | POA: Diagnosis not present

## 2022-04-06 LAB — BASIC METABOLIC PANEL
Anion gap: 9 (ref 5–15)
BUN: 11 mg/dL (ref 8–23)
CO2: 25 mmol/L (ref 22–32)
Calcium: 8.8 mg/dL — ABNORMAL LOW (ref 8.9–10.3)
Chloride: 104 mmol/L (ref 98–111)
Creatinine, Ser: 0.97 mg/dL (ref 0.61–1.24)
GFR, Estimated: >60 mL/min (ref >60)
Glucose, Bld: 115 mg/dL — ABNORMAL HIGH (ref 70–99)
Potassium: 3.8 mmol/L (ref 3.5–5.1)
Sodium: 138 mmol/L (ref 135–145)

## 2022-04-06 LAB — CBC
HCT: 39.4 % (ref 39.0–52.0)
Hemoglobin: 13.4 g/dL (ref 13.0–17.0)
MCH: 33.5 pg (ref 26.0–34.0)
MCHC: 34 g/dL (ref 30.0–36.0)
MCV: 98.5 fL (ref 80.0–100.0)
Platelets: 174 10*3/uL (ref 150–400)
RBC: 4 MIL/uL — ABNORMAL LOW (ref 4.22–5.81)
RDW: 12.9 % (ref 11.5–15.5)
WBC: 7.5 10*3/uL (ref 4.0–10.5)
nRBC: 0 % (ref 0.0–0.2)

## 2022-04-06 LAB — LIPID PANEL
Cholesterol: 116 mg/dL (ref 0–200)
HDL: 39 mg/dL — ABNORMAL LOW (ref >40)
LDL Cholesterol: 35 mg/dL (ref 0–99)
Total CHOL/HDL Ratio: 3 RATIO
Triglycerides: 208 mg/dL — ABNORMAL HIGH (ref <150)
VLDL: 42 mg/dL — ABNORMAL HIGH (ref 0–40)

## 2022-04-06 MED ORDER — SODIUM CHLORIDE 0.9 % WEIGHT BASED INFUSION: 3.0000 mL/kg/h | INTRAVENOUS | Status: DC

## 2022-04-06 MED ORDER — ANGIOPLASTY BOOK
Freq: Once | Status: AC
  Filled 2022-04-06: qty 1

## 2022-04-06 MED ORDER — OMEGA-3-ACID ETHYL ESTERS 1 G PO CAPS
1.0000 g | ORAL_CAPSULE | Freq: Two times a day (BID) | ORAL | Status: DC
  Administered 2022-04-06 – 2022-04-08 (×5): 1 g via ORAL
  Filled 2022-04-06 (×4): qty 1

## 2022-04-06 MED ORDER — SODIUM CHLORIDE 0.9 % WEIGHT BASED INFUSION
1.0000 mL/kg/h | INTRAVENOUS | Status: DC
  Administered 2022-04-07: 1 mL/kg/h via INTRAVENOUS

## 2022-04-06 MED ORDER — SODIUM CHLORIDE 0.9 % IV SOLN: 250.0000 mL | INTRAVENOUS | Status: DC | PRN

## 2022-04-06 MED ORDER — SODIUM CHLORIDE 0.9% FLUSH: 3.0000 mL | INTRAVENOUS | Status: DC | PRN

## 2022-04-06 NOTE — Progress Notes (Signed)
Progress Note  Patient Name: Connor Baker Date of Encounter: 04/06/2022  Primary Cardiologist:   Lauree Chandler, MD   Subjective   Still reports 4/10 chest pain, pressure.  No acute SOB.   Inpatient Medications    Scheduled Meds:  aspirin EC  81 mg Oral Daily   atorvastatin  80 mg Oral Daily   clopidogrel  75 mg Oral Daily   levETIRAcetam  500 mg Oral BID   pantoprazole  40 mg Oral Daily   ranolazine  500 mg Oral BID   sodium chloride flush  3 mL Intravenous Q12H   Continuous Infusions:  PRN Meds: acetaminophen **OR** acetaminophen, melatonin, morphine injection, naLOXone (NARCAN)  injection   Vital Signs    Vitals:   04/05/22 1608 04/05/22 1952 04/06/22 0423 04/06/22 0745  BP: 129/80 105/67 115/76 115/75  Pulse: 61 73 62 68  Resp:  16 19 20  $ Temp: (!) 97.5 F (36.4 C) (!) 97.5 F (36.4 C) (!) 97.4 F (36.3 C) 97.6 F (36.4 C)  TempSrc: Oral Oral Oral Oral  SpO2: 93% 93% 92% 94%  Weight:   89.9 kg   Height:       No intake or output data in the 24 hours ending 04/06/22 0848 Filed Weights   04/04/22 2247 04/05/22 0823 04/06/22 0423  Weight: 88.9 kg 89.3 kg 89.9 kg    Telemetry    NSR - Personally Reviewed  ECG    NA - Personally Reviewed  Physical Exam   GEN: No acute distress.   Neck: No  JVD Cardiac: RRR, no murmurs, rubs, or gallops.  Respiratory: Clear  to auscultation bilaterally. GI: Soft, nontender, non-distended  MS: No  edema; No deformity. Neuro:  Nonfocal  Psych: Normal affect   Labs    Chemistry Recent Labs  Lab 04/04/22 2252 04/05/22 0549 04/06/22 0221  NA 138 139 138  K 3.9 3.5 3.8  CL 107 105 104  CO2 23 26 25  $ GLUCOSE 97 89 115*  BUN 10 8 11  $ CREATININE 0.87 0.87 0.97  CALCIUM 8.5* 8.7* 8.8*  PROT 6.1* 6.6  --   ALBUMIN 3.5 3.7  --   AST 29 32  --   ALT 48* 51*  --   ALKPHOS 56 60  --   BILITOT 0.6 1.1  --   GFRNONAA >60 >60 >60  ANIONGAP 8 8 9     $ Hematology Recent Labs  Lab 04/04/22 2252  04/05/22 0549 04/06/22 0221  WBC 7.5 8.1 7.5  RBC 3.95* 4.02* 4.00*  HGB 13.6 14.0 13.4  HCT 38.3* 39.1 39.4  MCV 97.0 97.3 98.5  MCH 34.4* 34.8* 33.5  MCHC 35.5 35.8 34.0  RDW 12.8 13.1 12.9  PLT 191 181 174    Cardiac EnzymesNo results for input(s): "TROPONINI" in the last 168 hours. No results for input(s): "TROPIPOC" in the last 168 hours.   BNPNo results for input(s): "BNP", "PROBNP" in the last 168 hours.   DDimer No results for input(s): "DDIMER" in the last 168 hours.   Radiology    ECHOCARDIOGRAM COMPLETE  Result Date: 04/05/2022    ECHOCARDIOGRAM REPORT   Patient Name:   Connor Baker Date of Exam: 04/05/2022 Medical Rec #:  [id number]     Height:       70.0 in Accession #:    [id number]    Weight:       196.9 lb Date of Birth:  Aug 17, 1960     BSA:  2.074 m Patient Age:    62 years      BP:           105/70 mmHg Patient Gender: M             HR:           54 bpm. Exam Location:  Inpatient Procedure: 2D Echo, Cardiac Doppler, Color Doppler and Intracardiac            Opacification Agent Indications:    R07.9* Chest pain, unspecified  History:        Patient has prior history of Echocardiogram examinations, most                 recent 05/15/2020. CHF, CAD and Previous Myocardial Infarction;                 Risk Factors:Sleep Apnea, Current Smoker and Dyslipidemia.  Sonographer:    Wilkie Aye RVT RCS Referring Phys: [id number] Rhetta Mura  Sonographer Comments: Suboptimal parasternal window and suboptimal subcostal window. IMPRESSIONS  1. Left ventricular ejection fraction, by estimation, is 60 to 65%. The left ventricle has normal function. The left ventricle has no regional wall motion abnormalities. There is mild left ventricular hypertrophy. Left ventricular diastolic parameters are consistent with Grade I diastolic dysfunction (impaired relaxation).  2. Right ventricular systolic function is normal. The right ventricular size is normal. Tricuspid regurgitation signal is  inadequate for assessing PA pressure.  3. The mitral valve is normal in structure. Trivial mitral valve regurgitation. No evidence of mitral stenosis.  4. The aortic valve is tricuspid. There is mild calcification of the aortic valve. Aortic valve regurgitation is not visualized. No aortic stenosis is present.  5. The inferior vena cava is normal in size with greater than 50% respiratory variability, suggesting right atrial pressure of 3 mmHg. FINDINGS  Left Ventricle: Left ventricular ejection fraction, by estimation, is 60 to 65%. The left ventricle has normal function. The left ventricle has no regional wall motion abnormalities. Definity contrast agent was given IV to delineate the left ventricular  endocardial borders. The left ventricular internal cavity size was normal in size. There is mild left ventricular hypertrophy. Left ventricular diastolic parameters are consistent with Grade I diastolic dysfunction (impaired relaxation). Right Ventricle: The right ventricular size is normal. No increase in right ventricular wall thickness. Right ventricular systolic function is normal. Tricuspid regurgitation signal is inadequate for assessing PA pressure. The tricuspid regurgitant velocity is 1.51 m/s, and with an assumed right atrial pressure of 3 mmHg, the estimated right ventricular systolic pressure is [id number] mmHg. Left Atrium: Left atrial size was normal in size. Right Atrium: Right atrial size was normal in size. Pericardium: There is no evidence of pericardial effusion. Mitral Valve: The mitral valve is normal in structure. Trivial mitral valve regurgitation. No evidence of mitral valve stenosis. Tricuspid Valve: The tricuspid valve is normal in structure. Tricuspid valve regurgitation is trivial. No evidence of tricuspid stenosis. Aortic Valve: The aortic valve is tricuspid. There is mild calcification of the aortic valve. Aortic valve regurgitation is not visualized. No aortic stenosis is present. Aortic  valve mean gradient measures 3.0 mmHg. Aortic valve peak gradient measures 4.8 mmHg. Aortic valve area, by VTI measures 2.94 cm. Pulmonic Valve: The pulmonic valve was normal in structure. Pulmonic valve regurgitation is not visualized. No evidence of pulmonic stenosis. Aorta: The ascending aorta was not well visualized and the aortic root is normal in size and structure. Venous: The inferior vena cava  is normal in size with greater than 50% respiratory variability, suggesting right atrial pressure of 3 mmHg. IAS/Shunts: No atrial level shunt detected by color flow Doppler.  LEFT VENTRICLE PLAX 2D LVIDd:         4.80 cm   Diastology LVIDs:         3.80 cm   LV e' medial:    5.17 cm/s LV PW:         0.90 cm   LV E/e' medial:  10.4 LV IVS:        1.20 cm   LV e' lateral:   7.45 cm/s LVOT diam:     2.10 cm   LV E/e' lateral: 7.2 LV SV:         72 LV SV Index:   35 LVOT Area:     3.46 cm  RIGHT VENTRICLE RV Basal diam:  3.00 cm RV S prime:     13.80 cm/s TAPSE (M-mode): 2.0 cm LEFT ATRIUM             Index        RIGHT ATRIUM          Index LA diam:        4.30 cm 2.07 cm/m   RA Area:     9.60 cm LA Vol (A2C):   41.6 ml 20.06 ml/m  RA Volume:   18.60 ml 8.97 ml/m LA Vol (A4C):   44.3 ml 21.36 ml/m LA Biplane Vol: 45.4 ml 21.90 ml/m  AORTIC VALVE                    PULMONIC VALVE AV Area (Vmax):    2.68 cm     PV Vmax:       0.92 m/s AV Area (Vmean):   3.04 cm     PV Peak grad:  3.4 mmHg AV Area (VTI):     2.94 cm AV Vmax:           110.00 cm/s AV Vmean:          75.400 cm/s AV VTI:            0.244 m AV Peak Grad:      4.8 mmHg AV Mean Grad:      3.0 mmHg LVOT Vmax:         85.00 cm/s LVOT Vmean:        66.100 cm/s LVOT VTI:          0.207 m LVOT/AV VTI ratio: 0.85  AORTA Ao Root diam: 3.30 cm MITRAL VALVE               TRICUSPID VALVE MV Area (PHT): 2.95 cm    TR Peak grad:   9.1 mmHg MV Decel Time: 257 msec    TR Vmax:        151.00 cm/s MV E velocity: 54.00 cm/s MV A velocity: 68.10 cm/s  SHUNTS MV E/A  ratio:  0.79        Systemic VTI:  0.21 m                            Systemic Diam: 2.10 cm Cherlynn Kaiser MD Electronically signed by Cherlynn Kaiser MD Signature Date/Time: 04/05/2022/2:23:45 PM    Final    CT Angio Chest PE W and/or Wo Contrast  Result Date: 04/05/2022 CLINICAL DATA:  History of DVT with right chest pain EXAM: CT ANGIOGRAPHY CHEST WITH CONTRAST  TECHNIQUE: Multidetector CT imaging of the chest was performed using the standard protocol during bolus administration of intravenous contrast. Multiplanar CT image reconstructions and MIPs were obtained to evaluate the vascular anatomy. RADIATION DOSE REDUCTION: This exam was performed according to the departmental dose-optimization program which includes automated exposure control, adjustment of the mA and/or kV according to patient size and/or use of iterative reconstruction technique. CONTRAST:  20m OMNIPAQUE IOHEXOL 350 MG/ML SOLN COMPARISON:  07/21/2020 FINDINGS: Cardiovascular: No filling defects in the pulmonary arteries to suggest pulmonary emboli. Heart is normal size. Aorta is normal caliber. Diffuse coronary artery calcifications and scattered aortic calcifications. Mediastinum/Nodes: No mediastinal, hilar, or axillary adenopathy. Trachea and esophagus are unremarkable. Thyroid unremarkable. Lungs/Pleura: Dependent atelectasis in the lungs.  No effusions. Upper Abdomen: No acute findings Musculoskeletal: Chest wall soft tissues are unremarkable. No acute bony abnormality. Review of the MIP images confirms the above findings. IMPRESSION: No evidence of pulmonary embolus. Diffuse coronary artery disease. No acute cardiopulmonary disease. Aortic Atherosclerosis (ICD10-I70.0). Electronically Signed   By: KRolm BaptiseM.D.   On: 04/05/2022 00:37   DG Chest Portable 1 View  Result Date: 04/04/2022 CLINICAL DATA:  Chest pain. EXAM: PORTABLE CHEST 1 VIEW COMPARISON:  Chest radiograph dated 12/28/2021. FINDINGS: No focal consolidation, pleural  effusion, or pneumothorax the the cardiac silhouette is within limits. Atherosclerotic calcification of the aortic arch. No acute osseous pathology. IMPRESSION: No active disease. Electronically Signed   By: AAnner CreteM.D.   On: 04/04/2022 23:16    Cardiac Studies   ECHO   1. Left ventricular ejection fraction, by estimation, is 60 to 65%. The  left ventricle has normal function. The left ventricle has no regional  wall motion abnormalities. There is mild left ventricular hypertrophy.  Left ventricular diastolic parameters  are consistent with Grade I diastolic dysfunction (impaired relaxation).   2. Right ventricular systolic function is normal. The right ventricular  size is normal. Tricuspid regurgitation signal is inadequate for assessing  PA pressure.   3. The mitral valve is normal in structure. Trivial mitral valve  regurgitation. No evidence of mitral stenosis.   4. The aortic valve is tricuspid. There is mild calcification of the  aortic valve. Aortic valve regurgitation is not visualized. No aortic  stenosis is present.   5. The inferior vena cava is normal in size with greater than 50%  respiratory variability, suggesting right atrial pressure of 3 mmHg.   Patient Profile     62y.o. male  with a hx of PCI and multiple coronary stents who is being seen 04/05/2022 for the evaluation of chest pain at the request of Dr. RTana Coast   Assessment & Plan    CHEST PAIN: Possible unstable angina.  There is no objective evidence of ischemia but this would be difficult to sort out without cardiac cath. The patient understands that risks included but are not limited to stroke (1 in 1000), death (1 in 150, kidney failure [usually temporary] (1 in 500), bleeding (1 in 200), allergic reaction [possibly serious] (1 in 200).  The patient understands and agrees to proceed.      Risk reduction:  Check lipid profile.  Continue current statin for now.  Low carb for increased trigylcerides.   Start Lovaza.       For questions or updates, please contact CValley ViewPlease consult [email or web address] for contact info under Cardiology/STEMI.   Signed, JMinus Breeding MD  04/06/2022, 8:48 AM

## 2022-04-06 NOTE — Progress Notes (Signed)
[facility]                                                                              Connor Baker, is a 62 y.o. male, DOB - 12-15-1960, [id number] Admit date - 04/04/2022    Outpatient Primary MD for the patient is [facility]  LOS - 0  days  Chief Complaint  Patient presents with   Chest Pain   Shortness of Breath       Brief summary   Patient is a 62 year old male with CAD status post stents, chronic angina on Ranexa, GERD, HLP, OSA on nocturnal CPAP, seizure disorder presented with chest pain.  Patient reports an extensive cardiac history including CAD status post PCI with several stents, undergone coronary angiography in 09/2016.  He is on dual antiplatelet therapy, Ranexa for chronic angina. [facility] and reported that he recently transferred facilities 4 days ago and since the time of his transfer he has not been able to receive his cardiac medications.  Presented with 2 days of substernal nonradiating chest pressure, nonexertional, nonpositional and nonpleuritic, now constant over last 48 hours with mild shortness of breath, dizziness. Previous echo in 04/2020 showed EF of 60 to 65%, no focal WMA, normal right ventricular systolic function. Troponins negative in ED, chest x-ray showed no evidence of acute cardiopulmonary process.  CTA showed no acute PE or infiltrate/edema or pneumothorax. EKG showed sinus rhythm rate 71, no evidence of acute ST-T wave changes suggestive of ischemia. Cardiology consulted  Assessment & Plan    Principal Problem:   Chest pain with underlying history of extensive CAD, PCI, chronic angina -EKG with no acute ST-T wave changes suggestive of ischemia, troponins x 2 negative -CTA showed no acute PE or infiltrates, pulmonary edema or pneumothorax -2D echo showed EF of 60 to 65%, normal left ventricular function, no WMA, G1 DD -Continue aspirin, Plavix, Lipitor, Ranexa.  Still complaining of chest pain  4-5/10. -Cardiology consulted, plan for cardiac cath   Active Problems:   HLD (hyperlipidemia) -Continue statin, lipid panel showed cholesterol 116, HDL 39, LDL 35, triglycerides 208 -Start on Lovaza    GERD (gastroesophageal reflux disease) -Continue Protonix    OSA (obstructive sleep apnea) -Continue CPAP qhs    History of seizures -Continue Keppra   Code Status: Full CODE STATUS DVT Prophylaxis:  SCDs Start: 04/05/22 0515   Level of Care: Level of care: Telemetry Cardiac Family Communication: Updated patient Disposition Plan:      Remains inpatient appropriate: Plan for cardiac cath likely tomorrow   Procedures:  2D echo  Consultants:   Cardiology  Antimicrobials: None    Medications  angioplasty book   Does not apply Once   aspirin EC  81 mg Oral Daily   atorvastatin  80 mg Oral Daily   clopidogrel  75 mg Oral Daily   levETIRAcetam  500 mg Oral BID   omega-3 acid ethyl esters  1 g Oral BID   pantoprazole  40 mg Oral Daily   ranolazine  500 mg Oral BID   sodium chloride flush  3 mL Intravenous Q12H  Subjective:   Connor Baker was seen and examined today.  Continues to complain of chest pain, 4/10, no dizziness, lightheadedness, shortness of breath   Objective:   Vitals:   04/05/22 1608 04/05/22 1952 04/06/22 0423 04/06/22 0745  BP: 129/80 105/67 115/76 115/75  Pulse: 61 73 62 68  Resp:  16 19 20  $ Temp: (!) 97.5 F (36.4 C) (!) 97.5 F (36.4 C) (!) 97.4 F (36.3 C) 97.6 F (36.4 C)  TempSrc: Oral Oral Oral Oral  SpO2: 93% 93% 92% 94%  Weight:   89.9 kg   Height:       No intake or output data in the 24 hours ending 04/06/22 1240    Wt Readings from Last 3 Encounters:  04/06/22 89.9 kg  12/28/21 95.3 kg  11/26/20 95.3 kg    Physical Exam General: Alert and oriented x 3, NAD Cardiovascular: S1 S2 clear, RRR.  Respiratory: CTAB, no wheezing Gastrointestinal: Soft, nontender, nondistended, NBS Ext: no pedal edema  bilaterally Neuro: no new deficits Psych: Normal affect    Data Reviewed:  I have personally reviewed following labs    CBC Lab Results  Component Value Date   WBC 7.5 04/06/2022   RBC 4.00 (L) 04/06/2022   HGB 13.4 04/06/2022   HCT 39.4 04/06/2022   MCV 98.5 04/06/2022   MCH 33.5 04/06/2022   PLT 174 04/06/2022   MCHC 34.0 04/06/2022   RDW 12.9 04/06/2022   LYMPHSABS 3.5 04/05/2022   MONOABS 0.9 04/05/2022   EOSABS 0.3 04/05/2022   BASOSABS 0.1 [id number]     Last metabolic panel Lab Results  Component Value Date   NA 138 04/06/2022   K 3.8 04/06/2022   CL 104 04/06/2022   CO2 25 04/06/2022   BUN 11 04/06/2022   CREATININE 0.97 04/06/2022   GLUCOSE 115 (H) 04/06/2022   GFRNONAA >60 04/06/2022   GFRAA >60 10/12/2019   CALCIUM 8.8 (L) 04/06/2022   PROT 6.6 04/05/2022   ALBUMIN 3.7 04/05/2022   BILITOT 1.1 04/05/2022   ALKPHOS 60 04/05/2022   AST 32 04/05/2022   ALT 51 (H) 04/05/2022   ANIONGAP 9 04/06/2022    CBG (last 3)  No results for input(s): "GLUCAP" in the last 72 hours.    Coagulation Profile: No results for input(s): "INR", "PROTIME" in the last 168 hours.   Radiology Studies: I have personally reviewed the imaging studies  ECHOCARDIOGRAM COMPLETE  Result Date: 04/05/2022    ECHOCARDIOGRAM REPORT   Patient Name:   Connor Baker Date of Exam: 04/05/2022 Medical Rec #:  [id number]     Height:       70.0 in Accession #:    [id number]    Weight:       196.9 lb Date of Birth:  [birth year]     BSA:          2.074 m Patient Age:    41 years      BP:           105/70 mmHg Patient Gender: M             HR:           54 bpm. Exam Location:  Inpatient Procedure: 2D Echo, Cardiac Doppler, Color Doppler and Intracardiac            Opacification Agent Indications:    R07.9* Chest pain, unspecified  History:        Patient has prior history of Echocardiogram examinations, most  recent 05/15/2020. CHF, CAD and Previous Myocardial Infarction;                  Risk Factors:Sleep Apnea, Current Smoker and Dyslipidemia.  Sonographer:    Wilkie Aye RVT RCS Referring Phys: [id number] Rhetta Mura  Sonographer Comments: Suboptimal parasternal window and suboptimal subcostal window. IMPRESSIONS  1. Left ventricular ejection fraction, by estimation, is 60 to 65%. The left ventricle has normal function. The left ventricle has no regional wall motion abnormalities. There is mild left ventricular hypertrophy. Left ventricular diastolic parameters are consistent with Grade I diastolic dysfunction (impaired relaxation).  2. Right ventricular systolic function is normal. The right ventricular size is normal. Tricuspid regurgitation signal is inadequate for assessing PA pressure.  3. The mitral valve is normal in structure. Trivial mitral valve regurgitation. No evidence of mitral stenosis.  4. The aortic valve is tricuspid. There is mild calcification of the aortic valve. Aortic valve regurgitation is not visualized. No aortic stenosis is present.  5. The inferior vena cava is normal in size with greater than 50% respiratory variability, suggesting right atrial pressure of 3 mmHg. FINDINGS  Left Ventricle: Left ventricular ejection fraction, by estimation, is 60 to 65%. The left ventricle has normal function. The left ventricle has no regional wall motion abnormalities. Definity contrast agent was given IV to delineate the left ventricular  endocardial borders. The left ventricular internal cavity size was normal in size. There is mild left ventricular hypertrophy. Left ventricular diastolic parameters are consistent with Grade I diastolic dysfunction (impaired relaxation). Right Ventricle: The right ventricular size is normal. No increase in right ventricular wall thickness. Right ventricular systolic function is normal. Tricuspid regurgitation signal is inadequate for assessing PA pressure. The tricuspid regurgitant velocity is 1.51 m/s, and with an assumed right atrial  pressure of 3 mmHg, the estimated right ventricular systolic pressure is [id number] mmHg. Left Atrium: Left atrial size was normal in size. Right Atrium: Right atrial size was normal in size. Pericardium: There is no evidence of pericardial effusion. Mitral Valve: The mitral valve is normal in structure. Trivial mitral valve regurgitation. No evidence of mitral valve stenosis. Tricuspid Valve: The tricuspid valve is normal in structure. Tricuspid valve regurgitation is trivial. No evidence of tricuspid stenosis. Aortic Valve: The aortic valve is tricuspid. There is mild calcification of the aortic valve. Aortic valve regurgitation is not visualized. No aortic stenosis is present. Aortic valve mean gradient measures 3.0 mmHg. Aortic valve peak gradient measures 4.8 mmHg. Aortic valve area, by VTI measures 2.94 cm. Pulmonic Valve: The pulmonic valve was normal in structure. Pulmonic valve regurgitation is not visualized. No evidence of pulmonic stenosis. Aorta: The ascending aorta was not well visualized and the aortic root is normal in size and structure. Venous: The inferior vena cava is normal in size with greater than 50% respiratory variability, suggesting right atrial pressure of 3 mmHg. IAS/Shunts: No atrial level shunt detected by color flow Doppler.  LEFT VENTRICLE PLAX 2D LVIDd:         4.80 cm   Diastology LVIDs:         3.80 cm   LV e' medial:    5.17 cm/s LV PW:         0.90 cm   LV E/e' medial:  10.4 LV IVS:        1.20 cm   LV e' lateral:   7.45 cm/s LVOT diam:     2.10 cm   LV E/e' lateral: 7.2 LV SV:  72 LV SV Index:   35 LVOT Area:     3.46 cm  RIGHT VENTRICLE RV Basal diam:  3.00 cm RV S prime:     13.80 cm/s TAPSE (M-mode): 2.0 cm LEFT ATRIUM             Index        RIGHT ATRIUM          Index LA diam:        4.30 cm 2.07 cm/m   RA Area:     9.60 cm LA Vol (A2C):   41.6 ml 20.06 ml/m  RA Volume:   18.60 ml 8.97 ml/m LA Vol (A4C):   44.3 ml 21.36 ml/m LA Biplane Vol: 45.4 ml 21.90 ml/m   AORTIC VALVE                    PULMONIC VALVE AV Area (Vmax):    2.68 cm     PV Vmax:       0.92 m/s AV Area (Vmean):   3.04 cm     PV Peak grad:  3.4 mmHg AV Area (VTI):     2.94 cm AV Vmax:           110.00 cm/s AV Vmean:          75.400 cm/s AV VTI:            0.244 m AV Peak Grad:      4.8 mmHg AV Mean Grad:      3.0 mmHg LVOT Vmax:         85.00 cm/s LVOT Vmean:        66.100 cm/s LVOT VTI:          0.207 m LVOT/AV VTI ratio: 0.85  AORTA Ao Root diam: 3.30 cm MITRAL VALVE               TRICUSPID VALVE MV Area (PHT): 2.95 cm    TR Peak grad:   9.1 mmHg MV Decel Time: 257 msec    TR Vmax:        151.00 cm/s MV E velocity: 54.00 cm/s MV A velocity: 68.10 cm/s  SHUNTS MV E/A ratio:  0.79        Systemic VTI:  0.21 m                            Systemic Diam: 2.10 cm Cherlynn Kaiser MD Electronically signed by Cherlynn Kaiser MD Signature Date/Time: 04/05/2022/2:23:45 PM    Final    CT Angio Chest PE W and/or Wo Contrast  Result Date: 04/05/2022 CLINICAL DATA:  History of DVT with right chest pain EXAM: CT ANGIOGRAPHY CHEST WITH CONTRAST TECHNIQUE: Multidetector CT imaging of the chest was performed using the standard protocol during bolus administration of intravenous contrast. Multiplanar CT image reconstructions and MIPs were obtained to evaluate the vascular anatomy. RADIATION DOSE REDUCTION: This exam was performed according to the departmental dose-optimization program which includes automated exposure control, adjustment of the mA and/or kV according to patient size and/or use of iterative reconstruction technique. CONTRAST:  74m OMNIPAQUE IOHEXOL 350 MG/ML SOLN COMPARISON:  07/21/2020 FINDINGS: Cardiovascular: No filling defects in the pulmonary arteries to suggest pulmonary emboli. Heart is normal size. Aorta is normal caliber. Diffuse coronary artery calcifications and scattered aortic calcifications. Mediastinum/Nodes: No mediastinal, hilar, or axillary adenopathy. Trachea and esophagus are  unremarkable. Thyroid unremarkable. Lungs/Pleura: Dependent atelectasis in the lungs.  No effusions.  Upper Abdomen: No acute findings Musculoskeletal: Chest wall soft tissues are unremarkable. No acute bony abnormality. Review of the MIP images confirms the above findings. IMPRESSION: No evidence of pulmonary embolus. Diffuse coronary artery disease. No acute cardiopulmonary disease. Aortic Atherosclerosis (ICD10-I70.0). Electronically Signed   By: Rolm Baptise M.D.   On: 04/05/2022 00:37   DG Chest Portable 1 View  Result Date: 04/04/2022 CLINICAL DATA:  Chest pain. EXAM: PORTABLE CHEST 1 VIEW COMPARISON:  Chest radiograph dated 12/28/2021. FINDINGS: No focal consolidation, pleural effusion, or pneumothorax the the cardiac silhouette is within limits. Atherosclerotic calcification of the aortic arch. No acute osseous pathology. IMPRESSION: No active disease. Electronically Signed   By: Anner Crete M.D.   On: 04/04/2022 23:16       Anjenette Gerbino M.D. [facility] 04/06/2022, 12:40 PM  Available via Epic secure chat 7am-7pm After 7 pm, please refer to night coverage provider listed on amion.

## 2022-04-07 ENCOUNTER — Encounter (HOSPITAL_COMMUNITY): Payer: Self-pay | Admitting: Cardiovascular Disease

## 2022-04-07 ENCOUNTER — Encounter (HOSPITAL_COMMUNITY): Admission: EM | Payer: Self-pay | Attending: Internal Medicine

## 2022-04-07 DIAGNOSIS — R072 Precordial pain: Secondary | ICD-10-CM

## 2022-04-07 DIAGNOSIS — I2510 Atherosclerotic heart disease of native coronary artery without angina pectoris: Secondary | ICD-10-CM

## 2022-04-07 HISTORY — PX: LEFT HEART CATH AND CORONARY ANGIOGRAPHY: CATH118249

## 2022-04-07 LAB — BASIC METABOLIC PANEL
Anion gap: 8 (ref 5–15)
BUN: 17 mg/dL (ref 8–23)
CO2: 24 mmol/L (ref 22–32)
Calcium: 8.7 mg/dL — ABNORMAL LOW (ref 8.9–10.3)
Chloride: 104 mmol/L (ref 98–111)
Creatinine, Ser: 1.22 mg/dL (ref 0.61–1.24)
GFR, Estimated: >60 mL/min (ref >60)
Glucose, Bld: 91 mg/dL (ref 70–99)
Potassium: 3.9 mmol/L (ref 3.5–5.1)
Sodium: 136 mmol/L (ref 135–145)

## 2022-04-07 LAB — TSH: TSH: 7.48 u[IU]/mL — ABNORMAL HIGH (ref 0.350–4.500)

## 2022-04-07 LAB — CBC
HCT: 38.1 % — ABNORMAL LOW (ref 39.0–52.0)
Hemoglobin: 13.2 g/dL (ref 13.0–17.0)
MCH: 33.9 pg (ref 26.0–34.0)
MCHC: 34.6 g/dL (ref 30.0–36.0)
MCV: 97.9 fL (ref 80.0–100.0)
Platelets: 164 10*3/uL (ref 150–400)
RBC: 3.89 MIL/uL — ABNORMAL LOW (ref 4.22–5.81)
RDW: 12.7 % (ref 11.5–15.5)
WBC: 8.3 10*3/uL (ref 4.0–10.5)
nRBC: 0 % (ref 0.0–0.2)

## 2022-04-07 LAB — HEMOGLOBIN A1C
Hgb A1c MFr Bld: 5.2 % (ref 4.8–5.6)
Mean Plasma Glucose: 102.54 mg/dL

## 2022-04-07 SURGERY — LEFT HEART CATH AND CORONARY ANGIOGRAPHY
Anesthesia: LOCAL

## 2022-04-07 MED ORDER — ONDANSETRON HCL 4 MG/2ML IJ SOLN: 4.0000 mg | Freq: Four times a day (QID) | INTRAMUSCULAR | Status: DC | PRN

## 2022-04-07 MED ORDER — ACETAMINOPHEN 325 MG PO TABS
650.0000 mg | ORAL_TABLET | ORAL | Status: DC | PRN
  Filled 2022-04-07: qty 2

## 2022-04-07 MED ORDER — ASPIRIN 81 MG PO CHEW
81.0000 mg | CHEWABLE_TABLET | Freq: Every day | ORAL | Status: DC
  Administered 2022-04-07 – 2022-04-08 (×2): 81 mg via ORAL
  Filled 2022-04-07 (×2): qty 1

## 2022-04-07 MED ORDER — MIDAZOLAM HCL 2 MG/2ML IJ SOLN
INTRAMUSCULAR | Status: AC
  Filled 2022-04-07: qty 2

## 2022-04-07 MED ORDER — HEPARIN (PORCINE) IN NACL 1000-0.9 UT/500ML-% IV SOLN
INTRAVENOUS | Status: DC | PRN
  Administered 2022-04-07 (×2): 500 mL

## 2022-04-07 MED ORDER — FENTANYL CITRATE (PF) 100 MCG/2ML IJ SOLN
INTRAMUSCULAR | Status: AC
  Filled 2022-04-07: qty 2

## 2022-04-07 MED ORDER — MORPHINE SULFATE (PF) 2 MG/ML IV SOLN
2.0000 mg | INTRAVENOUS | Status: DC | PRN
  Administered 2022-04-07 – 2022-04-08 (×3): 2 mg via INTRAVENOUS
  Filled 2022-04-07 (×3): qty 1

## 2022-04-07 MED ORDER — SODIUM CHLORIDE 0.9 % IV SOLN: 250.0000 mL | INTRAVENOUS | Status: DC | PRN

## 2022-04-07 MED ORDER — LABETALOL HCL 5 MG/ML IV SOLN: 10.0000 mg | INTRAVENOUS | Status: AC | PRN

## 2022-04-07 MED ORDER — HEPARIN (PORCINE) IN NACL 1000-0.9 UT/500ML-% IV SOLN
INTRAVENOUS | Status: AC
  Filled 2022-04-07: qty 500

## 2022-04-07 MED ORDER — VERAPAMIL HCL 2.5 MG/ML IV SOLN
INTRAVENOUS | Status: AC
  Filled 2022-04-07: qty 2

## 2022-04-07 MED ORDER — SODIUM CHLORIDE 0.9% FLUSH: 3.0000 mL | INTRAVENOUS | Status: DC | PRN

## 2022-04-07 MED ORDER — FENTANYL CITRATE (PF) 100 MCG/2ML IJ SOLN
INTRAMUSCULAR | Status: DC | PRN
  Administered 2022-04-07: 25 ug via INTRAVENOUS

## 2022-04-07 MED ORDER — SODIUM CHLORIDE 0.9% FLUSH: 3.0000 mL | Freq: Two times a day (BID) | INTRAVENOUS | Status: DC

## 2022-04-07 MED ORDER — LIDOCAINE HCL (PF) 1 % IJ SOLN
INTRAMUSCULAR | Status: DC | PRN
  Administered 2022-04-07: 10 mL

## 2022-04-07 MED ORDER — ATORVASTATIN CALCIUM 80 MG PO TABS: 80.0000 mg | ORAL_TABLET | Freq: Every day | ORAL | Status: DC

## 2022-04-07 MED ORDER — HYDRALAZINE HCL 20 MG/ML IJ SOLN: 10.0000 mg | INTRAMUSCULAR | Status: AC | PRN

## 2022-04-07 MED ORDER — LIDOCAINE HCL (PF) 1 % IJ SOLN
INTRAMUSCULAR | Status: AC
  Filled 2022-04-07: qty 30

## 2022-04-07 MED ORDER — IOHEXOL 350 MG/ML SOLN
INTRAVENOUS | Status: DC | PRN
  Administered 2022-04-07: 75 mL

## 2022-04-07 MED ORDER — SODIUM CHLORIDE 0.9 % IV SOLN: INTRAVENOUS | Status: AC

## 2022-04-07 MED ORDER — MIDAZOLAM HCL 2 MG/2ML IJ SOLN
INTRAMUSCULAR | Status: DC | PRN
  Administered 2022-04-07: 1 mg via INTRAVENOUS

## 2022-04-07 SURGICAL SUPPLY — 13 items
CATH INFINITI 5FR MULTPACK ANG (CATHETERS) IMPLANT
CATH INFINITI JR4 5F (CATHETERS) IMPLANT
CLOSURE MYNX CONTROL 5F (Vascular Products) IMPLANT
GUIDEWIRE INQWIRE 1.5J.035X260 (WIRE) IMPLANT
INQWIRE 1.5J .035X260CM (WIRE) ×1
KIT HEART LEFT (KITS) ×1 IMPLANT
PACK CARDIAC CATHETERIZATION (CUSTOM PROCEDURE TRAY) ×1 IMPLANT
SHEATH PINNACLE 5F 10CM (SHEATH) IMPLANT
SYR MEDRAD MARK 7 150ML (SYRINGE) ×1 IMPLANT
TRANSDUCER W/STOPCOCK (MISCELLANEOUS) ×1 IMPLANT
TUBING CIL FLEX 10 FLL-RA (TUBING) ×1 IMPLANT
WIRE EMERALD 3MM-J .035X150CM (WIRE) IMPLANT
WIRE HI TORQ VERSACORE-J 145CM (WIRE) IMPLANT

## 2022-04-07 NOTE — Progress Notes (Addendum)
Rounding Note    Patient Name: Connor Baker Date of Encounter: 04/07/2022  [place] Cardiologist: Lauree Chandler, MD   Subjective   He states he is still have central chest pain, pain never goes away, sometimes feels SOB, able to lie flat.  He is urinating without issue.   Inpatient Medications    Scheduled Meds:  aspirin EC  81 mg Oral Daily   atorvastatin  80 mg Oral Daily   clopidogrel  75 mg Oral Daily   levETIRAcetam  500 mg Oral BID   omega-3 acid ethyl esters  1 g Oral BID   pantoprazole  40 mg Oral Daily   ranolazine  500 mg Oral BID   sodium chloride flush  3 mL Intravenous Q12H   Continuous Infusions:  sodium chloride     sodium chloride 1 mL/kg/hr (04/07/22 0045)   PRN Meds: sodium chloride, acetaminophen **OR** acetaminophen, melatonin, morphine injection, naLOXone (NARCAN)  injection, sodium chloride flush   Vital Signs    Vitals:   04/06/22 0745 04/06/22 1646 04/06/22 2014 04/07/22 0412  BP: 115/75 99/76 105/77 110/80  Pulse: 68 60 64 60  Resp: 20 18 19 18  $ Temp: 97.6 F (36.4 C) 97.6 F (36.4 C) 98.3 F (36.8 C) 98.2 F (36.8 C)  TempSrc: Oral Oral Oral Oral  SpO2: 94% 94% 93% 94%  Weight:    89.1 kg  Height:       No intake or output data in the 24 hours ending 04/07/22 0634    04/07/2022    4:12 AM 04/06/2022    4:23 AM 04/05/2022    8:23 AM  Last 3 Weights  Weight (lbs) 196 lb 6.9 oz 198 lb 3.1 oz 196 lb 14.4 oz  Weight (kg) 89.1 kg 89.9 kg 89.313 kg      Telemetry    Sinus rhythm  - Personally Reviewed  ECG    No new EKG today - Personally Reviewed  Physical Exam   GEN: No acute distress.   Neck: No JVD Cardiac: RRR, no murmurs, rubs, or gallops.  Respiratory: Clear to auscultation bilaterally. GI: Soft, non-distended  MS: No leg edema; Radial pulse 2+ bilaterally  Neuro:  Nonfocal  Psych: Normal affect   Labs    High Sensitivity Troponin:   Recent Labs  Lab 04/04/22 2252 04/05/22 0230  04/05/22 0927  TROPONINIHS 4 4 5     $ Chemistry Recent Labs  Lab 04/04/22 2252 04/05/22 0549 04/06/22 0221 04/07/22 0236  NA 138 139 138 136  K 3.9 3.5 3.8 3.9  CL 107 105 104 104  CO2 23 26 25 24  $ GLUCOSE 97 89 115* 91  BUN 10 8 11 17  $ CREATININE 0.87 0.87 0.97 1.22  CALCIUM 8.5* 8.7* 8.8* 8.7*  MG  --  2.1  --   --   PROT 6.1* 6.6  --   --   ALBUMIN 3.5 3.7  --   --   AST 29 32  --   --   ALT 48* 51*  --   --   ALKPHOS 56 60  --   --   BILITOT 0.6 1.1  --   --   GFRNONAA >60 >60 >60 >60  ANIONGAP 8 8 9 8    $ Lipids  Recent Labs  Lab 04/06/22 0221  CHOL 116  TRIG 208*  HDL 39*  LDLCALC 35  CHOLHDL 3.0    Hematology Recent Labs  Lab 04/05/22 0549 04/06/22 0221 04/07/22 0236  WBC 8.1 7.5 8.3  RBC 4.02* 4.00* 3.89*  HGB 14.0 13.4 13.2  HCT 39.1 39.4 38.1*  MCV 97.3 98.5 97.9  MCH 34.8* 33.5 33.9  MCHC 35.8 34.0 34.6  RDW 13.1 12.9 12.7  PLT 181 174 164   Thyroid No results for input(s): "TSH", "FREET4" in the last 168 hours.  BNPNo results for input(s): "BNP", "PROBNP" in the last 168 hours.  DDimer No results for input(s): "DDIMER" in the last 168 hours.   Radiology    ECHOCARDIOGRAM COMPLETE  Result Date: 04/05/2022    ECHOCARDIOGRAM REPORT   Patient Name:   MOISHE LARCHER Date of Exam: 04/05/2022 Medical Rec #:  [id number]     Height:       70.0 in Accession #:    [id number]    Weight:       196.9 lb Date of Birth:  29-Sep-1960     BSA:          2.074 m Patient Age:    30 years      BP:           105/70 mmHg Patient Gender: M             HR:           54 bpm. Exam Location:  Inpatient Procedure: 2D Echo, Cardiac Doppler, Color Doppler and Intracardiac            Opacification Agent Indications:    R07.9* Chest pain, unspecified  History:        Patient has prior history of Echocardiogram examinations, most                 recent 05/15/2020. CHF, CAD and Previous Myocardial Infarction;                 Risk Factors:Sleep Apnea, Current Smoker and Dyslipidemia.   Sonographer:    Wilkie Aye RVT RCS Referring Phys: [id number] Rhetta Mura  Sonographer Comments: Suboptimal parasternal window and suboptimal subcostal window. IMPRESSIONS  1. Left ventricular ejection fraction, by estimation, is 60 to 65%. The left ventricle has normal function. The left ventricle has no regional wall motion abnormalities. There is mild left ventricular hypertrophy. Left ventricular diastolic parameters are consistent with Grade I diastolic dysfunction (impaired relaxation).  2. Right ventricular systolic function is normal. The right ventricular size is normal. Tricuspid regurgitation signal is inadequate for assessing PA pressure.  3. The mitral valve is normal in structure. Trivial mitral valve regurgitation. No evidence of mitral stenosis.  4. The aortic valve is tricuspid. There is mild calcification of the aortic valve. Aortic valve regurgitation is not visualized. No aortic stenosis is present.  5. The inferior vena cava is normal in size with greater than 50% respiratory variability, suggesting right atrial pressure of 3 mmHg. FINDINGS  Left Ventricle: Left ventricular ejection fraction, by estimation, is 60 to 65%. The left ventricle has normal function. The left ventricle has no regional wall motion abnormalities. Definity contrast agent was given IV to delineate the left ventricular  endocardial borders. The left ventricular internal cavity size was normal in size. There is mild left ventricular hypertrophy. Left ventricular diastolic parameters are consistent with Grade I diastolic dysfunction (impaired relaxation). Right Ventricle: The right ventricular size is normal. No increase in right ventricular wall thickness. Right ventricular systolic function is normal. Tricuspid regurgitation signal is inadequate for assessing PA pressure. The tricuspid regurgitant velocity is 1.51 m/s, and with an assumed right atrial pressure of  3 mmHg, the estimated right ventricular systolic pressure  is [id number] mmHg. Left Atrium: Left atrial size was normal in size. Right Atrium: Right atrial size was normal in size. Pericardium: There is no evidence of pericardial effusion. Mitral Valve: The mitral valve is normal in structure. Trivial mitral valve regurgitation. No evidence of mitral valve stenosis. Tricuspid Valve: The tricuspid valve is normal in structure. Tricuspid valve regurgitation is trivial. No evidence of tricuspid stenosis. Aortic Valve: The aortic valve is tricuspid. There is mild calcification of the aortic valve. Aortic valve regurgitation is not visualized. No aortic stenosis is present. Aortic valve mean gradient measures 3.0 mmHg. Aortic valve peak gradient measures 4.8 mmHg. Aortic valve area, by VTI measures 2.94 cm. Pulmonic Valve: The pulmonic valve was normal in structure. Pulmonic valve regurgitation is not visualized. No evidence of pulmonic stenosis. Aorta: The ascending aorta was not well visualized and the aortic root is normal in size and structure. Venous: The inferior vena cava is normal in size with greater than 50% respiratory variability, suggesting right atrial pressure of 3 mmHg. IAS/Shunts: No atrial level shunt detected by color flow Doppler.  LEFT VENTRICLE PLAX 2D LVIDd:         4.80 cm   Diastology LVIDs:         3.80 cm   LV e' medial:    5.17 cm/s LV PW:         0.90 cm   LV E/e' medial:  10.4 LV IVS:        1.20 cm   LV e' lateral:   7.45 cm/s LVOT diam:     2.10 cm   LV E/e' lateral: 7.2 LV SV:         72 LV SV Index:   35 LVOT Area:     3.46 cm  RIGHT VENTRICLE RV Basal diam:  3.00 cm RV S prime:     13.80 cm/s TAPSE (M-mode): 2.0 cm LEFT ATRIUM             Index        RIGHT ATRIUM          Index LA diam:        4.30 cm 2.07 cm/m   RA Area:     9.60 cm LA Vol (A2C):   41.6 ml 20.06 ml/m  RA Volume:   18.60 ml 8.97 ml/m LA Vol (A4C):   44.3 ml 21.36 ml/m LA Biplane Vol: 45.4 ml 21.90 ml/m  AORTIC VALVE                    PULMONIC VALVE AV Area (Vmax):    2.68  cm     PV Vmax:       0.92 m/s AV Area (Vmean):   3.04 cm     PV Peak grad:  3.4 mmHg AV Area (VTI):     2.94 cm AV Vmax:           110.00 cm/s AV Vmean:          75.400 cm/s AV VTI:            0.244 m AV Peak Grad:      4.8 mmHg AV Mean Grad:      3.0 mmHg LVOT Vmax:         85.00 cm/s LVOT Vmean:        66.100 cm/s LVOT VTI:          0.207 m LVOT/AV VTI ratio: 0.85  AORTA Ao Root diam: 3.30 cm MITRAL VALVE               TRICUSPID VALVE MV Area (PHT): 2.95 cm    TR Peak grad:   9.1 mmHg MV Decel Time: 257 msec    TR Vmax:        151.00 cm/s MV E velocity: 54.00 cm/s MV A velocity: 68.10 cm/s  SHUNTS MV E/A ratio:  0.79        Systemic VTI:  0.21 m                            Systemic Diam: 2.10 cm Cherlynn Kaiser MD Electronically signed by Cherlynn Kaiser MD Signature Date/Time: 04/05/2022/2:23:45 PM    Final     Cardiac Studies   Echocardiogram from 04/05/2022:    1. Left ventricular ejection fraction, by estimation, is 60 to 65%. The  left ventricle has normal function. The left ventricle has no regional  wall motion abnormalities. There is mild left ventricular hypertrophy.  Left ventricular diastolic parameters  are consistent with Grade I diastolic dysfunction (impaired relaxation).   2. Right ventricular systolic function is normal. The right ventricular  size is normal. Tricuspid regurgitation signal is inadequate for assessing  PA pressure.   3. The mitral valve is normal in structure. Trivial mitral valve  regurgitation. No evidence of mitral stenosis.   4. The aortic valve is tricuspid. There is mild calcification of the  aortic valve. Aortic valve regurgitation is not visualized. No aortic  stenosis is present.   5. The inferior vena cava is normal in size with greater than 50%  respiratory variability, suggesting right atrial pressure of 3 mmHg.    Left heart catheterization 10/24/2016:  1st RPLB lesion, 100 %stenosed. Post Atrio-1 lesion, 50 %stenosed. Mid RCA to Dist RCA  lesion, 5 %stenosed. Post Atrio-2 lesion, 50 %stenosed. Ost Cx lesion, 25 %stenosed. Mid RCA lesion, 30 %stenosed. Ost LAD to Prox LAD lesion, 40 %stenosed. The left ventricular systolic function is normal. LV end diastolic pressure is normal. The left ventricular ejection fraction is 55-65% by visual estimate.   1. Single vessel obstructive CAD with chronic occlusion of a very small PL branch with left to right collaterals. The prior PTCA site in the proximal PLOM is unchanged from prior with a 50% stenosis. No new disease to explain his ongoing chest pain 2. Normal LV function 3. Normal LVEDP   Plan: I do not see any culprit lesion for his recurrent chest pain. I question whether his pain is anginal. I would continue medical therapy.     Patient Profile     62 y.o. male with PMH of CAD status post stents, chronic angina, GERD, hyperlipidemia, OSA on CPAP, seizure disorder, who is currently admitted for chest pain, cardiology is following with concerning of unstable angina.  LHC is pending today.  Assessment & Plan    Chest pain  CAD with history of multiple distal RCA stents -Presented with chest pain and shortness of breath, not responding to nitroglycerin, off all meds PTA, felt symptoms similar to previous MI (per chart review), symptoms atypical  -High sensitive troponin negative x 3 -EKG without acute changes -CTA chest no acute finding -Will send TSH and A1c today, lipid panel as below -Echocardiogram 04/05/2022 with LVEF 60 to 65%, no RWMA, grade 1 DD, normal RV, trivial MR -Planned for left heart catheterization 04/07/2022, please maintain NPO, continue per-cardiac cath IV  fluids hydration, monitor renal index closely (noted uptrend Cr) -Continue current medical therapy with aspirin, Plavix, Lipitor, Ranexa; not on BB likely due to baseline bradycardia  Hyperlipidemia -LDL 35, triglyceride 208 -Resumed Lipitor 60m and Lovaza at admission  Seizure  disorder GERD OSA -Per primary team  For questions or updates, please contact [place]Please consult [email or web address] for contact info under     Signed, XMargie Billet NP  04/07/2022, 6:34 AM   As above, patient seen and examined.  He has a history of coronary artery disease with previous PCI, hyperlipidemia, obstructive sleep apnea, seizure disorder admitted with chest pain.  His chest pain has been persistent for 6 days though does improve at times but not completely resolving.  Troponins are normal.  Electrocardiogram shows no ST changes.  Echocardiogram shows normal LV function.  Due to persistent nature of symptoms plan is for cardiac catheterization today per Dr. HPercival Spanish  The risk and benefits including myocardial infarction, CVA and death discussed and he agrees to proceed. BKirk Ruths MD

## 2022-04-07 NOTE — H&P (View-Only) (Signed)
Rounding Note    Patient Name: Connor Baker Date of Encounter: 04/07/2022  [place] Cardiologist: Lauree Chandler, MD   Subjective   He states he is still have central chest pain, pain never goes away, sometimes feels SOB, able to lie flat.  He is urinating without issue.   Inpatient Medications    Scheduled Meds:  aspirin EC  81 mg Oral Daily   atorvastatin  80 mg Oral Daily   clopidogrel  75 mg Oral Daily   levETIRAcetam  500 mg Oral BID   omega-3 acid ethyl esters  1 g Oral BID   pantoprazole  40 mg Oral Daily   ranolazine  500 mg Oral BID   sodium chloride flush  3 mL Intravenous Q12H   Continuous Infusions:  sodium chloride     sodium chloride 1 mL/kg/hr (04/07/22 0045)   PRN Meds: sodium chloride, acetaminophen **OR** acetaminophen, melatonin, morphine injection, naLOXone (NARCAN)  injection, sodium chloride flush   Vital Signs    Vitals:   04/06/22 0745 04/06/22 1646 04/06/22 2014 04/07/22 0412  BP: 115/75 99/76 105/77 110/80  Pulse: 68 60 64 60  Resp: 20 18 19 18  $ Temp: 97.6 F (36.4 C) 97.6 F (36.4 C) 98.3 F (36.8 C) 98.2 F (36.8 C)  TempSrc: Oral Oral Oral Oral  SpO2: 94% 94% 93% 94%  Weight:    89.1 kg  Height:       No intake or output data in the 24 hours ending 04/07/22 0634    04/07/2022    4:12 AM 04/06/2022    4:23 AM 04/05/2022    8:23 AM  Last 3 Weights  Weight (lbs) 196 lb 6.9 oz 198 lb 3.1 oz 196 lb 14.4 oz  Weight (kg) 89.1 kg 89.9 kg 89.313 kg      Telemetry    Sinus rhythm  - Personally Reviewed  ECG    No new EKG today - Personally Reviewed  Physical Exam   GEN: No acute distress.   Neck: No JVD Cardiac: RRR, no murmurs, rubs, or gallops.  Respiratory: Clear to auscultation bilaterally. GI: Soft, non-distended  MS: No leg edema; Radial pulse 2+ bilaterally  Neuro:  Nonfocal  Psych: Normal affect   Labs    High Sensitivity Troponin:   Recent Labs  Lab 04/04/22 2252 04/05/22 0230  04/05/22 0927  TROPONINIHS 4 4 5     $ Chemistry Recent Labs  Lab 04/04/22 2252 04/05/22 0549 04/06/22 0221 04/07/22 0236  NA 138 139 138 136  K 3.9 3.5 3.8 3.9  CL 107 105 104 104  CO2 23 26 25 24  $ GLUCOSE 97 89 115* 91  BUN 10 8 11 17  $ CREATININE 0.87 0.87 0.97 1.22  CALCIUM 8.5* 8.7* 8.8* 8.7*  MG  --  2.1  --   --   PROT 6.1* 6.6  --   --   ALBUMIN 3.5 3.7  --   --   AST 29 32  --   --   ALT 48* 51*  --   --   ALKPHOS 56 60  --   --   BILITOT 0.6 1.1  --   --   GFRNONAA >60 >60 >60 >60  ANIONGAP 8 8 9 8    $ Lipids  Recent Labs  Lab 04/06/22 0221  CHOL 116  TRIG 208*  HDL 39*  LDLCALC 35  CHOLHDL 3.0    Hematology Recent Labs  Lab 04/05/22 0549 04/06/22 0221 04/07/22 0236  WBC 8.1 7.5 8.3  RBC 4.02* 4.00* 3.89*  HGB 14.0 13.4 13.2  HCT 39.1 39.4 38.1*  MCV 97.3 98.5 97.9  MCH 34.8* 33.5 33.9  MCHC 35.8 34.0 34.6  RDW 13.1 12.9 12.7  PLT 181 174 164   Thyroid No results for input(s): "TSH", "FREET4" in the last 168 hours.  BNPNo results for input(s): "BNP", "PROBNP" in the last 168 hours.  DDimer No results for input(s): "DDIMER" in the last 168 hours.   Radiology    ECHOCARDIOGRAM COMPLETE  Result Date: 04/05/2022    ECHOCARDIOGRAM REPORT   Patient Name:   Connor Baker Date of Exam: 04/05/2022 Medical Rec #:  [id number]     Height:       70.0 in Accession #:    [id number]    Weight:       196.9 lb Date of Birth:  12-01-1960     BSA:          2.074 m Patient Age:    62 years      BP:           105/70 mmHg Patient Gender: M             HR:           54 bpm. Exam Location:  Inpatient Procedure: 2D Echo, Cardiac Doppler, Color Doppler and Intracardiac            Opacification Agent Indications:    R07.9* Chest pain, unspecified  History:        Patient has prior history of Echocardiogram examinations, most                 recent 05/15/2020. CHF, CAD and Previous Myocardial Infarction;                 Risk Factors:Sleep Apnea, Current Smoker and Dyslipidemia.   Sonographer:    Wilkie Aye RVT RCS Referring Phys: [id number] Rhetta Mura  Sonographer Comments: Suboptimal parasternal window and suboptimal subcostal window. IMPRESSIONS  1. Left ventricular ejection fraction, by estimation, is 60 to 65%. The left ventricle has normal function. The left ventricle has no regional wall motion abnormalities. There is mild left ventricular hypertrophy. Left ventricular diastolic parameters are consistent with Grade I diastolic dysfunction (impaired relaxation).  2. Right ventricular systolic function is normal. The right ventricular size is normal. Tricuspid regurgitation signal is inadequate for assessing PA pressure.  3. The mitral valve is normal in structure. Trivial mitral valve regurgitation. No evidence of mitral stenosis.  4. The aortic valve is tricuspid. There is mild calcification of the aortic valve. Aortic valve regurgitation is not visualized. No aortic stenosis is present.  5. The inferior vena cava is normal in size with greater than 50% respiratory variability, suggesting right atrial pressure of 3 mmHg. FINDINGS  Left Ventricle: Left ventricular ejection fraction, by estimation, is 60 to 65%. The left ventricle has normal function. The left ventricle has no regional wall motion abnormalities. Definity contrast agent was given IV to delineate the left ventricular  endocardial borders. The left ventricular internal cavity size was normal in size. There is mild left ventricular hypertrophy. Left ventricular diastolic parameters are consistent with Grade I diastolic dysfunction (impaired relaxation). Right Ventricle: The right ventricular size is normal. No increase in right ventricular wall thickness. Right ventricular systolic function is normal. Tricuspid regurgitation signal is inadequate for assessing PA pressure. The tricuspid regurgitant velocity is 1.51 m/s, and with an assumed right atrial pressure of  3 mmHg, the estimated right ventricular systolic pressure  is [id number] mmHg. Left Atrium: Left atrial size was normal in size. Right Atrium: Right atrial size was normal in size. Pericardium: There is no evidence of pericardial effusion. Mitral Valve: The mitral valve is normal in structure. Trivial mitral valve regurgitation. No evidence of mitral valve stenosis. Tricuspid Valve: The tricuspid valve is normal in structure. Tricuspid valve regurgitation is trivial. No evidence of tricuspid stenosis. Aortic Valve: The aortic valve is tricuspid. There is mild calcification of the aortic valve. Aortic valve regurgitation is not visualized. No aortic stenosis is present. Aortic valve mean gradient measures 3.0 mmHg. Aortic valve peak gradient measures 4.8 mmHg. Aortic valve area, by VTI measures 2.94 cm. Pulmonic Valve: The pulmonic valve was normal in structure. Pulmonic valve regurgitation is not visualized. No evidence of pulmonic stenosis. Aorta: The ascending aorta was not well visualized and the aortic root is normal in size and structure. Venous: The inferior vena cava is normal in size with greater than 50% respiratory variability, suggesting right atrial pressure of 3 mmHg. IAS/Shunts: No atrial level shunt detected by color flow Doppler.  LEFT VENTRICLE PLAX 2D LVIDd:         4.80 cm   Diastology LVIDs:         3.80 cm   LV e' medial:    5.17 cm/s LV PW:         0.90 cm   LV E/e' medial:  10.4 LV IVS:        1.20 cm   LV e' lateral:   7.45 cm/s LVOT diam:     2.10 cm   LV E/e' lateral: 7.2 LV SV:         72 LV SV Index:   35 LVOT Area:     3.46 cm  RIGHT VENTRICLE RV Basal diam:  3.00 cm RV S prime:     13.80 cm/s TAPSE (M-mode): 2.0 cm LEFT ATRIUM             Index        RIGHT ATRIUM          Index LA diam:        4.30 cm 2.07 cm/m   RA Area:     9.60 cm LA Vol (A2C):   41.6 ml 20.06 ml/m  RA Volume:   18.60 ml 8.97 ml/m LA Vol (A4C):   44.3 ml 21.36 ml/m LA Biplane Vol: 45.4 ml 21.90 ml/m  AORTIC VALVE                    PULMONIC VALVE AV Area (Vmax):    2.68  cm     PV Vmax:       0.92 m/s AV Area (Vmean):   3.04 cm     PV Peak grad:  3.4 mmHg AV Area (VTI):     2.94 cm AV Vmax:           110.00 cm/s AV Vmean:          75.400 cm/s AV VTI:            0.244 m AV Peak Grad:      4.8 mmHg AV Mean Grad:      3.0 mmHg LVOT Vmax:         85.00 cm/s LVOT Vmean:        66.100 cm/s LVOT VTI:          0.207 m LVOT/AV VTI ratio: 0.85  AORTA Ao Root diam: 3.30 cm MITRAL VALVE               TRICUSPID VALVE MV Area (PHT): 2.95 cm    TR Peak grad:   9.1 mmHg MV Decel Time: 257 msec    TR Vmax:        151.00 cm/s MV E velocity: 54.00 cm/s MV A velocity: 68.10 cm/s  SHUNTS MV E/A ratio:  0.79        Systemic VTI:  0.21 m                            Systemic Diam: 2.10 cm Cherlynn Kaiser MD Electronically signed by Cherlynn Kaiser MD Signature Date/Time: 04/05/2022/2:23:45 PM    Final     Cardiac Studies   Echocardiogram from 04/05/2022:    1. Left ventricular ejection fraction, by estimation, is 60 to 65%. The  left ventricle has normal function. The left ventricle has no regional  wall motion abnormalities. There is mild left ventricular hypertrophy.  Left ventricular diastolic parameters  are consistent with Grade I diastolic dysfunction (impaired relaxation).   2. Right ventricular systolic function is normal. The right ventricular  size is normal. Tricuspid regurgitation signal is inadequate for assessing  PA pressure.   3. The mitral valve is normal in structure. Trivial mitral valve  regurgitation. No evidence of mitral stenosis.   4. The aortic valve is tricuspid. There is mild calcification of the  aortic valve. Aortic valve regurgitation is not visualized. No aortic  stenosis is present.   5. The inferior vena cava is normal in size with greater than 50%  respiratory variability, suggesting right atrial pressure of 3 mmHg.    Left heart catheterization 10/24/2016:  1st RPLB lesion, 100 %stenosed. Post Atrio-1 lesion, 50 %stenosed. Mid RCA to Dist RCA  lesion, 5 %stenosed. Post Atrio-2 lesion, 50 %stenosed. Ost Cx lesion, 25 %stenosed. Mid RCA lesion, 30 %stenosed. Ost LAD to Prox LAD lesion, 40 %stenosed. The left ventricular systolic function is normal. LV end diastolic pressure is normal. The left ventricular ejection fraction is 55-65% by visual estimate.   1. Single vessel obstructive CAD with chronic occlusion of a very small PL branch with left to right collaterals. The prior PTCA site in the proximal PLOM is unchanged from prior with a 50% stenosis. No new disease to explain his ongoing chest pain 2. Normal LV function 3. Normal LVEDP   Plan: I do not see any culprit lesion for his recurrent chest pain. I question whether his pain is anginal. I would continue medical therapy.     Patient Profile     62 y.o. male with PMH of CAD status post stents, chronic angina, GERD, hyperlipidemia, OSA on CPAP, seizure disorder, who is currently admitted for chest pain, cardiology is following with concerning of unstable angina.  LHC is pending today.  Assessment & Plan    Chest pain  CAD with history of multiple distal RCA stents -Presented with chest pain and shortness of breath, not responding to nitroglycerin, off all meds PTA, felt symptoms similar to previous MI (per chart review), symptoms atypical  -High sensitive troponin negative x 3 -EKG without acute changes -CTA chest no acute finding -Will send TSH and A1c today, lipid panel as below -Echocardiogram 04/05/2022 with LVEF 60 to 65%, no RWMA, grade 1 DD, normal RV, trivial MR -Planned for left heart catheterization 04/07/2022, please maintain NPO, continue per-cardiac cath IV  fluids hydration, monitor renal index closely (noted uptrend Cr) -Continue current medical therapy with aspirin, Plavix, Lipitor, Ranexa; not on BB likely due to baseline bradycardia  Hyperlipidemia -LDL 35, triglyceride 208 -Resumed Lipitor 69m and Lovaza at admission  Seizure  disorder GERD OSA -Per primary team  For questions or updates, please contact CGold[place] ValleyPlease consult [email or web address] for contact info under     Signed, XMargie Billet NP  04/07/2022, 6:34 AM   As above, patient seen and examined.  He has a history of coronary artery disease with previous PCI, hyperlipidemia, obstructive sleep apnea, seizure disorder admitted with chest pain.  His chest pain has been persistent for 6 days though does improve at times but not completely resolving.  Troponins are normal.  Electrocardiogram shows no ST changes.  Echocardiogram shows normal LV function.  Due to persistent nature of symptoms plan is for cardiac catheterization today per Dr. HPercival Spanish  The risk and benefits including myocardial infarction, CVA and death discussed and he agrees to proceed. BKirk Ruths MD

## 2022-04-07 NOTE — Progress Notes (Signed)
[facility]                                                                              [place], is a 62 y.o. male, DOB - 1961/02/12, [id number] Admit date - 04/04/2022    Outpatient Primary MD for the [facility] is [facility]  LOS - 1  days  Chief Complaint  Patient presents with   Chest Pain   Shortness of Breath       Brief summary   Patient is a 62 year old male with CAD status post stents, chronic angina on Ranexa, GERD, HLP, OSA on nocturnal CPAP, seizure disorder presented with chest pain.  Patient reports an extensive cardiac history including CAD status post PCI with several stents, undergone coronary angiography in 09/2016.  He is on dual antiplatelet therapy, Ranexa for chronic angina. [facility] and reported that he recently transferred facilities 4 days ago and since the time of his transfer he has not been able to receive his cardiac medications.  Presented with 2 days of substernal nonradiating chest pressure, nonexertional, nonpositional and nonpleuritic, now constant over last 48 hours with mild shortness of breath, dizziness. Previous echo in 04/2020 showed EF of 60 to 65%, no focal WMA, normal right ventricular systolic function. Troponins negative in ED, chest x-ray showed no evidence of acute cardiopulmonary process.  CTA showed no acute PE or infiltrate/edema or pneumothorax. EKG showed sinus rhythm rate 71, no evidence of acute ST-T wave changes suggestive of ischemia. Cardiology consulted  Assessment & Plan    Principal Problem:   Chest pain with underlying history of extensive CAD, PCI, chronic angina -EKG with no acute ST-T wave changes suggestive of ischemia, troponins x 2 negative -CTA showed no acute PE or infiltrates, pulmonary edema or pneumothorax -2D echo showed EF of 60 to 65%, normal left ventricular function, no WMA, G1 DD -Continue aspirin, Plavix, Lipitor, Ranexa.   -Cardiac cath done today, femoral  approach, per Dr. 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  Recommended continued medical therapy."   Active Problems:   HLD (hyperlipidemia) -Continue statin, lipid panel showed cholesterol 116, HDL 39, LDL 35, triglycerides 208 -Started Lovaza, continue Lipitor    GERD (gastroesophageal reflux disease) -Continue Protonix    OSA (obstructive sleep apnea) -Continue CPAP qhs    History of seizures -Continue Keppra   Code Status: Full CODE STATUS DVT Prophylaxis:  SCDs Start: 04/05/22 0515   Level of Care: Level of care: Telemetry Cardiac Family Communication: Updated patient Disposition Plan:      Remains inpatient appropriate: Cardiac cath today, likely DC back to present tomorrow a.m.   Procedures:  2D echo Cardiac cath  Consultants:   Cardiology  Antimicrobials: None    Medications  aspirin  81 mg Oral Daily   atorvastatin  80 mg Oral Daily   clopidogrel  75 mg Oral Daily   levETIRAcetam  500 mg Oral BID   omega-3 acid ethyl esters  1 g  Oral BID   pantoprazole  40 mg Oral Daily   ranolazine  500 mg Oral BID   sodium chloride flush  3 mL Intravenous Q12H   sodium chloride flush  3 mL Intravenous Q12H      Subjective:   Connor Baker was seen and examined today.  Continues to complain of chest pain, 4/10, no dizziness, lightheadedness, shortness of breath   Objective:   Vitals:   04/07/22 1215 04/07/22 1216 04/07/22 1217 04/07/22 1315  BP:    110/79  Pulse: (!) 58 62 60 63  Resp:    16  Temp:    97.8 F (36.6 C)  TempSrc:    Oral  SpO2:    95%  Weight:      Height:        Intake/Output Summary (Last 24 hours) at 04/07/2022 1440 Last data filed at 04/07/2022 0600 Gross per 24 hour  Intake 468.83 ml  Output --  Net 468.83 ml      Wt Readings from Last 3 Encounters:   04/07/22 89.1 kg  12/28/21 95.3 kg  11/26/20 95.3 kg   Physical Exam General: Alert and oriented x 3, NAD Cardiovascular: S1 S2 clear, RRR.  Respiratory: CTAB, no wheezing Gastrointestinal: Soft, nontender, nondistended, NBS Ext: no pedal edema bilaterally Neuro: no new deficits Psych: Normal affect    Data Reviewed:  I have personally reviewed following labs    CBC Lab Results  Component Value Date   WBC 8.3 04/07/2022   RBC 3.89 (L) 04/07/2022   HGB 13.2 04/07/2022   HCT 38.1 (L) 04/07/2022   MCV 97.9 04/07/2022   MCH 33.9 04/07/2022   PLT 164 04/07/2022   MCHC 34.6 04/07/2022   RDW 12.7 04/07/2022   LYMPHSABS 3.5 04/05/2022   MONOABS 0.9 04/05/2022   EOSABS 0.3 04/05/2022   BASOSABS 0.1 [id number]     Last metabolic panel Lab Results  Component Value Date   NA 136 04/07/2022   K 3.9 04/07/2022   CL 104 04/07/2022   CO2 24 04/07/2022   BUN 17 04/07/2022   CREATININE 1.22 04/07/2022   GLUCOSE 91 04/07/2022   GFRNONAA >60 04/07/2022   GFRAA >60 10/12/2019   CALCIUM 8.7 (L) 04/07/2022   PROT 6.6 04/05/2022   ALBUMIN 3.7 04/05/2022   BILITOT 1.1 04/05/2022   ALKPHOS 60 04/05/2022   AST 32 04/05/2022   ALT 51 (H) 04/05/2022   ANIONGAP 8 04/07/2022    CBG (last 3)  No results for input(s): "GLUCAP" in the last 72 hours.    Coagulation Profile: No results for input(s): "INR", "PROTIME" in the last 168 hours.   Radiology Studies: I have personally reviewed the imaging studies  CARDIAC CATHETERIZATION  Result Date: 04/07/2022   Colon Flattery Cx to Prox Cx lesion is 70% stenosed.   Mid LAD lesion is 50% stenosed.   RPAV lesion is 70% stenosed.   1st RPL lesion is 100% stenosed.   Previously placed Mid RCA to Dist RCA stent of unknown type is  widely patent. UMUT KIZZIRE is a 62 y.o. male  [id number] LOCATION:  FACILITY: [facility] PHYSICIAN: Quay Burow, M.D. May 22, 1960 DATE OF PROCEDURE:  04/07/2022 DATE OF DISCHARGE: CARDIAC CATHETERIZATION History obtained from  chart review.62 y.o. male with PMH of CAD status post stents, chronic angina, GERD, hyperlipidemia, OSA on CPAP, seizure disorder, who is currently admitted for chest pain, cardiology is following with concerning of unstable angina.  Patient's troponins were low and flat.  He had no acute  EKG changes.  He was referred for coronary angiography to define his anatomy.  His last cath was in 2018 by Dr. Angelena Form.  Dr. Martinique had performed [place] angioplasty of the distal PLA branch for "in-stent restenosis in the area that had "2 layers of stents". PROCEDURE DESCRIPTION: The patient was brought to the second floor [facility] cath lab in the postabsorptive state. He was premedicated with IV Versed and fentanyl. His right groin was prepped and shaved in usual sterile fashion. Xylocaine 1% was used for local anesthesia. A 5 French sheath was inserted into the right common femoral artery using standard Seldinger technique.  5 French left Judkins diagnostic catheters were used for selective coronary angiography and obtain left heart pressures.  Isovue dye is used for the entirety of the case (75 cc contrast total patient).  Retrograde aortic, ventricular and provide pressures were recorded.  LVEDP was measured at 3 mmHg.   Mr. Tosch anatomy is for all intents and purposes minimally changed from his last cath in 2018.  The area of "in-stent restenosis within the PLA branch has mildly progressed from 50 to 60 to 70% although I do not think this is accounting for his prolonged, constant atypical chest pain especially in light of his negative enzymes.  I would recommend continued medical therapy.  A right common femoral angiogram was performed and a Mynx closure device successfully deployed achieving hemostasis.  The patient left lab in stable condition. Quay Burow. MD, [facility] 04/07/2022 9:56 AM        Estill Cotta M.D. [facility] 04/07/2022, 2:40 PM  Available via Epic secure chat  7am-7pm After 7 pm, please refer to night coverage provider listed on amion.

## 2022-04-07 NOTE — Interval H&P Note (Signed)
Cath Lab Visit (complete for each Cath Lab visit)  Clinical Evaluation Leading to the Procedure:   ACS: Yes.    Non-ACS:    Anginal Classification: CCS II  Anti-ischemic medical therapy: Minimal Therapy (1 class of medications)  Non-Invasive Test Results: No non-invasive testing performed  Prior CABG: No previous CABG      History and Physical Interval Note:  04/07/2022 9:06 AM  Connor Baker  has presented today for surgery, with the diagnosis of unstable angina.  The various methods of treatment have been discussed with the patient and family. After consideration of risks, benefits and other options for treatment, the patient has consented to  Procedure(s): LEFT HEART CATH AND CORONARY ANGIOGRAPHY (N/A) as a surgical intervention.  The patient's history has been reviewed, patient examined, no change in status, stable for surgery.  I have reviewed the patient's chart and labs.  Questions were answered to the patient's satisfaction.     Quay Burow

## 2022-04-08 LAB — BASIC METABOLIC PANEL
Anion gap: 6 (ref 5–15)
BUN: 17 mg/dL (ref 8–23)
CO2: 25 mmol/L (ref 22–32)
Calcium: 8.9 mg/dL (ref 8.9–10.3)
Chloride: 105 mmol/L (ref 98–111)
Creatinine, Ser: 1.12 mg/dL (ref 0.61–1.24)
GFR, Estimated: >60 mL/min (ref >60)
Glucose, Bld: 103 mg/dL — ABNORMAL HIGH (ref 70–99)
Potassium: 4.1 mmol/L (ref 3.5–5.1)
Sodium: 136 mmol/L (ref 135–145)

## 2022-04-08 LAB — CBC
HCT: 39.3 % (ref 39.0–52.0)
Hemoglobin: 13.8 g/dL (ref 13.0–17.0)
MCH: 34 pg (ref 26.0–34.0)
MCHC: 35.1 g/dL (ref 30.0–36.0)
MCV: 96.8 fL (ref 80.0–100.0)
Platelets: 181 10*3/uL (ref 150–400)
RBC: 4.06 MIL/uL — ABNORMAL LOW (ref 4.22–5.81)
RDW: 12.7 % (ref 11.5–15.5)
WBC: 9.3 10*3/uL (ref 4.0–10.5)
nRBC: 0 % (ref 0.0–0.2)

## 2022-04-08 LAB — T4, FREE: Free T4: 0.7 ng/dL (ref 0.61–1.12)

## 2022-04-08 MED ORDER — RANOLAZINE ER 500 MG PO TB12
500.0000 mg | ORAL_TABLET | Freq: Two times a day (BID) | ORAL | Status: DC
  Administered 2022-04-08: 500 mg via ORAL
  Filled 2022-04-08: qty 1

## 2022-04-08 MED ORDER — OMEGA-3-ACID ETHYL ESTERS 1 G PO CAPS: 1.0000 g | ORAL_CAPSULE | Freq: Two times a day (BID) | ORAL | 1 refills | Status: DC

## 2022-04-08 MED ORDER — PANTOPRAZOLE SODIUM 40 MG PO TBEC: 40.0000 mg | DELAYED_RELEASE_TABLET | Freq: Every day | ORAL | 1 refills | Status: DC

## 2022-04-08 MED ORDER — RANOLAZINE ER 500 MG PO TB12: 1000.0000 mg | ORAL_TABLET | Freq: Two times a day (BID) | ORAL | Status: DC

## 2022-04-08 NOTE — Discharge Summary (Signed)
Physician Discharge Summary   Patient: Connor Baker MRN: [id number] DOB: 08/17/1960  Admit date:     04/04/2022  Discharge date: 04/08/22  Discharge Physician: Estill Cotta, MD    PCP: [facility]   Recommendations at discharge:   Started Lovaza 1 g twice daily, Started Protonix 40 mg daily Continue all other cardiac medications including Ranexa, aspirin, Plavix, Lipitor  Discharge Diagnoses:    Chest pain-atypical History of coronary disease with multiple distal RCA stent   HLD (hyperlipidemia)   GERD (gastroesophageal reflux disease)   OSA (obstructive sleep apnea)   History of seizures   Allergic rhinitis    Hospital Course:  Patient is a 62 year old male with CAD status post stents, chronic angina on Ranexa, GERD, HLP, OSA on nocturnal CPAP, seizure disorder presented with chest pain.  Patient reports an extensive cardiac history including CAD status post PCI with several stents, undergone coronary angiography in 09/2016.  He is on dual antiplatelet therapy, Ranexa for chronic angina. [facility] and reported that he recently transferred facilities 4 days ago and since the time of his transfer he has not been able to receive his cardiac medications.  Presented with 2 days of substernal nonradiating chest pressure, nonexertional, nonpositional and nonpleuritic, now constant over last 48 hours with mild shortness of breath, dizziness. Previous echo in 04/2020 showed EF of 60 to 65%, no focal WMA, normal right ventricular systolic function. Troponins negative in ED, chest x-ray showed no evidence of acute cardiopulmonary process.  CTA showed no acute PE or infiltrate/edema or pneumothorax. EKG showed sinus rhythm rate 71, no evidence of acute ST-T wave changes suggestive of ischemia. Cardiology was consulted, patient was admitted for further workup.   Assessment and Plan:  Chest pain with underlying history of extensive CAD, PCI, chronic angina -EKG with  no acute ST-T wave changes suggestive of ischemia, troponins x 2 negative -CTA showed no acute PE or infiltrates, pulmonary edema or pneumothorax -2D echo showed EF of 60 to 65%, normal left ventricular function, no WMA, G1 DD -Continue aspirin, Plavix, Lipitor, Ranexa.   -Cardiac cath done on 04/07/2022 femoral approach, per Dr. 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  Recommended continued medical therapy." -Added Lovaza, PPI       HLD (hyperlipidemia) -Continue statin, lipid panel showed cholesterol 116, HDL 39, LDL 35, triglycerides 208 -Started Lovaza, continue Lipitor     GERD (gastroesophageal reflux disease) -Continue Protonix     OSA (obstructive sleep apnea) -Continue CPAP qhs     History of seizures -Continue Keppra        Pain control - [place] Controlled Substance Reporting System database was reviewed. and patient was instructed, not to drive, operate heavy machinery, perform activities at heights, swimming or participation in water activities or provide baby-sitting services while on Pain, Sleep and Anxiety Medications; until their outpatient Physician has advised to do so again. Also recommended to not to take more than prescribed Pain, Sleep and Anxiety Medications.  Consultants: Cardiology Procedures performed: Cardiac cath, 2D echo Disposition:  Diet recommendation:  Discharge Diet Orders (From admission, onward)     Start     Ordered   04/08/22 0000  Diet - low sodium heart healthy        04/08/22 1017  DISCHARGE MEDICATION: Allergies as of 04/08/2022   No Known Allergies      Medication List     TAKE these medications    acetaminophen 500 MG tablet Commonly known as: TYLENOL Take 500-1,000 mg by mouth every 6 (six) hours as needed for mild pain (or  headaches).   ascorbic acid 500 MG tablet Commonly known as: VITAMIN C Take 500 mg by mouth daily.   aspirin EC 81 MG tablet Take 1 tablet (81 mg total) by mouth daily. Swallow whole.   atorvastatin 80 MG tablet Commonly known as: LIPITOR Take 1 tablet (80 mg total) by mouth daily.   cholecalciferol 25 MCG (1000 UNIT) tablet Commonly known as: VITAMIN D3 Take 1,000 Units by mouth daily.   clopidogrel 75 MG tablet Commonly known as: PLAVIX Take 1 tablet (75 mg total) by mouth daily.   levETIRAcetam 500 MG tablet Commonly known as: Keppra Take 1 tablet (500 mg total) by mouth 2 (two) times daily.   loratadine 10 MG tablet Commonly known as: CLARITIN Take 10 mg by mouth at bedtime.   naproxen sodium 220 MG tablet Commonly known as: ALEVE Take 220-440 mg by mouth 2 (two) times daily as needed (for mild pain or headaches).   nitroGLYCERIN 0.4 MG SL tablet Commonly known as: NITROSTAT Place 1 tablet (0.4 mg total) under the tongue every 5 (five) minutes x 3 doses as needed for chest pain.   omega-3 acid ethyl esters 1 g capsule Commonly known as: LOVAZA Take 1 capsule (1 g total) by mouth 2 (two) times daily.   pantoprazole 40 MG tablet Commonly known as: PROTONIX Take 1 tablet (40 mg total) by mouth daily. Start taking on: April 09, 2022   ranolazine 500 MG 12 hr tablet Commonly known as: Ranexa Take 1 tablet (500 mg total) by mouth 2 (two) times daily.        Follow-up [facility] Contact information: [place] [id number] [phone]         Marylu Lund., NP Follow up on 04/22/2022.   Specialty: Cardiology Why: at 8:25am for your cardiology appointment Contact information: [address] [place] [address] [phone]                Discharge Exam: Danley Danker Weights   04/06/22 0423 04/07/22 0412 04/08/22 0449  Weight: 89.9 kg 89.1 kg  100 kg   S: No acute issues, no nausea vomiting, shortness of breath, fevers.  Cleared by cardiology to discharge.  BP 123/78 (BP Location: Right Arm)   Pulse 64   Temp 97.9 F (36.6 C) (Oral)   Resp 16   Ht 5' 10"$  (1.778 m)   Wt 100 kg   SpO2 100%   BMI 31.63 kg/m   Physical Exam General: Alert and oriented x 3, NAD Cardiovascular: S1 S2 clear, RRR.  Respiratory: CTAB, no wheezing, rales or rhonchi Gastrointestinal: Soft, nontender, nondistended, NBS Ext: no pedal edema bilaterally Neuro: no new deficits Skin: No rashes Psych: Normal affect    Condition at discharge: fair  The results of significant diagnostics from this hospitalization (including imaging, microbiology, ancillary and laboratory) are listed below for reference.   Imaging Studies: CARDIAC CATHETERIZATION  Result Date: 04/07/2022   Colon Flattery Cx to Prox Cx lesion is 70% stenosed.   Mid LAD lesion is 50% stenosed.   RPAV lesion is 70% stenosed.   1st RPL lesion  is 100% stenosed.   Previously placed Mid RCA to Dist RCA stent of unknown type is  widely patent. SHAVEZ MCGARTY is a 62 y.o. male  [id number] LOCATION:  FACILITY: [facility] PHYSICIAN: Quay Burow, M.D. Jan 04, 1961 DATE OF PROCEDURE:  04/07/2022 DATE OF DISCHARGE: CARDIAC CATHETERIZATION History obtained from chart review.62 y.o. male with PMH of CAD status post stents, chronic angina, GERD, hyperlipidemia, OSA on CPAP, seizure disorder, who is currently admitted for chest pain, cardiology is following with concerning of unstable angina.  Patient's troponins were low and flat.  He had no acute EKG changes.  He was referred for coronary angiography to define his anatomy.  His last cath was in 2018 by Dr. Angelena Form.  Dr. Martinique had performed [place] angioplasty of the distal PLA branch for "in-stent restenosis in the area that had "2 layers of stents". PROCEDURE DESCRIPTION: The patient was brought to the second floor [facility] cath lab in the postabsorptive  state. He was premedicated with IV Versed and fentanyl. His right groin was prepped and shaved in usual sterile fashion. Xylocaine 1% was used for local anesthesia. A 5 French sheath was inserted into the right common femoral artery using standard Seldinger technique.  5 French left Judkins diagnostic catheters were used for selective coronary angiography and obtain left heart pressures.  Isovue dye is used for the entirety of the case (75 cc contrast total patient).  Retrograde aortic, ventricular and provide pressures were recorded.  LVEDP was measured at 3 mmHg.   Mr. Popa anatomy is for all intents and purposes minimally changed from his last cath in 2018.  The area of "in-stent restenosis within the PLA branch has mildly progressed from 50 to 60 to 70% although I do not think this is accounting for his prolonged, constant atypical chest pain especially in light of his negative enzymes.  I would recommend continued medical therapy.  A right common femoral angiogram was performed and a Mynx closure device successfully deployed achieving hemostasis.  The patient left lab in stable condition. Quay Burow. MD, [facility] 04/07/2022 9:56 AM    ECHOCARDIOGRAM COMPLETE  Result Date: 04/05/2022    ECHOCARDIOGRAM REPORT   Patient Name:   AYDON DEVANEY Date of Exam: 04/05/2022 Medical Rec #:  [id number]     Height:       70.0 in Accession #:    [id number]    Weight:       196.9 lb Date of Birth:  07-01-1960     BSA:          2.074 m Patient Age:    64 years      BP:           105/70 mmHg Patient Gender: M             HR:           54 bpm. Exam Location:  Inpatient Procedure: 2D Echo, Cardiac Doppler, Color Doppler and Intracardiac            Opacification Agent Indications:    R07.9* Chest pain, unspecified  History:        Patient has prior history of Echocardiogram examinations, most                 recent 05/15/2020. CHF, CAD and Previous Myocardial Infarction;                 Risk Factors:Sleep Apnea, Current  Smoker and Dyslipidemia.  Sonographer:    Barnett Applebaum  [place] RVT RCS Referring Phys: [id number] Rhetta Mura  Sonographer Comments: Suboptimal parasternal window and suboptimal subcostal window. IMPRESSIONS  1. Left ventricular ejection fraction, by estimation, is 60 to 65%. The left ventricle has normal function. The left ventricle has no regional wall motion abnormalities. There is mild left ventricular hypertrophy. Left ventricular diastolic parameters are consistent with Grade I diastolic dysfunction (impaired relaxation).  2. Right ventricular systolic function is normal. The right ventricular size is normal. Tricuspid regurgitation signal is inadequate for assessing PA pressure.  3. The mitral valve is normal in structure. Trivial mitral valve regurgitation. No evidence of mitral stenosis.  4. The aortic valve is tricuspid. There is mild calcification of the aortic valve. Aortic valve regurgitation is not visualized. No aortic stenosis is present.  5. The inferior vena cava is normal in size with greater than 50% respiratory variability, suggesting right atrial pressure of 3 mmHg. FINDINGS  Left Ventricle: Left ventricular ejection fraction, by estimation, is 60 to 65%. The left ventricle has normal function. The left ventricle has no regional wall motion abnormalities. Definity contrast agent was given IV to delineate the left ventricular  endocardial borders. The left ventricular internal cavity size was normal in size. There is mild left ventricular hypertrophy. Left ventricular diastolic parameters are consistent with Grade I diastolic dysfunction (impaired relaxation). Right Ventricle: The right ventricular size is normal. No increase in right ventricular wall thickness. Right ventricular systolic function is normal. Tricuspid regurgitation signal is inadequate for assessing PA pressure. The tricuspid regurgitant velocity is 1.51 m/s, and with an assumed right atrial pressure of 3 mmHg, the estimated right  ventricular systolic pressure is [id number] mmHg. Left Atrium: Left atrial size was normal in size. Right Atrium: Right atrial size was normal in size. Pericardium: There is no evidence of pericardial effusion. Mitral Valve: The mitral valve is normal in structure. Trivial mitral valve regurgitation. No evidence of mitral valve stenosis. Tricuspid Valve: The tricuspid valve is normal in structure. Tricuspid valve regurgitation is trivial. No evidence of tricuspid stenosis. Aortic Valve: The aortic valve is tricuspid. There is mild calcification of the aortic valve. Aortic valve regurgitation is not visualized. No aortic stenosis is present. Aortic valve mean gradient measures 3.0 mmHg. Aortic valve peak gradient measures 4.8 mmHg. Aortic valve area, by VTI measures 2.94 cm. Pulmonic Valve: The pulmonic valve was normal in structure. Pulmonic valve regurgitation is not visualized. No evidence of pulmonic stenosis. Aorta: The ascending aorta was not well visualized and the aortic root is normal in size and structure. Venous: The inferior vena cava is normal in size with greater than 50% respiratory variability, suggesting right atrial pressure of 3 mmHg. IAS/Shunts: No atrial level shunt detected by color flow Doppler.  LEFT VENTRICLE PLAX 2D LVIDd:         4.80 cm   Diastology LVIDs:         3.80 cm   LV e' medial:    5.17 cm/s LV PW:         0.90 cm   LV E/e' medial:  10.4 LV IVS:        1.20 cm   LV e' lateral:   7.45 cm/s LVOT diam:     2.10 cm   LV E/e' lateral: 7.2 LV SV:         72 LV SV Index:   35 LVOT Area:     3.46 cm  RIGHT VENTRICLE RV Basal diam:  3.00 cm RV S prime:  13.80 cm/s TAPSE (M-mode): 2.0 cm LEFT ATRIUM             Index        RIGHT ATRIUM          Index LA diam:        4.30 cm 2.07 cm/m   RA Area:     9.60 cm LA Vol (A2C):   41.6 ml 20.06 ml/m  RA Volume:   18.60 ml 8.97 ml/m LA Vol (A4C):   44.3 ml 21.36 ml/m LA Biplane Vol: 45.4 ml 21.90 ml/m  AORTIC VALVE                    PULMONIC  VALVE AV Area (Vmax):    2.68 cm     PV Vmax:       0.92 m/s AV Area (Vmean):   3.04 cm     PV Peak grad:  3.4 mmHg AV Area (VTI):     2.94 cm AV Vmax:           110.00 cm/s AV Vmean:          75.400 cm/s AV VTI:            0.244 m AV Peak Grad:      4.8 mmHg AV Mean Grad:      3.0 mmHg LVOT Vmax:         85.00 cm/s LVOT Vmean:        66.100 cm/s LVOT VTI:          0.207 m LVOT/AV VTI ratio: 0.85  AORTA Ao Root diam: 3.30 cm MITRAL VALVE               TRICUSPID VALVE MV Area (PHT): 2.95 cm    TR Peak grad:   9.1 mmHg MV Decel Time: 257 msec    TR Vmax:        151.00 cm/s MV E velocity: 54.00 cm/s MV A velocity: 68.10 cm/s  SHUNTS MV E/A ratio:  0.79        Systemic VTI:  0.21 m                            Systemic Diam: 2.10 cm Cherlynn Kaiser MD Electronically signed by Cherlynn Kaiser MD Signature Date/Time: 04/05/2022/2:23:45 PM    Final    CT Angio Chest PE W and/or Wo Contrast  Result Date: 04/05/2022 CLINICAL DATA:  History of DVT with right chest pain EXAM: CT ANGIOGRAPHY CHEST WITH CONTRAST TECHNIQUE: Multidetector CT imaging of the chest was performed using the standard protocol during bolus administration of intravenous contrast. Multiplanar CT image reconstructions and MIPs were obtained to evaluate the vascular anatomy. RADIATION DOSE REDUCTION: This exam was performed according to the departmental dose-optimization program which includes automated exposure control, adjustment of the mA and/or kV according to patient size and/or use of iterative reconstruction technique. CONTRAST:  46m OMNIPAQUE IOHEXOL 350 MG/ML SOLN COMPARISON:  07/21/2020 FINDINGS: Cardiovascular: No filling defects in the pulmonary arteries to suggest pulmonary emboli. Heart is normal size. Aorta is normal caliber. Diffuse coronary artery calcifications and scattered aortic calcifications. Mediastinum/Nodes: No mediastinal, hilar, or axillary adenopathy. Trachea and esophagus are unremarkable. Thyroid unremarkable.  Lungs/Pleura: Dependent atelectasis in the lungs.  No effusions. Upper Abdomen: No acute findings Musculoskeletal: Chest wall soft tissues are unremarkable. No acute bony abnormality. Review of the MIP images confirms the above findings. IMPRESSION: No evidence of pulmonary embolus.  Diffuse coronary artery disease. No acute cardiopulmonary disease. Aortic Atherosclerosis (ICD10-I70.0). Electronically Signed   By: Rolm Baptise M.D.   On: 04/05/2022 00:37   DG Chest Portable 1 View  Result Date: 04/04/2022 CLINICAL DATA:  Chest pain. EXAM: PORTABLE CHEST 1 VIEW COMPARISON:  Chest radiograph dated 12/28/2021. FINDINGS: No focal consolidation, pleural effusion, or pneumothorax the the cardiac silhouette is within limits. Atherosclerotic calcification of the aortic arch. No acute osseous pathology. IMPRESSION: No active disease. Electronically Signed   By: Anner Crete M.D.   On: 04/04/2022 23:16    Microbiology: Results for orders placed or performed during the hospital encounter of 07/21/20  Resp Panel by RT-PCR (Flu A&B, Covid) Nasopharyngeal Swab     Status: None   Collection Time: 07/21/20  1:31 PM   Specimen: Nasopharyngeal Swab; Nasopharyngeal(NP) swabs in vial transport medium  Result Value Ref Range Status   SARS Coronavirus 2 by RT PCR NEGATIVE NEGATIVE Final    Comment: (NOTE) SARS-CoV-2 target nucleic acids are NOT DETECTED.  The SARS-CoV-2 RNA is generally detectable in upper respiratory specimens during the acute phase of infection. The lowest concentration of SARS-CoV-2 viral copies this assay can detect is 138 copies/mL. A negative result does not preclude SARS-Cov-2 infection and should not be used as the sole basis for treatment or other patient management decisions. A negative result may occur with  improper specimen collection/handling, submission of specimen other than nasopharyngeal swab, presence of viral mutation(s) within the areas targeted by this assay, and inadequate  number of viral copies(<138 copies/mL). A negative result must be combined with clinical observations, patient history, and epidemiological information. The expected result is Negative.  Fact Sheet for Patients:  EntrepreneurPulse.com.au  Fact Sheet for Healthcare Providers:  IncredibleEmployment.be  This test is no t yet approved or cleared by the [place] FDA and  has been authorized for detection and/or diagnosis of SARS-CoV-2 by FDA under an Emergency Use Authorization (EUA). This EUA will remain  in effect (meaning this test can be used) for the duration of the COVID-19 declaration under Section 564(b)(1) of the Act, 21 U.S.C.section 360bbb-3(b)(1), unless the authorization is terminated  or revoked sooner.       Influenza A by PCR NEGATIVE NEGATIVE Final   Influenza B by PCR NEGATIVE NEGATIVE Final    Comment: (NOTE) The Xpert Xpress SARS-CoV-2/FLU/RSV plus assay is intended as an aid in the diagnosis of influenza from Nasopharyngeal swab specimens and should not be used as a sole basis for treatment. Nasal washings and aspirates are unacceptable for Xpert Xpress SARS-CoV-2/FLU/RSV testing.  Fact Sheet for Patients: EntrepreneurPulse.com.au  Fact Sheet for Healthcare Providers: IncredibleEmployment.be  This test is not yet approved or cleared by the [place] FDA and has been authorized for detection and/or diagnosis of SARS-CoV-2 by FDA under an Emergency Use Authorization (EUA). This EUA will remain in effect (meaning this test can be used) for the duration of the COVID-19 declaration under Section 564(b)(1) of the Act, 21 U.S.C. section 360bbb-3(b)(1), unless the authorization is terminated or revoked.  Performed at [facility], [place] [address] [place], [place] [address]   MRSA PCR Screening     Status: None   Collection Time: 07/21/20 10:49 PM   Specimen: Nasal Mucosa;  Nasopharyngeal  Result Value Ref Range Status   MRSA by PCR NEGATIVE NEGATIVE Final    Comment:        The GeneXpert MRSA Assay (FDA approved for NASAL specimens only), is one component of  a comprehensive MRSA colonization surveillance program. It is not intended to diagnose MRSA infection nor to guide or monitor treatment for MRSA infections. Performed at [facility], [place] [address] [place], [place] [address]     Labs: CBC: Recent Labs  Lab 04/04/22 2252 04/05/22 0549 04/06/22 0221 04/07/22 0236 04/08/22 0156  WBC 7.5 8.1 7.5 8.3 9.3  NEUTROABS 3.2 3.4  --   --   --   HGB 13.6 14.0 13.4 13.2 13.8  HCT 38.3* 39.1 39.4 38.1* 39.3  MCV 97.0 97.3 98.5 97.9 96.8  PLT 191 181 174 164 0000000   Basic Metabolic Panel: Recent Labs  Lab 04/04/22 2252 04/05/22 0549 04/06/22 0221 04/07/22 0236 04/08/22 0156  NA 138 139 138 136 136  K 3.9 3.5 3.8 3.9 4.1  CL 107 105 104 104 105  CO2 23 26 25 24 25  $ GLUCOSE 97 89 115* 91 103*  BUN 10 8 11 17 17  $ CREATININE 0.87 0.87 0.97 1.22 1.12  CALCIUM 8.5* 8.7* 8.8* 8.7* 8.9  MG  --  2.1  --   --   --    Liver Function Tests: Recent Labs  Lab 04/04/22 2252 04/05/22 0549  AST 29 32  ALT 48* 51*  ALKPHOS 56 60  BILITOT 0.6 1.1  PROT 6.1* 6.6  ALBUMIN 3.5 3.7   CBG: No results for input(s): "GLUCAP" in the last 168 hours.  Discharge time spent: greater than 30 minutes.  Signed: [place] Cotta, MD [facility] 04/08/2022

## 2022-04-08 NOTE — Progress Notes (Signed)
Rounding Note    Patient Name: Connor Baker Date of Encounter: 04/08/2022  [place] Cardiologist: Lauree Chandler, MD   Subjective   Still with CP  Inpatient Medications    Scheduled Meds:  aspirin  81 mg Oral Daily   atorvastatin  80 mg Oral Daily   clopidogrel  75 mg Oral Daily   levETIRAcetam  500 mg Oral BID   omega-3 acid ethyl esters  1 g Oral BID   pantoprazole  40 mg Oral Daily   ranolazine  500 mg Oral BID   sodium chloride flush  3 mL Intravenous Q12H   sodium chloride flush  3 mL Intravenous Q12H   Continuous Infusions:  sodium chloride     PRN Meds: sodium chloride, [DISCONTINUED] acetaminophen **OR** acetaminophen, acetaminophen, melatonin, morphine injection, naLOXone (NARCAN)  injection, ondansetron (ZOFRAN) IV, sodium chloride flush   Vital Signs    Vitals:   04/07/22 1217 04/07/22 1315 04/07/22 2046 04/08/22 0449  BP:  110/79 112/75 94/71  Pulse: 60 63 76 (!) 55  Resp:  16 18 18  $ Temp:  97.8 F (36.6 C) 98.2 F (36.8 C) 97.9 F (36.6 C)  TempSrc:  Oral Oral Oral  SpO2:  95% 95% 96%  Weight:    100 kg  Height:        Intake/Output Summary (Last 24 hours) at 04/08/2022 0739 Last data filed at 04/07/2022 1400 Gross per 24 hour  Intake --  Output 600 ml  Net -600 ml      04/08/2022    4:49 AM 04/07/2022    4:12 AM 04/06/2022    4:23 AM  Last 3 Weights  Weight (lbs) 220 lb 7.4 oz 196 lb 6.9 oz 198 lb 3.1 oz  Weight (kg) 100 kg 89.1 kg 89.9 kg      Telemetry    Sinus rhythm  - Personally Reviewed   Physical Exam   GEN: No acute distress.   Neck: No JVD Cardiac: RRR, no murmurs, rubs, or gallops.  Respiratory: Clear to auscultation bilaterally. GI: Soft, non-distended  MS: No leg edema Neuro:  Nonfocal  Psych: Normal affect   Labs    High Sensitivity Troponin:   Recent Labs  Lab 04/04/22 2252 04/05/22 0230 04/05/22 0927  TROPONINIHS 4 4 5      $ Chemistry Recent Labs  Lab 04/04/22 2252  04/05/22 0549 04/06/22 0221 04/07/22 0236 04/08/22 0156  NA 138 139 138 136 136  K 3.9 3.5 3.8 3.9 4.1  CL 107 105 104 104 105  CO2 23 26 25 24 25  $ GLUCOSE 97 89 115* 91 103*  BUN 10 8 11 17 17  $ CREATININE 0.87 0.87 0.97 1.22 1.12  CALCIUM 8.5* 8.7* 8.8* 8.7* 8.9  MG  --  2.1  --   --   --   PROT 6.1* 6.6  --   --   --   ALBUMIN 3.5 3.7  --   --   --   AST 29 32  --   --   --   ALT 48* 51*  --   --   --   ALKPHOS 56 60  --   --   --   BILITOT 0.6 1.1  --   --   --   GFRNONAA >60 >60 >60 >60 >60  ANIONGAP 8 8 9 8 6     $ Lipids  Recent Labs  Lab 04/06/22 0221  CHOL 116  TRIG 208*  HDL 39*  LDLCALC 35  CHOLHDL 3.0     Hematology Recent Labs  Lab 04/06/22 0221 04/07/22 0236 04/08/22 0156  WBC 7.5 8.3 9.3  RBC 4.00* 3.89* 4.06*  HGB 13.4 13.2 13.8  HCT 39.4 38.1* 39.3  MCV 98.5 97.9 96.8  MCH 33.5 33.9 34.0  MCHC 34.0 34.6 35.1  RDW 12.9 12.7 12.7  PLT 174 164 181    Thyroid  Recent Labs  Lab 04/07/22 0236  TSH 7.480*     Radiology    CARDIAC CATHETERIZATION  Result Date: 04/07/2022   Colon Flattery Cx to Prox Cx lesion is 70% stenosed.   Mid LAD lesion is 50% stenosed.   RPAV lesion is 70% stenosed.   1st RPL lesion is 100% stenosed.   Previously placed Mid RCA to Dist RCA stent of unknown type is  widely patent. SUTTER LOMBARDOZZI is a 62 y.o. male  [id number] LOCATION:  FACILITY: [facility] PHYSICIAN: Quay Burow, M.D. 04/18/1960 DATE OF PROCEDURE:  04/07/2022 DATE OF DISCHARGE: CARDIAC CATHETERIZATION History obtained from chart review.63 y.o. male with PMH of CAD status post stents, chronic angina, GERD, hyperlipidemia, OSA on CPAP, seizure disorder, who is currently admitted for chest pain, cardiology is following with concerning of unstable angina.  Patient's troponins were low and flat.  He had no acute EKG changes.  He was referred for coronary angiography to define his anatomy.  His last cath was in 2018 by Dr. Angelena Form.  Dr. Martinique had performed Cutting Balloon  angioplasty of the distal PLA branch for "in-stent restenosis in the area that had "2 layers of stents". PROCEDURE DESCRIPTION: The patient was brought to the second floor [facility] cath lab in the postabsorptive state. He was premedicated with IV Versed and fentanyl. His right groin was prepped and shaved in usual sterile fashion. Xylocaine 1% was used for local anesthesia. A 5 French sheath was inserted into the right common femoral artery using standard Seldinger technique.  5 French left Judkins diagnostic catheters were used for selective coronary angiography and obtain left heart pressures.  Isovue dye is used for the entirety of the case (75 cc contrast total patient).  Retrograde aortic, ventricular and provide pressures were recorded.  LVEDP was measured at 3 mmHg.   Mr. Hockaday anatomy is for all intents and purposes minimally changed from his last cath in 2018.  The area of "in-stent restenosis within the PLA branch has mildly progressed from 50 to 60 to 70% although I do not think this is accounting for his prolonged, constant atypical chest pain especially in light of his negative enzymes.  I would recommend continued medical therapy.  A right common femoral angiogram was performed and a Mynx closure device successfully deployed achieving hemostasis.  The patient left lab in stable condition. Quay Burow. MD, [facility] 04/07/2022 9:56 AM     Cardiac Studies   Echocardiogram from 04/05/2022:    1. Left ventricular ejection fraction, by estimation, is 60 to 65%. The  left ventricle has normal function. The left ventricle has no regional  wall motion abnormalities. There is mild left ventricular hypertrophy.  Left ventricular diastolic parameters  are consistent with Grade I diastolic dysfunction (impaired relaxation).   2. Right ventricular systolic function is normal. The right ventricular  size is normal. Tricuspid regurgitation signal is inadequate for assessing  PA pressure.    3. The mitral valve is normal in structure. Trivial mitral valve  regurgitation. No evidence of mitral stenosis.   4. The aortic valve is tricuspid.  There is mild calcification of the  aortic valve. Aortic valve regurgitation is not visualized. No aortic  stenosis is present.   5. The inferior vena cava is normal in size with greater than 50%  respiratory variability, suggesting right atrial pressure of 3 mmHg.    Left heart catheterization 10/24/2016:  1st RPLB lesion, 100 %stenosed. Post Atrio-1 lesion, 50 %stenosed. Mid RCA to Dist RCA lesion, 5 %stenosed. Post Atrio-2 lesion, 50 %stenosed. Ost Cx lesion, 25 %stenosed. Mid RCA lesion, 30 %stenosed. Ost LAD to Prox LAD lesion, 40 %stenosed. The left ventricular systolic function is normal. LV end diastolic pressure is normal. The left ventricular ejection fraction is 55-65% by visual estimate.   1. Single vessel obstructive CAD with chronic occlusion of a very small PL branch with left to right collaterals. The prior PTCA site in the proximal PLOM is unchanged from prior with a 50% stenosis. No new disease to explain his ongoing chest pain 2. Normal LV function 3. Normal LVEDP   Plan: I do not see any culprit lesion for his recurrent chest pain. I question whether his pain is anginal. I would continue medical therapy.     Patient Profile     62 y.o. male with PMH of CAD status post stents, chronic angina, GERD, hyperlipidemia, OSA on CPAP, seizure disorder, who is currently admitted for chest pain, cardiology is following with concerning of unstable angina.  LHC is pending today.  Assessment & Plan    Chest pain  CAD with history of multiple distal RCA stents Cardiac catheterization results noted.  Plan is medical therapy.  Continue aspirin, Plavix, Lipitor and Ranexa.  Patient has had persistent chest pain and enzymes are negative.  Symptoms do not appear to be consistent with cardiac etiology.  Note his echocardiogram showed  preserved LV function.   Hyperlipidemia -LDL 35, triglyceride 208 -Resumed Lipitor 10m and [place] at admission  Patient can be discharged from a cardiac standpoint.  Will arrange follow-up with APP in 2 to 4 weeks.  Cardiology will sign off.  Please call with questions.  For questions or updates, please contact [place] consult [email or web address] for contact info under     Signed, XMargie Billet NP  04/08/2022, 7:39 AM

## 2022-04-08 NOTE — Care Management (Signed)
  Transition of Care (TOC) Screening Note   Patient Details  Name: Connor Baker Date of Birth: 1960-05-31   Transition of Care [facility]) CM/SW Contact:    Bethena Roys, RN Phone Number: 04/08/2022, 11:20 AM    Transition of Care Department [facility]) has reviewed the patient. Case Manager called the [facility] at [phone] and spoke with the RN. Per Staff, patient will be able to return to the facility today. Officer in the room will call for transportation. No further needs identified at this time.

## 2022-04-09 LAB — LIPOPROTEIN A (LPA): Lipoprotein (a): 161.2 nmol/L — ABNORMAL HIGH (ref <75.0)

## 2022-04-20 NOTE — Progress Notes (Deleted)
Office Visit    Patient Name: Connor Baker Date of Encounter: 04/20/2022  Primary Care Provider:  [place] Primary Cardiologist:  Lauree Chandler, MD Primary Electrophysiologist: None  Chief Complaint    Connor Baker is a 62 y.o. male with PMH of CAD s/p  multipel MI s/p multiple PCI's, PCI of RCA (CTO RPL w/ L-->R colalterals 12/2017), , OSA on CPAP, stroke 2005 and HTN presents today for posthospital follow-up.  Past Medical History    Past Medical History:  Diagnosis Date   Arthritis    "legs" (08/28/2016)   Chronic lower back pain    CVA (cerebral vascular accident) ([place])    Depression    GERD (gastroesophageal reflux disease)    High cholesterol    Hypertension    MI (myocardial infarction) ([place]) 1992; 1994; 1995   MI (myocardial infarction) ([place])    [phone]   On home oxygen therapy    "4L just at night w/my CPAP" (08/28/2016)   OSA (obstructive sleep apnea)    w/oxygen" (08/28/2016)   Stroke ([place]) 2005   "mild one; faded my memory" (08/28/2016)   Past Surgical History:  Procedure Laterality Date   ANKLE FRACTURE SURGERY Right 2016   [place] - 2017   "I've got a total of 16 stents in me" (08/28/2016)   CORONARY BALLOON ANGIOPLASTY Right 08/28/2016   Procedure: Coronary Balloon Angioplasty;  Surgeon: Belva Crome, MD;  Location: [facility];  Service: Cardiovascular;  Laterality: Right;   FRACTURE SURGERY     LEFT HEART CATH AND CORONARY ANGIOGRAPHY N/A 08/28/2016   Procedure: Left Heart Cath and Coronary Angiography;  Surgeon: Belva Crome, MD;  Location: [facility];  Service: Cardiovascular;  Laterality: N/A;   LEFT HEART CATH AND CORONARY ANGIOGRAPHY N/A 10/24/2016   Procedure: LEFT HEART CATH AND CORONARY ANGIOGRAPHY;  Surgeon: Martinique, Peter M, MD;  Location: [facility];  Service: Cardiovascular;  Laterality: N/A;   LEFT HEART CATH AND CORONARY ANGIOGRAPHY  N/A 04/07/2022   Procedure: LEFT HEART CATH AND CORONARY ANGIOGRAPHY;  Surgeon: Lorretta Harp, MD;  Location: [facility];  Service: Cardiovascular;  Laterality: N/A;   TOTAL HIP ARTHROPLASTY Right 1990    Allergies  No Known Allergies  History of Present Illness    Connor Baker  is a 62 year old male with the above mention past medical history who presents today for posthospital follow-up.  Mr. Sobalvarro presented t the ED on 04/04/2022 by EMS with complaint of chest pain.  He was given 324 mg ASA and nitroglycerin x 4 without relief.  He described the pain as pressure-like associated with mild shortness of breath.  He has an extensive history of MIs with multiple stents placed at outside facilities.  He was last LHC was performed in 2018 with single-vessel CAD and CTO of very small PL branch.  2D echo was completed and revealed EF of 60 to 65% with no RWMA and mild LVH with grade 1 DD.  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  There was no culprit lesion to explain patient's chest pain.  Medical therapy was recommended and patient was discharged in stable condition.  Since last being seen in the office patient reports***.  Patient denies chest pain, palpitations, dyspnea, PND, orthopnea, nausea, vomiting, dizziness, syncope,  edema, weight gain, or early satiety.   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 -Patient currently on Ranexa Home Medications    Current Outpatient Medications  Medication Sig Dispense Refill   acetaminophen (TYLENOL) 500 MG tablet Take 500-1,000 mg by mouth every 6 (six) hours as needed for mild pain (or headaches).     ascorbic acid (VITAMIN C) 500 MG tablet Take 500 mg by mouth daily.     aspirin EC 81 MG EC tablet Take 1 tablet (81 mg total) by mouth daily. Swallow whole. 90 tablet 6   atorvastatin  (LIPITOR) 80 MG tablet Take 1 tablet (80 mg total) by mouth daily. 90 tablet 6   cholecalciferol (VITAMIN D3) 25 MCG (1000 UNIT) tablet Take 1,000 Units by mouth daily.     clopidogrel (PLAVIX) 75 MG tablet Take 1 tablet (75 mg total) by mouth daily. 90 tablet 3   levETIRAcetam (KEPPRA) 500 MG tablet Take 1 tablet (500 mg total) by mouth 2 (two) times daily. 10 tablet 0   loratadine (CLARITIN) 10 MG tablet Take 10 mg by mouth at bedtime.     naproxen sodium (ALEVE) 220 MG tablet Take 220-440 mg by mouth 2 (two) times daily as needed (for mild pain or headaches).     nitroGLYCERIN (NITROSTAT) 0.4 MG SL tablet Place 1 tablet (0.4 mg total) under the tongue every 5 (five) minutes x 3 doses as needed for chest pain. 25 tablet 0   omega-3 acid ethyl esters (LOVAZA) 1 g capsule Take 1 capsule (1 g total) by mouth 2 (two) times daily. 60 capsule 1   pantoprazole (PROTONIX) 40 MG tablet Take 1 tablet (40 mg total) by mouth daily. 30 tablet 1   ranolazine (RANEXA) 500 MG 12 hr tablet Take 1 tablet (500 mg total) by mouth 2 (two) times daily. 90 tablet 0   No current facility-administered medications for this visit.     Review of Systems  Please see the history of present illness.    (+)*** (+)***  All other systems reviewed and are otherwise negative except as noted above.  Physical Exam    Wt Readings from Last 3 Encounters:  04/08/22 220 lb 7.4 oz (100 kg)  12/28/21 210 lb 1.6 oz (95.3 kg)  07/21/20 160 lb (72.6 kg)   [id number] were no vitals filed for this visit.,There is no height or weight on file to calculate BMI.  Constitutional:      Appearance: Healthy appearance. Not in distress.  Neck:     Vascular: JVD normal.  Pulmonary:     Effort: Pulmonary effort is normal.     Breath sounds: No wheezing. No rales. Diminished in the bases Cardiovascular:     Normal rate. Regular rhythm. Normal S1. Normal S2.      Murmurs: There is no murmur.  Edema:    Peripheral edema absent.   Abdominal:     Palpations: Abdomen is soft non tender. There is no hepatomegaly.  Skin:    General: Skin is warm and dry.  Neurological:     General: No focal deficit present.     Mental Status: Alert and oriented to person, place and time.     Cranial Nerves: Cranial nerves are intact.  EKG/LABS/Other Studies Reviewed    ECG personally reviewed by me today - ***  Risk Assessment/Calculations:   {Does this patient have ATRIAL FIBRILLATION?[phone]}        Lab Results  Component Value Date  WBC 9.3 04/08/2022   HGB 13.8 04/08/2022   HCT 39.3 04/08/2022   MCV 96.8 04/08/2022   PLT 181 04/08/2022   Lab Results  Component Value Date   CREATININE 1.12 04/08/2022   BUN 17 04/08/2022   NA 136 04/08/2022   K 4.1 04/08/2022   CL 105 04/08/2022   CO2 25 04/08/2022   Lab Results  Component Value Date   ALT 51 (H) 04/05/2022   AST 32 04/05/2022   ALKPHOS 60 04/05/2022   BILITOT 1.1 04/05/2022   Lab Results  Component Value Date   CHOL 116 04/06/2022   HDL 39 (L) 04/06/2022   LDLCALC 35 04/06/2022   TRIG 208 (H) 04/06/2022   CHOLHDL 3.0 04/06/2022    Lab Results  Component Value Date   HGBA1C 5.2 04/07/2022    Assessment & Plan    1.  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  Medical therapy recommended -Today patient reports*** -Continue GDMT with***  2.  Essential hypertension: -Patient's blood pressure today was*** -Continue***  3.  Hyperlipidemia: -Patient's last LDL cholesterol was*** -Continue***  4.  Obstructive sleep apnea: -On CPAP      Disposition: Follow-up with Lauree Chandler, MD or APP in *** months {Are you ordering a CV Procedure (e.g. stress test, cath, DCCV, TEE, etc)?   Press F2        :[id number]   Medication Adjustments/Labs and Tests Ordered: Current medicines are reviewed at  length with the patient today.  Concerns regarding medicines are outlined above.   Signed, Mable Fill, Marissa Nestle, NP 04/20/2022, 7:27 PM [facility]  Note:  This document was prepared using Dragon voice recognition software and may include unintentional dictation errors.

## 2022-04-22 ENCOUNTER — Ambulatory Visit: Payer: Medicaid Other | Admitting: Nurse Practitioner

## 2022-04-22 DIAGNOSIS — G4733 Obstructive sleep apnea (adult) (pediatric): Secondary | ICD-10-CM

## 2022-04-22 DIAGNOSIS — E782 Mixed hyperlipidemia: Secondary | ICD-10-CM

## 2022-04-22 DIAGNOSIS — I25110 Atherosclerotic heart disease of native coronary artery with unstable angina pectoris: Secondary | ICD-10-CM

## 2022-04-22 DIAGNOSIS — I10 Essential (primary) hypertension: Secondary | ICD-10-CM

## 2022-04-29 ENCOUNTER — Emergency Department (HOSPITAL_COMMUNITY)

## 2022-04-29 ENCOUNTER — Emergency Department (HOSPITAL_COMMUNITY)
Admission: EM | Admit: 2022-04-29 | Discharge: 2022-04-29 | Attending: Emergency Medicine | Admitting: Emergency Medicine

## 2022-04-29 ENCOUNTER — Encounter (HOSPITAL_COMMUNITY): Payer: Self-pay | Admitting: Emergency Medicine

## 2022-04-29 ENCOUNTER — Other Ambulatory Visit: Payer: Self-pay

## 2022-04-29 DIAGNOSIS — I10 Essential (primary) hypertension: Secondary | ICD-10-CM | POA: Diagnosis not present

## 2022-04-29 DIAGNOSIS — Z8679 Personal history of other diseases of the circulatory system: Secondary | ICD-10-CM

## 2022-04-29 DIAGNOSIS — Z7982 Long term (current) use of aspirin: Secondary | ICD-10-CM | POA: Diagnosis not present

## 2022-04-29 DIAGNOSIS — Z955 Presence of coronary angioplasty implant and graft: Secondary | ICD-10-CM | POA: Diagnosis not present

## 2022-04-29 DIAGNOSIS — Z7902 Long term (current) use of antithrombotics/antiplatelets: Secondary | ICD-10-CM | POA: Insufficient documentation

## 2022-04-29 DIAGNOSIS — R079 Chest pain, unspecified: Secondary | ICD-10-CM | POA: Diagnosis not present

## 2022-04-29 DIAGNOSIS — R0789 Other chest pain: Secondary | ICD-10-CM | POA: Insufficient documentation

## 2022-04-29 DIAGNOSIS — I2510 Atherosclerotic heart disease of native coronary artery without angina pectoris: Secondary | ICD-10-CM | POA: Insufficient documentation

## 2022-04-29 LAB — BASIC METABOLIC PANEL
Anion gap: 9 (ref 5–15)
BUN: 15 mg/dL (ref 8–23)
CO2: 25 mmol/L (ref 22–32)
Calcium: 9.3 mg/dL (ref 8.9–10.3)
Chloride: 104 mmol/L (ref 98–111)
Creatinine, Ser: 0.77 mg/dL (ref 0.61–1.24)
GFR, Estimated: >60 mL/min (ref >60)
Glucose, Bld: 128 mg/dL — ABNORMAL HIGH (ref 70–99)
Potassium: 3.7 mmol/L (ref 3.5–5.1)
Sodium: 138 mmol/L (ref 135–145)

## 2022-04-29 LAB — CBC
HCT: 40.9 % (ref 39.0–52.0)
Hemoglobin: 15.1 g/dL (ref 13.0–17.0)
MCH: 35.6 pg — ABNORMAL HIGH (ref 26.0–34.0)
MCHC: 36.9 g/dL — ABNORMAL HIGH (ref 30.0–36.0)
MCV: 96.5 fL (ref 80.0–100.0)
Platelets: 206 10*3/uL (ref 150–400)
RBC: 4.24 MIL/uL (ref 4.22–5.81)
RDW: 12.9 % (ref 11.5–15.5)
WBC: 10.6 10*3/uL — ABNORMAL HIGH (ref 4.0–10.5)
nRBC: 0 % (ref 0.0–0.2)

## 2022-04-29 LAB — TROPONIN I (HIGH SENSITIVITY)
Troponin I (High Sensitivity): 10 ng/L (ref <18)
Troponin I (High Sensitivity): 11 ng/L (ref <18)

## 2022-04-29 LAB — PROTIME-INR
INR: 1 (ref 0.8–1.2)
Prothrombin Time: 12.6 seconds (ref 11.4–15.2)

## 2022-04-29 MED ORDER — MORPHINE SULFATE (PF) 4 MG/ML IV SOLN
4.0000 mg | Freq: Once | INTRAVENOUS | Status: AC
  Administered 2022-04-29: 4 mg via INTRAVENOUS
  Filled 2022-04-29: qty 1

## 2022-04-29 MED ORDER — KETOROLAC TROMETHAMINE 30 MG/ML IJ SOLN
30.0000 mg | Freq: Once | INTRAMUSCULAR | Status: AC
  Administered 2022-04-29: 30 mg via INTRAVENOUS
  Filled 2022-04-29: qty 1

## 2022-04-29 NOTE — ED Notes (Signed)
ED Provider at bedside. 

## 2022-04-29 NOTE — ED Triage Notes (Signed)
[facility]/o chest pain. EMS states pt has significant cardiac history with recent PCI on Feb 9th, 2024. EMS gave pt '324mg'$  ASA and 2 sl NTG en route.

## 2022-04-29 NOTE — ED Notes (Signed)
Gave pt urinal 

## 2022-04-29 NOTE — ED Provider Notes (Signed)
[facility] Provider Note   CSN: [id number] Arrival date & time: 04/29/22  0119     History  Chief Complaint  Patient presents with   Chest Pain    Connor Baker is a 62 y.o. male.  This patient is a 62 year old male with past medical history of coronary artery disease with prior stent.  Patient presenting today with complaints of chest discomfort.  Approximately 1 hour prior to presentation, he reports feeling sweaty and his chest began to "pressurized".  This began when he stood to walk to the bathroom.  He feels short of breath, but denies any nausea.  There are no aggravating or alleviating factors.  Patient does have a history of coronary artery disease.  He had a heart cath performed 3 weeks ago which was basically unchanged from 2018 and medical management was recommended.  The history is provided by the patient.       Home Medications Prior to Admission medications   Medication Sig Start Date End Date Taking? Authorizing Provider  acetaminophen (TYLENOL) 500 MG tablet Take 500-1,000 mg by mouth every 6 (six) hours as needed for mild pain (or headaches).    [provider]  ascorbic acid (VITAMIN C) 500 MG tablet Take 500 mg by mouth daily.    [provider]  aspirin EC 81 MG EC tablet Take 1 tablet (81 mg total) by mouth daily. Swallow whole. 05/16/20   Tommie Raymond, NP  atorvastatin (LIPITOR) 80 MG tablet Take 1 tablet (80 mg total) by mouth daily. 05/16/20   Kathyrn Drown D, NP  cholecalciferol (VITAMIN D3) 25 MCG (1000 UNIT) tablet Take 1,000 Units by mouth daily.    [provider]  clopidogrel (PLAVIX) 75 MG tablet Take 1 tablet (75 mg total) by mouth daily. 05/15/20   Tommie Raymond, NP  levETIRAcetam (KEPPRA) 500 MG tablet Take 1 tablet (500 mg total) by mouth 2 (two) times daily. 07/23/20   Clovis Riley, MD  loratadine (CLARITIN) 10 MG tablet Take 10 mg by mouth at bedtime.    [provider]  naproxen sodium (ALEVE) 220 MG tablet Take 220-440 mg by mouth 2 (two) times daily as needed (for mild pain or headaches).    [provider]  nitroGLYCERIN (NITROSTAT) 0.4 MG SL tablet Place 1 tablet (0.4 mg total) under the tongue every 5 (five) minutes x 3 doses as needed for chest pain. 01/02/18   Tommie Raymond, NP  omega-3 acid ethyl esters (LOVAZA) 1 g capsule Take 1 capsule (1 g total) by mouth 2 (two) times daily. 04/08/22   Rai, Ripudeep K, MD  pantoprazole (PROTONIX) 40 MG tablet Take 1 tablet (40 mg total) by mouth daily. 04/09/22   Rai, Vernelle Emerald, MD  ranolazine (RANEXA) 500 MG 12 hr tablet Take 1 tablet (500 mg total) by mouth 2 (two) times daily. 11/26/20   Quintella Reichert, MD      Allergies    Patient has no known allergies.    Review of Systems   Review of Systems  All other systems reviewed and are negative.   Physical Exam Updated Vital Signs BP (!) 118/97 (BP Location: Left Arm)   Pulse 85   Temp 97.6 F (36.4 C) (Oral)   Resp 17   Ht '5\' 10"'$  (1.778 m)   Wt 100 kg   SpO2 100%   BMI 31.63 kg/m  Physical Exam Vitals and nursing note reviewed.  Constitutional:  General: He is not in acute distress.    Appearance: He is well-developed. He is not diaphoretic.  HENT:     Head: Normocephalic and atraumatic.  Cardiovascular:     Rate and Rhythm: Normal rate and regular rhythm.     Heart sounds: No murmur heard.    No friction rub.  Pulmonary:     Effort: Pulmonary effort is normal. No respiratory distress.     Breath sounds: Normal breath sounds. No wheezing or rales.  Abdominal:     General: Bowel sounds are normal. There is no distension.     Palpations: Abdomen is soft.     Tenderness: There is no abdominal tenderness.  Musculoskeletal:        General: Normal range of motion.     Cervical back: Normal range of motion and neck supple.     Right lower leg: No tenderness. No edema.     Left lower leg: No tenderness. No edema.   Skin:    General: Skin is warm and dry.  Neurological:     Mental Status: He is alert and oriented to person, place, and time.     Coordination: Coordination normal.     ED Results / Procedures / Treatments   Labs (all labs ordered are listed, but only abnormal results are displayed) Labs Reviewed  BASIC METABOLIC PANEL  CBC  PROTIME-INR  TROPONIN I (HIGH SENSITIVITY)    EKG EKG Interpretation  Date/Time:  Tuesday April 29 2022 01:26:27 EST Ventricular Rate:  82 PR Interval:  131 QRS Duration: 92 QT Interval:  349 QTC Calculation: 408 R Axis:   68 Text Interpretation: Sinus rhythm Borderline repolarization abnormality No significant change since 04/08/2022 Confirmed by Veryl Speak [phone]) on 04/29/2022 1:33:41 AM  Radiology No results found.  Procedures Procedures    Medications Ordered in ED Medications  ketorolac (TORADOL) 30 MG/ML injection 30 mg (has no administration in time range)    ED Course/ Medical Decision Making/ A&P  Patient is a 62 year old male with past medical history of coronary artery disease with stent.  Patient presenting today with complaints of chest pain as described in the HPI.  He arrives here with stable vital signs, no hypoxia, and no fever.  Physical examination basically unremarkable.  Workup initiated including CBC, metabolic panel, and troponin, all of which were negative.  Repeat troponin at the 2-hour mark was also negative.  Chest x-ray is clear.  Patient given Toradol for pain with some relief, then asked for additional medications and was given morphine.  Patient's workup unremarkable for cardiac etiology.  His symptoms seem somewhat atypical and troponin x 2 is negative.  Upon reviewing his records from his last hospitalization, his cath report is basically unchanged from that of 2018 and medical management was recommended.  Patient tells me that he was informed he would require bypass surgery, but I do not see this in the  hospital notes.  Patient has been here for several hours and remains stable.  Cardiac workup is all negative and I feel patient can be discharged with outpatient follow-up.  He is informed me that the [facility] will not give him his medications and he has been off of them for the past 4 days.  I had a discussion with the prison guards who will make sure he is evaluated by the facility nurses and these medications started.  Final Clinical Impression(s) / ED Diagnoses Final diagnoses:  None    Rx / DC  Orders ED Discharge Orders     None         Veryl Speak, MD 04/29/22 [phone]

## 2022-04-29 NOTE — Discharge Instructions (Signed)
Take ibuprofen 600 mg every 6 hours as needed for pain.  Resume prior medications as prescribed.  Follow-up with your cardiologist later this week, and return to the ER if symptoms significantly worsen or change.

## 2022-06-04 ENCOUNTER — Other Ambulatory Visit: Payer: Self-pay

## 2022-06-04 ENCOUNTER — Emergency Department (HOSPITAL_COMMUNITY)

## 2022-06-04 ENCOUNTER — Observation Stay (HOSPITAL_COMMUNITY)
Admission: EM | Admit: 2022-06-04 | Discharge: 2022-06-05 | Disposition: A | Discharge status: Home / Self Care | Attending: Internal Medicine | Admitting: Internal Medicine

## 2022-06-04 DIAGNOSIS — Z96641 Presence of right artificial hip joint: Secondary | ICD-10-CM | POA: Insufficient documentation

## 2022-06-04 DIAGNOSIS — Z955 Presence of coronary angioplasty implant and graft: Secondary | ICD-10-CM | POA: Diagnosis not present

## 2022-06-04 DIAGNOSIS — G4733 Obstructive sleep apnea (adult) (pediatric): Secondary | ICD-10-CM | POA: Diagnosis present

## 2022-06-04 DIAGNOSIS — F17210 Nicotine dependence, cigarettes, uncomplicated: Secondary | ICD-10-CM | POA: Diagnosis not present

## 2022-06-04 DIAGNOSIS — Z79899 Other long term (current) drug therapy: Secondary | ICD-10-CM | POA: Insufficient documentation

## 2022-06-04 DIAGNOSIS — Z8673 Personal history of transient ischemic attack (TIA), and cerebral infarction without residual deficits: Secondary | ICD-10-CM | POA: Insufficient documentation

## 2022-06-04 DIAGNOSIS — I2510 Atherosclerotic heart disease of native coronary artery without angina pectoris: Secondary | ICD-10-CM | POA: Diagnosis present

## 2022-06-04 DIAGNOSIS — R778 Other specified abnormalities of plasma proteins: Secondary | ICD-10-CM | POA: Diagnosis not present

## 2022-06-04 DIAGNOSIS — I10 Essential (primary) hypertension: Secondary | ICD-10-CM | POA: Diagnosis not present

## 2022-06-04 DIAGNOSIS — R0789 Other chest pain: Principal | ICD-10-CM | POA: Insufficient documentation

## 2022-06-04 DIAGNOSIS — R079 Chest pain, unspecified: Secondary | ICD-10-CM

## 2022-06-04 DIAGNOSIS — I959 Hypotension, unspecified: Secondary | ICD-10-CM | POA: Diagnosis not present

## 2022-06-04 DIAGNOSIS — R7989 Other specified abnormal findings of blood chemistry: Secondary | ICD-10-CM

## 2022-06-04 LAB — CBC
HCT: 42.4 % (ref 39.0–52.0)
Hemoglobin: 14.7 g/dL (ref 13.0–17.0)
MCH: 33.9 pg (ref 26.0–34.0)
MCHC: 34.7 g/dL (ref 30.0–36.0)
MCV: 97.7 fL (ref 80.0–100.0)
Platelets: 185 10*3/uL (ref 150–400)
RBC: 4.34 MIL/uL (ref 4.22–5.81)
RDW: 11.9 % (ref 11.5–15.5)
WBC: 7 10*3/uL (ref 4.0–10.5)
nRBC: 0 % (ref 0.0–0.2)

## 2022-06-04 LAB — TROPONIN I (HIGH SENSITIVITY)
Troponin I (High Sensitivity): 19 ng/L — ABNORMAL HIGH (ref <18)
Troponin I (High Sensitivity): 22 ng/L — ABNORMAL HIGH (ref <18)

## 2022-06-04 LAB — BASIC METABOLIC PANEL
Anion gap: 9 (ref 5–15)
BUN: 10 mg/dL (ref 8–23)
CO2: 26 mmol/L (ref 22–32)
Calcium: 8.7 mg/dL — ABNORMAL LOW (ref 8.9–10.3)
Chloride: 106 mmol/L (ref 98–111)
Creatinine, Ser: 1.12 mg/dL (ref 0.61–1.24)
GFR, Estimated: >60 mL/min (ref >60)
Glucose, Bld: 129 mg/dL — ABNORMAL HIGH (ref 70–99)
Potassium: 4.8 mmol/L (ref 3.5–5.1)
Sodium: 141 mmol/L (ref 135–145)

## 2022-06-04 LAB — CBG MONITORING, ED: Glucose-Capillary: 128 mg/dL — ABNORMAL HIGH (ref 70–99)

## 2022-06-04 MED ORDER — ENOXAPARIN SODIUM 40 MG/0.4ML IJ SOSY
40.0000 mg | PREFILLED_SYRINGE | INTRAMUSCULAR | Status: DC
  Administered 2022-06-05: 40 mg via SUBCUTANEOUS
  Filled 2022-06-04: qty 0.4

## 2022-06-04 MED ORDER — ACETAMINOPHEN 650 MG RE SUPP: 650.0000 mg | Freq: Four times a day (QID) | RECTAL | Status: DC | PRN

## 2022-06-04 MED ORDER — CLOPIDOGREL BISULFATE 75 MG PO TABS
75.0000 mg | ORAL_TABLET | Freq: Every day | ORAL | Status: DC
  Administered 2022-06-05: 75 mg via ORAL
  Filled 2022-06-04: qty 1

## 2022-06-04 MED ORDER — NITROGLYCERIN 0.4 MG SL SUBL
0.4000 mg | SUBLINGUAL_TABLET | SUBLINGUAL | Status: DC | PRN
  Administered 2022-06-04 – 2022-06-05 (×4): 0.4 mg via SUBLINGUAL
  Filled 2022-06-04: qty 1

## 2022-06-04 MED ORDER — MORPHINE SULFATE (PF) 2 MG/ML IV SOLN
1.0000 mg | INTRAVENOUS | Status: AC | PRN
  Administered 2022-06-05 (×2): 1 mg via INTRAVENOUS
  Filled 2022-06-04 (×3): qty 1

## 2022-06-04 MED ORDER — LEVETIRACETAM 500 MG PO TABS
500.0000 mg | ORAL_TABLET | Freq: Two times a day (BID) | ORAL | Status: DC
  Administered 2022-06-04 – 2022-06-05 (×2): 500 mg via ORAL
  Filled 2022-06-04 (×2): qty 1

## 2022-06-04 MED ORDER — ACETAMINOPHEN 325 MG PO TABS: 650.0000 mg | ORAL_TABLET | Freq: Four times a day (QID) | ORAL | Status: DC | PRN

## 2022-06-04 MED ORDER — ONDANSETRON HCL 4 MG PO TABS: 4.0000 mg | ORAL_TABLET | Freq: Four times a day (QID) | ORAL | Status: DC | PRN

## 2022-06-04 MED ORDER — ASPIRIN 81 MG PO TBEC
81.0000 mg | DELAYED_RELEASE_TABLET | Freq: Every day | ORAL | Status: DC
  Administered 2022-06-05: 81 mg via ORAL
  Filled 2022-06-04: qty 1

## 2022-06-04 MED ORDER — RANOLAZINE ER 500 MG PO TB12
500.0000 mg | ORAL_TABLET | Freq: Two times a day (BID) | ORAL | Status: DC
  Administered 2022-06-05 (×2): 500 mg via ORAL
  Filled 2022-06-04 (×2): qty 1

## 2022-06-04 MED ORDER — PANTOPRAZOLE SODIUM 40 MG PO TBEC
40.0000 mg | DELAYED_RELEASE_TABLET | Freq: Every day | ORAL | Status: DC
  Administered 2022-06-05: 40 mg via ORAL
  Filled 2022-06-04: qty 1

## 2022-06-04 MED ORDER — ONDANSETRON HCL 4 MG/2ML IJ SOLN: 4.0000 mg | Freq: Four times a day (QID) | INTRAMUSCULAR | Status: DC | PRN

## 2022-06-04 MED ORDER — MORPHINE SULFATE (PF) 4 MG/ML IV SOLN
4.0000 mg | Freq: Once | INTRAVENOUS | Status: AC
  Administered 2022-06-04: 4 mg via INTRAVENOUS
  Filled 2022-06-04: qty 1

## 2022-06-04 MED ORDER — ATORVASTATIN CALCIUM 40 MG PO TABS
80.0000 mg | ORAL_TABLET | Freq: Every day | ORAL | Status: DC
  Administered 2022-06-05: 80 mg via ORAL
  Filled 2022-06-04: qty 2

## 2022-06-04 NOTE — ED Triage Notes (Signed)
[facility] that began this afternoon. Pt was given Nitro and baby ASA.

## 2022-06-04 NOTE — ED Provider Notes (Addendum)
[facility] AT [facility] Provider Note   CSN: [id number] Arrival date & time: 06/04/22  1552     History  Chief Complaint  Patient presents with   Chest Pain    Connor Baker is a 62 y.o. male.   Chest Pain Patient presents with chest pain.  Began this afternoon.  Anterior chest with pressure.  Similar to previous chest pain.  States he is due to have a three-vessel bypass.  States this is both came he gets but this 1 has not gone away.  States had brief relief with nitroglycerin.  No swelling his legs.  No cough.    Past Medical History:  Diagnosis Date   Arthritis    "legs" (08/28/2016)   Chronic lower back pain    CVA (cerebral vascular accident) (HCC)    Depression    GERD (gastroesophageal reflux disease)    High cholesterol    Hypertension    MI (myocardial infarction) (HCC) 1992; 1994; 1995   MI (myocardial infarction) (HCC)    [phone]   On home oxygen therapy    "4L just at night w/my CPAP" (08/28/2016)   OSA (obstructive sleep apnea)    w/oxygen" (08/28/2016)   Stroke [facility] 2005   "mild one; faded my memory" (08/28/2016)    Home Medications Prior to Admission medications   Medication Sig Start Date End Date Taking? Authorizing Provider  acetaminophen (TYLENOL) 500 MG tablet Take 500-1,000 mg by mouth every 6 (six) hours as needed for mild pain (or headaches).    [provider]  ascorbic acid (VITAMIN C) 500 MG tablet Take 500 mg by mouth daily.    [provider]  aspirin EC 81 MG EC tablet Take 1 tablet (81 mg total) by mouth daily. Swallow whole. 05/16/20   Filbert Schilder, NP  atorvastatin (LIPITOR) 80 MG tablet Take 1 tablet (80 mg total) by mouth daily. 05/16/20   Georgie Chard D, NP  cholecalciferol (VITAMIN D3) 25 MCG (1000 UNIT) tablet Take 1,000 Units by mouth daily.    [provider]  clopidogrel (PLAVIX) 75 MG tablet Take 1 tablet (75 mg total) by mouth daily. 05/15/20   Filbert Schilder,  NP  levETIRAcetam (KEPPRA) 500 MG tablet Take 1 tablet (500 mg total) by mouth 2 (two) times daily. 07/23/20   Berna Bue, MD  loratadine (CLARITIN) 10 MG tablet Take 10 mg by mouth at bedtime.    [provider]  naproxen sodium (ALEVE) 220 MG tablet Take 220-440 mg by mouth 2 (two) times daily as needed (for mild pain or headaches).    [provider]  nitroGLYCERIN (NITROSTAT) 0.4 MG SL tablet Place 1 tablet (0.4 mg total) under the tongue every 5 (five) minutes x 3 doses as needed for chest pain. 01/02/18   Filbert Schilder, NP  omega-3 acid ethyl esters (LOVAZA) 1 g capsule Take 1 capsule (1 g total) by mouth 2 (two) times daily. 04/08/22   Rai, Ripudeep K, MD  pantoprazole (PROTONIX) 40 MG tablet Take 1 tablet (40 mg total) by mouth daily. 04/09/22   Rai, Delene Ruffini, MD  ranolazine (RANEXA) 500 MG 12 hr tablet Take 1 tablet (500 mg total) by mouth 2 (two) times daily. 11/26/20   Tilden Fossa, MD      Allergies    Patient has no known allergies.    Review of Systems   Review of Systems  Cardiovascular:  Positive for chest pain.  Physical Exam Updated Vital Signs BP (!) 113/94   Pulse (!) 57   Temp 98.7 F (37.1 C) (Oral)   Resp 14   SpO2 93%  Physical Exam Vitals and nursing note reviewed.  Cardiovascular:     Rate and Rhythm: Normal rate and regular rhythm.  Pulmonary:     Breath sounds: No wheezing, rhonchi or rales.  Chest:     Chest wall: No tenderness.  Musculoskeletal:     Right lower leg: No edema.     Left lower leg: No edema.  Neurological:     Mental Status: He is alert.     ED Results / Procedures / Treatments   Labs (all labs ordered are listed, but only abnormal results are displayed) Labs Reviewed  BASIC METABOLIC PANEL - Abnormal; Notable for the following components:      Result Value   Glucose, Bld 129 (*)    Calcium 8.7 (*)    All other components within normal limits  CBG MONITORING, ED - Abnormal; Notable for the  following components:   Glucose-Capillary 128 (*)    All other components within normal limits  TROPONIN I (HIGH SENSITIVITY) - Abnormal; Notable for the following components:   Troponin I (High Sensitivity) 19 (*)    All other components within normal limits  CBC  TROPONIN I (HIGH SENSITIVITY)    EKG EKG Interpretation  Date/Time:  Wednesday June 04 2022 15:58:21 EDT Ventricular Rate:  67 PR Interval:  141 QRS Duration: 92 QT Interval:  400 QTC Calculation: 423 R Axis:   76 Text Interpretation: Sinus rhythm Abnormal R-wave progression, early transition No significant change since last tracing Confirmed by Benjiman CorePickering, Kameria Canizares [phone]) on 06/04/2022 4:05:25 PM  Radiology DG Chest 2 View  Result Date: 06/04/2022 CLINICAL DATA:  Chest pain. EXAM: CHEST - 2 VIEW COMPARISON:  04/29/2022 FINDINGS: Lungs are adequately inflated without airspace consolidation or effusion. Cardiomediastinal silhouette and remainder of the exam is unchanged. IMPRESSION: No active cardiopulmonary disease. Electronically Signed   By: Elberta Fortisaniel  Boyle M.D.   On: 06/04/2022 16:29    Procedures Procedures    Medications Ordered in ED Medications  nitroGLYCERIN (NITROSTAT) SL tablet 0.4 mg (0.4 mg Sublingual Given 06/04/22 1702)  morphine (PF) 4 MG/ML injection 4 mg (4 mg Intravenous Given 06/04/22 1735)    ED Course/ Medical Decision Making/ A&P                             Medical Decision Making Amount and/or Complexity of Data Reviewed Labs: ordered. Radiology: ordered.  Risk Prescription drug management. Decision regarding hospitalization.   Patient denies of chest pain.  Anterior chest.  History of same.  EKG stable.  However has known coronary disease.  I reviewed recent cath report, and recent cardiology note.  The note does not mention a planned CABG.  Did have some chronic disease but no new disease.  Differential diagnosis includes chest pain, cardiac disease, pneumonia.  Will get basic blood  work.  Will get chest x-ray.  Will follow troponins.  Will give nitroglycerin.  Nitroglycerin did not really help the pain.  Patient states morphine had helped.  Troponin however is mildly elevated at 19.  Has been normal previously.  Discussed with Dr. Gery PrayBarry who reviewed the catheter report from 2 months ago.  With the elevated troponin thinks would benefit from medicine admission and following troponins.  If remains flat can go back tomorrow.  Discussed with hospitalist and then with cardiology fellow.  Recommends delta troponins followed by echocardiogram.  As long as pain remains flat and troponins do not go up and echo reassuring and should be able to go home.  Recommends getting watched overnight however.  Does not recommend heparin.        Final Clinical Impression(s) / ED Diagnoses Final diagnoses:  Chest pain, unspecified type  Elevated troponin    Rx / DC Orders ED Discharge Orders     None         Benjiman Core, MD 06/04/22 1846    Benjiman Core, MD 06/04/22 2148

## 2022-06-04 NOTE — Hospital Course (Signed)
Connor Baker is a 62 y.o. male with medical history significant for CAD s/p prior stenting, history of CVA, HTN, HLD, seizure disorder, OSA on CPAP who is admitted with chest pain.

## 2022-06-04 NOTE — H&P (Signed)
History and Physical    Connor Baker:[id number] DOB: 1960-09-08 DOA: 06/04/2022  PCP: [facility]  Patient coming from: Home  I have personally briefly reviewed patient's old medical records in [facility]  Chief Complaint: Chest pain  HPI: Connor Baker is a 62 y.o. male with medical history significant for CAD s/p prior stenting, history of CVA, HTN, HLD, seizure disorder, OSA on CPAP who [facility] evaluation of chest pain.  Patient reports central pressure-like chest pain beginning around 2-3 PM on 4/10.  This occurred after showering.  He had associated nausea, vomiting, diaphoresis.  Pain has been nonradiating.  He was seen by the infirmary and sent to the ED for further evaluation.  Chest pressure has been fairly constant with some improvement after receiving morphine.  He says nitroglycerin has not provided any relief.  ED Course  Labs/Imaging on admission: I have personally reviewed following labs and imaging studies.  Initial vitals showed BP 121/90, pulse 61, RR 15, temp 98.7 F, SpO2 97% on room air.  Labs show sodium 141, potassium 4.8, bicarb 26, BUN 10, creatinine 1.12, serum glucose 129, WBC 7.0, hemoglobin 14.7, platelets 185,000, troponin 19 > 22.  2 view chest x-ray negative for focal consolidation, edema, effusion.  Patient was given IV morphine 4 mg, nitroglycerin x 3.  EDP spoke with on-call cardiology Dr. Allyson Sabal and again with Dr. Hyacinth Meeker who recommended medical admission for overnight observation.  The hospitalist service was consulted to admit for further evaluation and management.  Review of Systems: All systems reviewed and are negative except as documented in history of present illness above.   Past Medical History:  Diagnosis Date   Arthritis    "legs" (08/28/2016)   Chronic lower back pain    CVA (cerebral vascular accident) (HCC)    Depression    GERD (gastroesophageal reflux disease)    High cholesterol     Hypertension    MI (myocardial infarction) (HCC) 1992; 1994; 1995   MI (myocardial infarction) (HCC)    [phone]   On home oxygen therapy    "4L just at night w/my CPAP" (08/28/2016)   OSA (obstructive sleep apnea)    w/oxygen" (08/28/2016)   Stroke (HCC) 2005   "mild one; faded my memory" (08/28/2016)    Past Surgical History:  Procedure Laterality Date   ANKLE FRACTURE SURGERY Right 2016   CORONARY ANGIOPLASTY     CORONARY ANGIOPLASTY WITH STENT PLACEMENT  1992 - 2017   "I've got a total of 16 stents in me" (08/28/2016)   CORONARY BALLOON ANGIOPLASTY Right 08/28/2016   Procedure: Coronary Balloon Angioplasty;  Surgeon: Lyn Records, MD;  Location: [facility] INVASIVE CV LAB;  Service: Cardiovascular;  Laterality: Right;   FRACTURE SURGERY     LEFT HEART CATH AND CORONARY ANGIOGRAPHY N/A 08/28/2016   Procedure: Left Heart Cath and Coronary Angiography;  Surgeon: Lyn Records, MD;  Location: [facility] INVASIVE CV LAB;  Service: Cardiovascular;  Laterality: N/A;   LEFT HEART CATH AND CORONARY ANGIOGRAPHY N/A 10/24/2016   Procedure: LEFT HEART CATH AND CORONARY ANGIOGRAPHY;  Surgeon: [place], Peter M, MD;  Location: [facility] INVASIVE CV LAB;  Service: Cardiovascular;  Laterality: N/A;   LEFT HEART CATH AND CORONARY ANGIOGRAPHY N/A 04/07/2022   Procedure: LEFT HEART CATH AND CORONARY ANGIOGRAPHY;  Surgeon: Runell Gess, MD;  Location: [facility] INVASIVE CV LAB;  Service: Cardiovascular;  Laterality: N/A;   TOTAL HIP ARTHROPLASTY Right 1990    Social History:  reports that he has been smoking cigarettes. He has a 1.00 pack-year smoking history. He has never used smokeless tobacco. He reports current alcohol use. He reports that he does not currently use drugs.  No Known Allergies  Family History  Problem Relation Age of Onset   Diabetes Mother    Heart attack Father 61     Prior to Admission medications   Medication Sig Start Date End Date Taking? Authorizing Provider  acetaminophen (TYLENOL) 500 MG  tablet Take 500-1,000 mg by mouth every 6 (six) hours as needed for mild pain (or headaches).    [provider]  ascorbic acid (VITAMIN C) 500 MG tablet Take 500 mg by mouth daily.    [provider]  aspirin EC 81 MG EC tablet Take 1 tablet (81 mg total) by mouth daily. Swallow whole. 05/16/20   Filbert Schilder, NP  atorvastatin (LIPITOR) 80 MG tablet Take 1 tablet (80 mg total) by mouth daily. 05/16/20   Georgie Chard D, NP  cholecalciferol (VITAMIN D3) 25 MCG (1000 UNIT) tablet Take 1,000 Units by mouth daily.    [provider]  clopidogrel (PLAVIX) 75 MG tablet Take 1 tablet (75 mg total) by mouth daily. 05/15/20   Filbert Schilder, NP  levETIRAcetam (KEPPRA) 500 MG tablet Take 1 tablet (500 mg total) by mouth 2 (two) times daily. 07/23/20   Berna Bue, MD  loratadine (CLARITIN) 10 MG tablet Take 10 mg by mouth at bedtime.    [provider]  naproxen sodium (ALEVE) 220 MG tablet Take 220-440 mg by mouth 2 (two) times daily as needed (for mild pain or headaches).    [provider]  nitroGLYCERIN (NITROSTAT) 0.4 MG SL tablet Place 1 tablet (0.4 mg total) under the tongue every 5 (five) minutes x 3 doses as needed for chest pain. 01/02/18   Filbert Schilder, NP  omega-3 acid ethyl esters (LOVAZA) 1 g capsule Take 1 capsule (1 g total) by mouth 2 (two) times daily. 04/08/22   Rai, Ripudeep K, MD  pantoprazole (PROTONIX) 40 MG tablet Take 1 tablet (40 mg total) by mouth daily. 04/09/22   Rai, Delene Ruffini, MD  ranolazine (RANEXA) 500 MG 12 hr tablet Take 1 tablet (500 mg total) by mouth 2 (two) times daily. 11/26/20   Tilden Fossa, MD    Physical Exam: Vitals:   06/04/22 2130 06/04/22 2200 06/04/22 2230 06/04/22 2258  BP: 102/79 118/82 (!) 117/92   Pulse: (!) 51 (!) 53 (!) 54   Resp: 11 11 13    Temp:    98.1 F (36.7 C)  TempSrc:    Oral  SpO2: 95% 95% 94%    Constitutional: Resting in bed, NAD, calm, comfortable Eyes: EOMI, lids and  conjunctivae normal ENMT: Mucous membranes are moist. Posterior pharynx clear of any exudate or lesions.Normal dentition.  Neck: normal, supple, no masses. Respiratory: clear to auscultation bilaterally, no wheezing, no crackles. Normal respiratory effort. No accessory muscle use.  Cardiovascular: Regular rate and rhythm, no murmurs / rubs / gallops. No extremity edema. 2+ pedal pulses. Abdomen: no tenderness, no masses palpated. No hepatosplenomegaly. Bowel sounds positive.  Musculoskeletal: no clubbing / cyanosis. No joint deformity upper and lower extremities. Good ROM, no contractures. Normal muscle tone.  Skin: no rashes, lesions, ulcers. No induration Neurologic:  Sensation intact. Strength 5/5 in all 4.  Psychiatric: Alert and oriented x 3. Normal mood.   EKG: Personally reviewed. Sinus rhythm, rate 67, no acute ischemic changes.  Assessment/Plan Principal Problem:   Chest pain Active Problems:   CAD (coronary artery disease), native coronary artery   OSA (obstructive sleep apnea)   Jamaurion D Scherzer is a 62 y.o. male with medical history significant for CAD s/p prior stenting, history of CVA, HTN, HLD, seizure disorder, OSA on CPAP who is admitted with chest pain.  Assessment and Plan: Chest pain/history of CAD with prior stenting: Presenting with midsternal chest pressure.  Troponin minimally elevated.  EKG without acute ischemic changes.  Recent LHC 04/07/2022 showed patent RCA stent, 70% stenosis ostial cx to prox cx, 50% stenosed mid LAD lesion, 70% stenosis RPAV lesion, 100% stenosis of first RPL lesion. Cardiology has recommended overnight observation with repeat echocardiogram. -Keep on telemetry -Continue aspirin, Plavix, atorvastatin, Ranexa -Not on beta-blocker or Imdur due to history of low blood pressures -Obtain limited echo  History of seizures: Continue Keppra.  OSA: Continue CPAP nightly.   DVT prophylaxis: enoxaparin (LOVENOX) injection 40 mg Start: 06/05/22  0800 Code Status: Full code, confirmed with patient on admission Family Communication: Discussed with patient [place] [facility] Consults called: EDP discussed with on-call Cardiology Severity of Illness: The appropriate patient status for this patient is OBSERVATION. Observation status is judged to be reasonable and necessary in order to provide the required intensity of service to ensure the patient's safety. The patient's presenting symptoms, physical exam findings, and initial radiographic and laboratory data in the context of their medical condition is felt to place them at decreased risk for further clinical deterioration. Furthermore, it is anticipated that the patient will be medically stable for discharge from the hospital within 2 midnights of admission.   Darreld McleanVishal Elanah Osmanovic MD [facility]  If 7PM-7AM, please contact night-coverage [email or web address]  06/04/2022, 11:43 PM

## 2022-06-05 ENCOUNTER — Observation Stay (HOSPITAL_COMMUNITY)

## 2022-06-05 ENCOUNTER — Encounter (HOSPITAL_COMMUNITY): Payer: Self-pay | Admitting: Internal Medicine

## 2022-06-05 DIAGNOSIS — R7989 Other specified abnormal findings of blood chemistry: Secondary | ICD-10-CM

## 2022-06-05 DIAGNOSIS — I25110 Atherosclerotic heart disease of native coronary artery with unstable angina pectoris: Secondary | ICD-10-CM | POA: Diagnosis not present

## 2022-06-05 DIAGNOSIS — R072 Precordial pain: Secondary | ICD-10-CM

## 2022-06-05 DIAGNOSIS — R079 Chest pain, unspecified: Secondary | ICD-10-CM

## 2022-06-05 DIAGNOSIS — G4733 Obstructive sleep apnea (adult) (pediatric): Secondary | ICD-10-CM | POA: Diagnosis not present

## 2022-06-05 LAB — CBC
HCT: 42.3 % (ref 39.0–52.0)
Hemoglobin: 15.2 g/dL (ref 13.0–17.0)
MCH: 34.3 pg — ABNORMAL HIGH (ref 26.0–34.0)
MCHC: 35.9 g/dL (ref 30.0–36.0)
MCV: 95.5 fL (ref 80.0–100.0)
Platelets: 176 10*3/uL (ref 150–400)
RBC: 4.43 MIL/uL (ref 4.22–5.81)
RDW: 12 % (ref 11.5–15.5)
WBC: 8.9 10*3/uL (ref 4.0–10.5)
nRBC: 0 % (ref 0.0–0.2)

## 2022-06-05 LAB — BASIC METABOLIC PANEL
Anion gap: 8 (ref 5–15)
BUN: 12 mg/dL (ref 8–23)
CO2: 25 mmol/L (ref 22–32)
Calcium: 8.7 mg/dL — ABNORMAL LOW (ref 8.9–10.3)
Chloride: 104 mmol/L (ref 98–111)
Creatinine, Ser: 1.07 mg/dL (ref 0.61–1.24)
GFR, Estimated: >60 mL/min (ref >60)
Glucose, Bld: 98 mg/dL (ref 70–99)
Potassium: 4.3 mmol/L (ref 3.5–5.1)
Sodium: 137 mmol/L (ref 135–145)

## 2022-06-05 LAB — ECHOCARDIOGRAM LIMITED
Area-P 1/2: 1.93 cm2
Calc EF: 57.2 %
S' Lateral: 3 cm
Single Plane A2C EF: 56.7 %
Single Plane A4C EF: 57.8 %

## 2022-06-05 LAB — HIV ANTIBODY (ROUTINE TESTING W REFLEX): HIV Screen 4th Generation wRfx: NONREACTIVE

## 2022-06-05 LAB — TROPONIN I (HIGH SENSITIVITY): Troponin I (High Sensitivity): 20 ng/L — ABNORMAL HIGH (ref <18)

## 2022-06-05 MED ORDER — MORPHINE SULFATE (PF) 2 MG/ML IV SOLN
1.0000 mg | INTRAVENOUS | Status: DC | PRN
  Administered 2022-06-05 (×2): 1 mg via INTRAVENOUS
  Filled 2022-06-05 (×2): qty 1

## 2022-06-05 NOTE — Progress Notes (Signed)
[facility]                                                                              Connor Baker, is a 62 y.o. male, DOB - 06/19/1960, CVE:[id number] Admit date - 06/04/2022    Outpatient Primary MD for the patient is [facility]  LOS - 0  days  Chief Complaint  Patient presents with   Chest Pain       Brief summary   Patient is a 62 year old male with CAD status post prior PCI, CVA, HTN, HLD, seizures, OSA on CPAP presented from jail for evaluation of chest pain.  Patient reported central pressure-like chest pain beginning around 2 to 3 PM on 4/10, occurred after showering with associated nausea, vomiting, diaphoresis, nonradiating.  He was sent to ED for further evaluation.  Patient reports constant chest pain with some improvement after receiving morphine.  In ED, vital signs stable,  Labs show sodium 141, potassium 4.8, bicarb 26, BUN 10, creatinine 1.12, serum glucose 129, WBC 7.0, hemoglobin 14.7, platelets 185,000, troponin 19 > 22.  2 view chest x-ray negative for focal consolidation, edema, effusion.  Assessment & Plan    Principal Problem:   Chest pain with history of extensive CAD -Presented with constant midsternal chest pressure, troponins minimally elevated,19-22-20 -EKG without acute ischemic changes.  Chest x-ray with no infiltrates. -Patient had previous left heart cath on 04/07/2022 which showed patent RCA stent, 70% stenosis ostial cx to prox cx, 50% stenosed mid LAD lesion, 70% stenosis RPAV lesion, 100% stenosis of first RPL lesion.  Patient was recommended medical management. -Complaining of chest pain, states just had received nitroglycerin with no significant effect, ordered morphine IV as needed -Also reported somebody stating that he will need CABG, requested cardiology consult.  Follow 2D echo -Seen by cardiology this morning, Dr. Eden Emms, recommended okay to DC after echo if showed normal EF, no R WMA's and will follow  outpatient -Continue Ranexa, aspirin, Lipitor, Plavix.  No beta-blocker due to low BP and bradycardia  Active Problems: Hyperlipidemia -Continue statin    OSA (obstructive sleep apnea) -Continue CPAP qhs  History of seizures Continue Keppra  Obesity Estimated body mass index is 31.63 kg/m as calculated from the following:   Height as of 04/29/22: 5\' 10"  (1.778 m).   Weight as of 04/29/22: 100 kg.  Code Status: Full code DVT Prophylaxis:  enoxaparin (LOVENOX) injection 40 mg Start: 06/05/22 0800   Level of Care: Level of care: Telemetry Cardiac Family Communication:  Disposition Plan:      Remains inpatient appropriate: Possible [facility] back [facility] today if 2D echo has no abnormality   Procedures:    Consultants:   Cardiology  Antimicrobials:   Anti-infectives (From admission, onward)    None          Medications  aspirin EC  81 mg Oral Daily   atorvastatin  80 mg Oral Daily   clopidogrel  75 mg Oral Daily   enoxaparin (LOVENOX) injection  40 mg Subcutaneous Q24H   levETIRAcetam  500 mg Oral BID   pantoprazole  40 mg Oral Daily   ranolazine  500 mg Oral BID      Subjective:   Connor Baker was seen and examined today.  Complaining of constant chest pain, midsternal, states nitroglycerin did not help, morphine helps.  No dizziness, shortness of breath, nausea or diaphoresis.    Objective:   Vitals:   06/05/22 0618 06/05/22 0630 06/05/22 0700 06/05/22 0730  BP:  113/76 120/79 106/77  Pulse:  (!) 49 (!) 45 (!) 45  Resp:  (!) 9 10 12   Temp: 97.8 F (36.6 C)     TempSrc: Oral     SpO2:  92% 94% 93%   No intake or output data in the 24 hours ending 06/05/22 0927   Wt Readings from Last 3 Encounters:  04/29/22 100 kg  04/08/22 100 kg  12/28/21 95.3 kg     Exam General: Alert and oriented x 3, NAD Cardiovascular: S1 S2 auscultated,  RRR Respiratory: CTAB Gastrointestinal: Soft, nontender, nondistended, + bowel sounds Ext: no pedal edema  bilaterally Neuro: Strength 5/5 upper and lower extremities bilaterally Psych: Normal affect     Data Reviewed:  I have personally reviewed following labs    CBC Lab Results  Component Value Date   WBC 8.9 06/05/2022   RBC 4.43 06/05/2022   HGB 15.2 06/05/2022   HCT 42.3 06/05/2022   MCV 95.5 06/05/2022   MCH 34.3 (H) 06/05/2022   PLT 176 06/05/2022   MCHC 35.9 06/05/2022   RDW 12.0 06/05/2022   LYMPHSABS 3.5 04/05/2022   MONOABS 0.9 04/05/2022   EOSABS 0.3 04/05/2022   BASOSABS 0.1 04/05/2022     Last metabolic panel Lab Results  Component Value Date   NA 137 06/05/2022   K 4.3 06/05/2022   CL 104 06/05/2022   CO2 25 06/05/2022   BUN 12 06/05/2022   CREATININE 1.07 06/05/2022   GLUCOSE 98 06/05/2022   GFRNONAA >60 06/05/2022   GFRAA >60 10/12/2019   CALCIUM 8.7 (L) 06/05/2022   PROT 6.6 04/05/2022   ALBUMIN 3.7 04/05/2022   BILITOT 1.1 04/05/2022   ALKPHOS 60 04/05/2022   AST 32 04/05/2022   ALT 51 (H) 04/05/2022   ANIONGAP 8 06/05/2022    CBG (last 3)  Recent Labs    06/04/22 1555  GLUCAP 128*      Coagulation Profile: No results for input(s): "INR", "PROTIME" in the last 168 hours.   Radiology Studies: I have personally reviewed the imaging studies  DG Chest 2 View  Result Date: 06/04/2022 CLINICAL DATA:  Chest pain. EXAM: CHEST - 2 VIEW COMPARISON:  04/29/2022 FINDINGS: Lungs are adequately inflated without airspace consolidation or effusion. Cardiomediastinal silhouette and remainder of the exam is unchanged. IMPRESSION: No active cardiopulmonary disease. Electronically Signed   By: Elberta Fortis M.D.   On: 06/04/2022 16:29       Leith Szafranski M.D. [facility] 06/05/2022, 9:27 AM  Available via Epic secure chat 7am-7pm After 7 pm, please refer to night coverage provider listed on amion.

## 2022-06-05 NOTE — Progress Notes (Signed)
Echocardiogram 2D Echocardiogram has been performed.  Connor Baker 06/05/2022, 10:32 AM

## 2022-06-05 NOTE — Consult Note (Signed)
CARDIOLOGY CONSULT NOTE       Patient ID: Connor Baker MRN: [id number] DOB/AGE: 64/27/1962 64 y.o.  Admit date: 06/04/2022 Referring Physician: Allena Katz Primary Physician: [facility] Primary Cardiologist: Clifton James Reason for Consultation: Chest Pain  Principal Problem:   Chest pain Active Problems:   CAD (coronary artery disease), native coronary artery   OSA (obstructive sleep apnea)   HPI:  62 y.o. coming in from prison with chest pain History of CAD Was just cathed 04/07/22 with stable dx patent stent to distal RCA with occluded PDA with collaterals and moderate ostial LCX/ distal LAD dx Medical Rx warranted Yesterday afternoon had resting central chest pressure Started while showering Constant pain some improvement with MSO4 No gone R/O no acute ECG changes and CXR with NAD TTE done 04/05/22 with normal EF 60-65% Phone consult by fellow last night suggested limited TTE for EF/RWMA;s and if normal can be dc back to prison  ROS All other systems reviewed and negative except as noted above  Past Medical History:  Diagnosis Date   Arthritis    "legs" (08/28/2016)   Chronic lower back pain    CVA (cerebral vascular accident) (HCC)    Depression    GERD (gastroesophageal reflux disease)    High cholesterol    Hypertension    MI (myocardial infarction) (HCC) 1992; 1994; 1995   MI (myocardial infarction) (HCC)    [phone]   On home oxygen therapy    "4L just at night w/my CPAP" (08/28/2016)   OSA (obstructive sleep apnea)    w/oxygen" (08/28/2016)   Stroke (HCC) 2005   "mild one; faded my memory" (08/28/2016)    Family History  Problem Relation Age of Onset   Diabetes Mother    Heart attack Father 32    Social History   Socioeconomic History   Marital status: Single    Spouse name: Not on file   Number of children: Not on file   Years of education: Not on file   Highest education level: Not on file  Occupational History   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     Social Determinants of Health   Financial Resource Strain: High Risk (10/11/2017)   Overall Financial Resource Strain (CARDIA)    Difficulty of Paying Living Expenses: Hard  Food Insecurity: No Food Insecurity (06/04/2022)   Hunger Vital Sign    Worried About Running Out of Food in the Last Year: Never true    Ran Out of Food in the Last Year: Never true  Transportation Needs: No Transportation Needs (06/04/2022)   PRAPARE - Administrator, Civil Service (Medical): No    Lack of Transportation (Non-Medical): No  Physical Activity: Not on file  Stress: Not on file  Social Connections: Not on file  Intimate Partner Violence: Not At Risk (06/04/2022)  Humiliation, Afraid, Rape, and Kick questionnaire    Fear of Current or Ex-Partner: No    Emotionally Abused: No    Physically Abused: No    Sexually Abused: No    Past Surgical History:  Procedure Laterality Date   ANKLE FRACTURE SURGERY Right 2016   CORONARY ANGIOPLASTY     CORONARY ANGIOPLASTY WITH STENT PLACEMENT  1992 - 2017   "I've got a total of 16 stents in me" (08/28/2016)   CORONARY BALLOON ANGIOPLASTY Right 08/28/2016   Procedure: Coronary Balloon Angioplasty;  Surgeon: Lyn Records, MD;  Location: [facility];  Service: Cardiovascular;  Laterality: Right;   FRACTURE SURGERY     LEFT HEART CATH AND CORONARY ANGIOGRAPHY N/A 08/28/2016   Procedure: Left Heart Cath and Coronary Angiography;  Surgeon:  Lyn Records, MD;  Location: [facility] INVASIVE CV LAB;  Service: Cardiovascular;  Laterality: N/A;   LEFT HEART CATH AND CORONARY ANGIOGRAPHY N/A 10/24/2016   Procedure: LEFT HEART CATH AND CORONARY ANGIOGRAPHY;  Surgeon: [place], Aydin Hink M, MD;  Location: [facility] INVASIVE CV LAB;  Service: Cardiovascular;  Laterality: N/A;   LEFT HEART CATH AND CORONARY ANGIOGRAPHY N/A 04/07/2022   Procedure: LEFT HEART CATH AND CORONARY ANGIOGRAPHY;  Surgeon: Runell Gess, MD;  Location: [facility] INVASIVE CV LAB;  Service: Cardiovascular;  Laterality: N/A;   TOTAL HIP ARTHROPLASTY Right 1990      Current Facility-Administered Medications:    acetaminophen (TYLENOL) tablet 650 mg, 650 mg, Oral, Q6H PRN **OR** acetaminophen (TYLENOL) suppository 650 mg, 650 mg, Rectal, Q6H PRN, Charlsie Quest, MD   aspirin EC tablet 81 mg, 81 mg, Oral, Daily, Patel, Vishal R, MD   atorvastatin (LIPITOR) tablet 80 mg, 80 mg, Oral, Daily, Patel, Vishal R, MD   clopidogrel (PLAVIX) tablet 75 mg, 75 mg, Oral, Daily, Patel, Vishal R, MD   enoxaparin (LOVENOX) injection 40 mg, 40 mg, Subcutaneous, Q24H, Allena Katz, Vishal R, MD, 40 mg at 06/05/22 0747   levETIRAcetam (KEPPRA) tablet 500 mg, 500 mg, Oral, BID, Darreld Mclean R, MD, 500 mg at 06/04/22 2358   morphine (PF) 2 MG/ML injection 1 mg, 1 mg, Intravenous, Q3H PRN, Rai, Ripudeep K, MD   nitroGLYCERIN (NITROSTAT) SL tablet 0.4 mg, 0.4 mg, Sublingual, Q5 min PRN, Benjiman Core, MD, 0.4 mg at 06/05/22 0747   ondansetron (ZOFRAN) tablet 4 mg, 4 mg, Oral, Q6H PRN **OR** ondansetron (ZOFRAN) injection 4 mg, 4 mg, Intravenous, Q6H PRN, Allena Katz, Vishal R, MD   pantoprazole (PROTONIX) EC tablet 40 mg, 40 mg, Oral, Daily, Patel, Vishal R, MD   ranolazine (RANEXA) 12 hr tablet 500 mg, 500 mg, Oral, BID, Darreld Mclean R, MD, 500 mg at 06/05/22 0002  Current Outpatient Medications:    acetaminophen (TYLENOL) 500 MG tablet, Take 500-1,000 mg by mouth every 6 (six) hours as needed for mild pain (or  headaches)., Disp: , Rfl:    ascorbic acid (VITAMIN C) 500 MG tablet, Take 500 mg by mouth daily., Disp: , Rfl:    aspirin EC 81 MG EC tablet, Take 1 tablet (81 mg total) by mouth daily. Swallow whole., Disp: 90 tablet, Rfl: 6   atorvastatin (LIPITOR) 80 MG tablet, Take 1 tablet (80 mg total) by mouth daily., Disp: 90 tablet, Rfl: 6   cholecalciferol (VITAMIN D3) 25 MCG (1000 UNIT) tablet, Take 1,000 Units by mouth daily., Disp: , Rfl:    clopidogrel (PLAVIX) 75 MG tablet, Take 1 tablet (75 mg total) by mouth daily., Disp: 90 tablet, Rfl: 3  levETIRAcetam (KEPPRA) 500 MG tablet, Take 1 tablet (500 mg total) by mouth 2 (two) times daily., Disp: 10 tablet, Rfl: 0   loratadine (CLARITIN) 10 MG tablet, Take 10 mg by mouth at bedtime., Disp: , Rfl:    naproxen sodium (ALEVE) 220 MG tablet, Take 220-440 mg by mouth 2 (two) times daily as needed (for mild pain or headaches)., Disp: , Rfl:    nitroGLYCERIN (NITROSTAT) 0.4 MG SL tablet, Place 1 tablet (0.4 mg total) under the tongue every 5 (five) minutes x 3 doses as needed for chest pain., Disp: 25 tablet, Rfl: 0   omega-3 acid ethyl esters (LOVAZA) 1 g capsule, Take 1 capsule (1 g total) by mouth 2 (two) times daily., Disp: 60 capsule, Rfl: 1   pantoprazole (PROTONIX) 40 MG tablet, Take 1 tablet (40 mg total) by mouth daily., Disp: 30 tablet, Rfl: 1   ranolazine (RANEXA) 500 MG 12 hr tablet, Take 1 tablet (500 mg total) by mouth 2 (two) times daily., Disp: 90 tablet, Rfl: 0  aspirin EC  81 mg Oral Daily   atorvastatin  80 mg Oral Daily   clopidogrel  75 mg Oral Daily   enoxaparin (LOVENOX) injection  40 mg Subcutaneous Q24H   levETIRAcetam  500 mg Oral BID   pantoprazole  40 mg Oral Daily   ranolazine  500 mg Oral BID     Physical Exam: Blood pressure 106/77, pulse (!) 45, temperature 97.8 F (36.6 C), temperature source Oral, resp. rate 12, SpO2 93 %.    Affect appropriate Healthy:  appears stated age HEENT: normal Neck supple with no  adenopathy JVP normal no bruits no thyromegaly Lungs clear with no wheezing and good diaphragmatic motion Heart:  S1/S2 no murmur, no rub, gallop or click PMI normal Abdomen: benighn, BS positve, no tenderness, no AAA no bruit.  No HSM or HJR Distal pulses intact with no bruits No edema Neuro non-focal Skin warm and dry No muscular weakness   Labs:   Lab Results  Component Value Date   WBC 8.9 06/05/2022   HGB 15.2 06/05/2022   HCT 42.3 06/05/2022   MCV 95.5 06/05/2022   PLT 176 06/05/2022    Recent Labs  Lab 06/05/22 0420  NA 137  K 4.3  CL 104  CO2 25  BUN 12  CREATININE 1.07  CALCIUM 8.7*  GLUCOSE 98   Lab Results  Component Value Date   TROPONINI <0.03 01/01/2018    Lab Results  Component Value Date   CHOL 116 04/06/2022   CHOL 125 05/15/2020   CHOL 125 08/28/2016   Lab Results  Component Value Date   HDL 39 (L) 04/06/2022   HDL 52 05/15/2020   HDL 74 08/28/2016   Lab Results  Component Value Date   LDLCALC 35 04/06/2022   LDLCALC 57 05/15/2020   LDLCALC 40 08/28/2016   Lab Results  Component Value Date   TRIG 208 (H) 04/06/2022   TRIG 80 05/15/2020   TRIG 57 08/28/2016   Lab Results  Component Value Date   CHOLHDL 3.0 04/06/2022   CHOLHDL 2.4 05/15/2020   CHOLHDL 1.7 08/28/2016   No results found for: "LDLDIRECT"    Radiology: DG Chest 2 View  Result Date: 06/04/2022 CLINICAL DATA:  Chest pain. EXAM: CHEST - 2 VIEW COMPARISON:  04/29/2022 FINDINGS: Lungs are adequately inflated without airspace consolidation or effusion. Cardiomediastinal silhouette and remainder of the exam is unchanged. IMPRESSION: No active cardiopulmonary disease. Electronically Signed   By: Reuel Boom  Micheline MazeBoyle M.D.   On: 06/04/2022 16:29    EKG: SR no acute changes    ASSESSMENT AND PLAN:   Chest pain:  seems non cardiac Resolved No acute ECG changes negative troponin and recent cath with stable anatomy Ok to dc after limited echo if EF normal and no RWMA;s CXR  also normal with no CE or infiltrate Interestingly he is on Ranexa Not sure why this rather than imdur given collaterals that are known and cost  2. HLD:  continue statin  3.  Seizures :  quiescent continue Keppra   Will arrange out patient f/u with CM  Signed: Charlton Hawseter Jernee Murtaugh 06/05/2022, 8:26 AM

## 2022-06-05 NOTE — ED Notes (Signed)
ED TO INPATIENT HANDOFF REPORT  ED Nurse Name and Phone #:  Theophilus Bones [phone]  S Name/Age/Gender Connor Baker 62 y.o. male Room/Bed: 030C/030C  Code Status   Code Status: Full Code  Home/SNF/Other Correction facility Patient oriented to: self, place, time, and situation Is this baseline? Yes   Triage Complete: Triage complete  Chief Complaint Chest pain [R07.9]  [facility] that began this afternoon. Pt was given Nitro and baby ASA.   Allergies No Known Allergies  Level of Care/Admitting Diagnosis ED Disposition     ED Disposition  Admit   Condition  --   Comment  Hospital Area: [facility] [[id number]]  Level of Care: Telemetry Cardiac [[id number]]  May place patient in observation at [facility] or [facility] if equivalent level of care is available:: No  Covid Evaluation: Asymptomatic - no recent exposure (last 10 days) testing not required  Diagnosis: Chest pain [744799]  Admitting Physician: Charlsie Quest [[id number]]  Attending Physician: Charlsie Quest [[id number]]          B Medical/Surgery History Past Medical History:  Diagnosis Date   Arthritis    "legs" (08/28/2016)   Chronic lower back pain    CVA (cerebral vascular accident) (HCC)    Depression    GERD (gastroesophageal reflux disease)    High cholesterol    Hypertension    MI (myocardial infarction) (HCC) 1992; 1994; 1995   MI (myocardial infarction) (HCC)    [phone]   On home oxygen therapy    "4L just at night w/my CPAP" (08/28/2016)   OSA (obstructive sleep apnea)    w/oxygen" (08/28/2016)   Stroke (HCC) 2005   "mild one; faded my memory" (08/28/2016)   Past Surgical History:  Procedure Laterality Date   ANKLE FRACTURE SURGERY Right 2016   CORONARY ANGIOPLASTY     CORONARY ANGIOPLASTY WITH STENT PLACEMENT  1992 - 2017   "I've got a total of 16 stents in me" (08/28/2016)   CORONARY BALLOON ANGIOPLASTY Right 08/28/2016    Procedure: Coronary Balloon Angioplasty;  Surgeon: Lyn Records, MD;  Location: [facility] INVASIVE CV LAB;  Service: Cardiovascular;  Laterality: Right;   FRACTURE SURGERY     LEFT HEART CATH AND CORONARY ANGIOGRAPHY N/A 08/28/2016   Procedure: Left Heart Cath and Coronary Angiography;  Surgeon: Lyn Records, MD;  Location: [facility] INVASIVE CV LAB;  Service: Cardiovascular;  Laterality: N/A;   LEFT HEART CATH AND CORONARY ANGIOGRAPHY N/A 10/24/2016   Procedure: LEFT HEART CATH AND CORONARY ANGIOGRAPHY;  Surgeon: Swaziland, Peter M, MD;  Location: [facility] INVASIVE CV LAB;  Service: Cardiovascular;  Laterality: N/A;   LEFT HEART CATH AND CORONARY ANGIOGRAPHY N/A 04/07/2022   Procedure: LEFT HEART CATH AND CORONARY ANGIOGRAPHY;  Surgeon: Runell Gess, MD;  Location: [facility] INVASIVE CV LAB;  Service: Cardiovascular;  Laterality: N/A;   TOTAL HIP ARTHROPLASTY Right 1990     A IV Location/Drains/Wounds Patient Lines/Drains/Airways Status     Active Line/Drains/Airways     Name Placement date Placement time Site Days   Peripheral IV 06/04/22 18 G 1" Anterior;Distal;Left;Upper Arm 06/04/22  1553  Arm  1            Intake/Output Last 24 hours No intake or output data in the 24 hours ending 06/05/22 3094  Labs/Imaging Results for orders placed or performed during the hospital encounter of 06/04/22 (from the past 48 hour(s))  CBG monitoring, ED  Status: Abnormal   Collection Time: 06/04/22  3:55 PM  Result Value Ref Range   Glucose-Capillary 128 (H) 70 - 99 mg/dL    Comment: Glucose reference range applies only to samples taken after fasting for at least 8 hours.  Basic metabolic panel     Status: Abnormal   Collection Time: 06/04/22  3:55 PM  Result Value Ref Range   Sodium 141 135 - 145 mmol/L   Potassium 4.8 3.5 - 5.1 mmol/L   Chloride 106 98 - 111 mmol/L   CO2 26 22 - 32 mmol/L   Glucose, Bld 129 (H) 70 - 99 mg/dL    Comment: Glucose reference range applies only to samples taken after fasting  for at least 8 hours.   BUN 10 8 - 23 mg/dL   Creatinine, Ser 1.66 0.61 - 1.24 mg/dL   Calcium 8.7 (L) 8.9 - 10.3 mg/dL   GFR, Estimated >06 >30 mL/min    Comment: (NOTE) Calculated using the CKD-EPI Creatinine Equation (2021)    Anion gap 9 5 - 15    Comment: Performed at [facility], [address], [place], [place] [address]  CBC     Status: None   Collection Time: 06/04/22  3:55 PM  Result Value Ref Range   WBC 7.0 4.0 - 10.5 K/uL   RBC 4.34 4.22 - 5.81 MIL/uL   Hemoglobin 14.7 13.0 - 17.0 g/dL   HCT 93.2 35.5 - 73.2 %   MCV 97.7 80.0 - 100.0 fL   MCH 33.9 26.0 - 34.0 pg   MCHC 34.7 30.0 - 36.0 g/dL   RDW 20.2 54.2 - 70.6 %   Platelets 185 150 - 400 K/uL   nRBC 0.0 0.0 - 0.2 %    Comment: Performed at [facility], [address] [place], [place] [address]  Troponin I (High Sensitivity)     Status: Abnormal   Collection Time: 06/04/22  3:55 PM  Result Value Ref Range   Troponin I (High Sensitivity) 19 (H) <18 ng/L    Comment: (NOTE) Elevated high sensitivity troponin I (hsTnI) values and significant  changes across serial measurements may suggest ACS but many other  chronic and acute conditions are known to elevate hsTnI results.  Refer to the "Links" section for chest pain algorithms and additional  guidance. Performed at [facility], [address] [place], [place] [address]   Troponin I (High Sensitivity)     Status: Abnormal   Collection Time: 06/04/22  6:21 PM  Result Value Ref Range   Troponin I (High Sensitivity) 22 (H) <18 ng/L    Comment: (NOTE) Elevated high sensitivity troponin I (hsTnI) values and significant  changes across serial measurements may suggest ACS but many other  chronic and acute conditions are known to elevate hsTnI results.  Refer to the "Links" section for chest pain algorithms and additional  guidance. Performed at [facility], [address], [place], [place] [address]   Troponin I (High Sensitivity)      Status: Abnormal   Collection Time: 06/04/22 11:58 PM  Result Value Ref Range   Troponin I (High Sensitivity) 20 (H) <18 ng/L    Comment: (NOTE) Elevated high sensitivity troponin I (hsTnI) values and significant  changes across serial measurements may suggest ACS but many other  chronic and acute conditions are known to elevate hsTnI results.  Refer to the "Links" section for chest pain algorithms and additional  guidance. Performed at [facility], [address], [place], [place] [address]   CBC     Status: Abnormal   Collection Time: 06/05/22  4:20 AM  Result Value Ref Range   WBC 8.9 4.0 - 10.5 K/uL   RBC 4.43 4.22 - 5.81 MIL/uL   Hemoglobin 15.2 13.0 - 17.0 g/dL   HCT 21.3 08.6 - 57.8 %   MCV 95.5 80.0 - 100.0 fL   MCH 34.3 (H) 26.0 - 34.0 pg   MCHC 35.9 30.0 - 36.0 g/dL   RDW 46.9 62.9 - 52.8 %   Platelets 176 150 - 400 K/uL   nRBC 0.0 0.0 - 0.2 %    Comment: Performed at [facility] Lab, [address], [place], [place] [address]  Basic metabolic panel     Status: Abnormal   Collection Time: 06/05/22  4:20 AM  Result Value Ref Range   Sodium 137 135 - 145 mmol/L   Potassium 4.3 3.5 - 5.1 mmol/L    Comment: HEMOLYSIS AT THIS LEVEL MAY AFFECT RESULT   Chloride 104 98 - 111 mmol/L   CO2 25 22 - 32 mmol/L   Glucose, Bld 98 70 - 99 mg/dL    Comment: Glucose reference range applies only to samples taken after fasting for at least 8 hours.   BUN 12 8 - 23 mg/dL   Creatinine, Ser 4.01 0.61 - 1.24 mg/dL   Calcium 8.7 (L) 8.9 - 10.3 mg/dL   GFR, Estimated >02 >72 mL/min    Comment: (NOTE) Calculated using the CKD-EPI Creatinine Equation (2021)    Anion gap 8 5 - 15    Comment: Performed at [facility] Lab, [address], [place], [place] [address]   DG Chest 2 View  Result Date: 06/04/2022 CLINICAL DATA:  Chest pain. EXAM: CHEST - 2 VIEW COMPARISON:  04/29/2022 FINDINGS: Lungs are adequately inflated without airspace consolidation or effusion.  Cardiomediastinal silhouette and remainder of the exam is unchanged. IMPRESSION: No active cardiopulmonary disease. Electronically Signed   By: Elberta Fortis M.D.   On: 06/04/2022 16:29    Pending Labs Unresulted Labs (From admission, onward)     Start     Ordered   06/06/22 0500  CBC  Tomorrow morning,   R        06/05/22 0618   06/06/22 0500  Basic metabolic panel  Tomorrow morning,   R        06/05/22 0618   06/05/22 0500  HIV Antibody (routine testing w rflx)  (HIV Antibody (Routine testing w reflex) panel)  Tomorrow morning,   R        06/04/22 2316            Vitals/Pain Today's Vitals   06/05/22 0500 06/05/22 0530 06/05/22 0600 06/05/22 0618  BP: 116/77 124/84 101/71   Pulse: (!) 50 (!) 50 (!) 48   Resp: (!) 8 11 (!) 9   Temp:    97.8 F (36.6 C)  TempSrc:    Oral  SpO2: 92% 95% 93%   PainSc:        Isolation Precautions No active isolations  Medications Medications  nitroGLYCERIN (NITROSTAT) SL tablet 0.4 mg (0.4 mg Sublingual Given 06/04/22 1702)  enoxaparin (LOVENOX) injection 40 mg (has no administration in time range)  acetaminophen (TYLENOL) tablet 650 mg (has no administration in time range)    Or  acetaminophen (TYLENOL) suppository 650 mg (has no administration in time range)  ondansetron (ZOFRAN) tablet 4 mg (has  no administration in time range)    Or  ondansetron (ZOFRAN) injection 4 mg (has no administration in time range)  aspirin EC tablet 81 mg (has no administration in time range)  atorvastatin (LIPITOR) tablet 80 mg (has no administration in time range)  clopidogrel (PLAVIX) tablet 75 mg (has no administration in time range)  levETIRAcetam (KEPPRA) tablet 500 mg (500 mg Oral Given 06/04/22 2358)  pantoprazole (PROTONIX) EC tablet 40 mg (has no administration in time range)  ranolazine (RANEXA) 12 hr tablet 500 mg (500 mg Oral Given 06/05/22 0002)  morphine (PF) 4 MG/ML injection 4 mg (4 mg Intravenous Given 06/04/22 1735)  morphine (PF) 4 MG/ML  injection 4 mg (4 mg Intravenous Given 06/04/22 2202)  morphine (PF) 2 MG/ML injection 1 mg (1 mg Intravenous Given 06/05/22 0419)    Mobility walks     Focused Assessments Cardiac Assessment Handoff:  Cardiac Rhythm: Normal sinus rhythm Lab Results  Component Value Date   TROPONINI <0.03 01/01/2018   No results found for: "DDIMER" Does the Patient currently have chest pain? No    R Recommendations: See Admitting Provider Note  Report given to:   Additional Notes:

## 2022-06-05 NOTE — Discharge Summary (Signed)
Physician Discharge Summary   Patient: Connor Baker MRN: [id number] DOB: 1960/07/09  Admit date:     06/04/2022  Discharge date: 06/05/22  Discharge Physician: Thad Ranger, MD    PCP: [facility]   Recommendations at discharge:    Outpatient follow-up with cardiology recommended. Consider adding imdur if BP allows.   Discharge Diagnoses:    Atypical Chest pain  History of coronary artery disease   OSA (obstructive sleep apnea) Hypertension Hyperlipidemia     Hospital Course: Patient is a 62 year old male with CAD status post prior PCI, CVA, HTN, HLD, seizures, OSA on CPAP [facility] of chest pain.  Patient reported central pressure-like chest pain beginning around 2 to 3 PM on 4/10, occurred after showering with associated nausea, vomiting, diaphoresis, nonradiating.  He was sent to ED for further evaluation.  Patient reports constant chest pain with some improvement after receiving morphine.   In ED, vital signs stable,  Labs show sodium 141, potassium 4.8, bicarb 26, BUN 10, creatinine 1.12, serum glucose 129, WBC 7.0, hemoglobin 14.7, platelets 185,000, troponin 19 > 22.  2 view chest x-ray negative for focal consolidation, edema, effusion.    Assessment and Plan:  Atypical Chest pain In the setting of history of extensive CAD -Presented with constant midsternal chest pressure, troponins minimally elevated,19-22-20 -EKG without acute ischemic changes.  Chest x-ray with no infiltrates. -Patient had previous left heart cath on 04/07/2022 which showed patent RCA stent, 70% stenosis ostial cx to prox cx, 50% stenosed mid LAD lesion, 70% stenosis RPAV lesion, 100% stenosis of first RPL lesion.  Patient was recommended medical management. -Complaining of chest pain, states just had received nitroglycerin with no significant effect, ordered morphine IV as needed -Seen by cardiology this morning, Dr. Eden Emms, recommended okay to DC after echo if showed  normal EF, no R WMA's and will follow outpatient -Continue Ranexa, aspirin, Lipitor, Plavix.  No beta-blocker due to low BP and bradycardia -Received message from cardiology, Dr Eden Emms that echo is fine with no new RWMA, and patient is cleared to be discharged from cardiology standpoint   Hyperlipidemia -Continue statin     OSA (obstructive sleep apnea) -Continue CPAP qhs   History of seizures Continue Keppra   Obesity Estimated body mass index is 31.63 kg/m as calculated from the following:   Height as of 04/29/22: 5\' 10"  (1.778 m).   Weight as of 04/29/22: 100 kg.        Pain control - [employer] Controlled Substance Reporting System database was reviewed. and patient was instructed, not to drive, operate heavy machinery, perform activities at heights, swimming or participation in water activities or provide baby-sitting services while on Pain, Sleep and Anxiety Medications; until their outpatient Physician has advised to do so again. Also recommended to not to take more than prescribed Pain, Sleep and Anxiety Medications.  Consultants: cardiology  Procedures performed: 2d ECHO   Disposition:  Diet recommendation:  Discharge Diet Orders (From admission, onward)     Start     Ordered   06/05/22 0000  Diet - low sodium heart healthy        06/05/22 1432            DISCHARGE MEDICATION: Allergies as of 06/05/2022   No Known Allergies      Medication List     TAKE these medications    acetaminophen 500 MG tablet Commonly known as: TYLENOL Take 500-1,000 mg by mouth every 6 (six) hours as  needed for mild pain (or headaches).   ascorbic acid 500 MG tablet Commonly known as: VITAMIN C Take 500 mg by mouth daily.   aspirin EC 81 MG tablet Take 1 tablet (81 mg total) by mouth daily. Swallow whole.   atorvastatin 80 MG tablet Commonly known as: LIPITOR Take 1 tablet (80 mg total) by mouth daily.   cholecalciferol 25 MCG (1000 UNIT) tablet Commonly known as:  VITAMIN D3 Take 1,000 Units by mouth daily.   clopidogrel 75 MG tablet Commonly known as: PLAVIX Take 1 tablet (75 mg total) by mouth daily.   levETIRAcetam 500 MG tablet Commonly known as: Keppra Take 1 tablet (500 mg total) by mouth 2 (two) times daily.   loratadine 10 MG tablet Commonly known as: CLARITIN Take 10 mg by mouth at bedtime.   naproxen sodium 220 MG tablet Commonly known as: ALEVE Take 220-440 mg by mouth 2 (two) times daily as needed (for mild pain or headaches).   nitroGLYCERIN 0.4 MG SL tablet Commonly known as: NITROSTAT Place 1 tablet (0.4 mg total) under the tongue every 5 (five) minutes x 3 doses as needed for chest pain.   omega-3 acid ethyl esters 1 g capsule Commonly known as: LOVAZA Take 1 capsule (1 g total) by mouth 2 (two) times daily.   pantoprazole 40 MG tablet Commonly known as: PROTONIX Take 1 tablet (40 mg total) by mouth daily.   ranolazine 500 MG 12 hr tablet Commonly known as: Ranexa Take 1 tablet (500 mg total) by mouth 2 (two) times daily.        Follow-up Information     [facility]. Schedule an appointment as soon as possible for a visit in 2 week(s).   Specialty: General Practice Why: for hospital follow-up Contact information: [address] [place] [address] [phone]         Kathleene Hazel, MD. Schedule an appointment as soon as possible for a visit in 2 week(s).   Specialty: Cardiology Why: for hospital follow-up Contact information: [address] [place] [address] [phone]                Discharge Exam: S: No acute issues, cleared from cardiology perspective for discharge   BP (!) 131/98   Pulse (!) 58   Temp 97.8 F (36.6 C) (Oral)   Resp 12   Ht 5\' 10"  (1.778 m)   SpO2 95%   BMI 31.63 kg/m   Physical Exam General: Alert and oriented x 3, NAD Cardiovascular: S1 S2 clear, RRR.  Respiratory: CTAB, no wheezing, rales or  rhonchi Gastrointestinal: Soft, nontender, nondistended, NBS Ext: no pedal edema bilaterally Neuro: no new deficits Psych: Normal affect    Condition at discharge: fair  The results of significant diagnostics from this hospitalization (including imaging, microbiology, ancillary and laboratory) are listed below for reference.   Imaging Studies: DG Chest 2 View  Result Date: 06/04/2022 CLINICAL DATA:  Chest pain. EXAM: CHEST - 2 VIEW COMPARISON:  04/29/2022 FINDINGS: Lungs are adequately inflated without airspace consolidation or effusion. Cardiomediastinal silhouette and remainder of the exam is unchanged. IMPRESSION: No active cardiopulmonary disease. Electronically Signed   By: Elberta Fortis M.D.   On: 06/04/2022 16:29    Microbiology: Results for orders placed or performed during the hospital encounter of 07/21/20  Resp Panel by RT-PCR (Flu A&B, Covid) Nasopharyngeal Swab     Status: None   Collection Time: 07/21/20  1:31 PM   Specimen: Nasopharyngeal Swab;  Nasopharyngeal(NP) swabs in vial transport medium  Result Value Ref Range Status   SARS Coronavirus 2 by RT PCR NEGATIVE NEGATIVE Final    Comment: (NOTE) SARS-CoV-2 target nucleic acids are NOT DETECTED.  The SARS-CoV-2 RNA is generally detectable in upper respiratory specimens during the acute phase of infection. The lowest concentration of SARS-CoV-2 viral copies this assay can detect is 138 copies/mL. A negative result does not preclude SARS-Cov-2 infection and should not be used as the sole basis for treatment or other patient management decisions. A negative result may occur with  improper specimen collection/handling, submission of specimen other than nasopharyngeal swab, presence of viral mutation(s) within the areas targeted by this assay, and inadequate number of viral copies(<138 copies/mL). A negative result must be combined with clinical observations, patient history, and epidemiological information. The expected  result is Negative.  Fact Sheet for Patients:  BloggerCourse.comhttps://www.fda.gov/media/152166/download  Fact Sheet for Healthcare Providers:  SeriousBroker.ithttps://www.fda.gov/media/152162/download  This test is no t yet approved or cleared by the [place] FDA and  has been authorized for detection and/or diagnosis of SARS-CoV-2 by FDA under an Emergency Use Authorization (EUA). This EUA will remain  in effect (meaning this test can be used) for the duration of the COVID-19 declaration under Section 564(b)(1) of the Act, 21 U.S.C.section 360bbb-3(b)(1), unless the authorization is terminated  or revoked sooner.       Influenza A by PCR NEGATIVE NEGATIVE Final   Influenza B by PCR NEGATIVE NEGATIVE Final    Comment: (NOTE) The Xpert Xpress SARS-CoV-2/FLU/RSV plus assay is intended as an aid in the diagnosis of influenza from Nasopharyngeal swab specimens and should not be used as a sole basis for treatment. Nasal washings and aspirates are unacceptable for Xpert Xpress SARS-CoV-2/FLU/RSV testing.  Fact Sheet for Patients: BloggerCourse.comhttps://www.fda.gov/media/152166/download  Fact Sheet for Healthcare Providers: SeriousBroker.ithttps://www.fda.gov/media/152162/download  This test is not yet approved or cleared by the [place] FDA and has been authorized for detection and/or diagnosis of SARS-CoV-2 by FDA under an Emergency Use Authorization (EUA). This EUA will remain in effect (meaning this test can be used) for the duration of the COVID-19 declaration under Section 564(b)(1) of the Act, 21 U.S.C. section 360bbb-3(b)(1), unless the authorization is terminated or revoked.  Performed at [facility], [address] [place], [place] [address]   MRSA PCR Screening     Status: None   Collection Time: 07/21/20 10:49 PM   Specimen: Nasal Mucosa; Nasopharyngeal  Result Value Ref Range Status   MRSA by PCR NEGATIVE NEGATIVE Final    Comment:        The GeneXpert MRSA Assay (FDA approved for NASAL  specimens only), is one component of a comprehensive MRSA colonization surveillance program. It is not intended to diagnose MRSA infection nor to guide or monitor treatment for MRSA infections. Performed at [facility], [address] [place], [place] [address]     Labs: CBC: Recent Labs  Lab 06/04/22 1555 06/05/22 0420  WBC 7.0 8.9  HGB 14.7 15.2  HCT 42.4 42.3  MCV 97.7 95.5  PLT 185 176   Basic Metabolic Panel: Recent Labs  Lab 06/04/22 1555 06/05/22 0420  NA 141 137  K 4.8 4.3  CL 106 104  CO2 26 25  GLUCOSE 129* 98  BUN 10 12  CREATININE 1.12 1.07  CALCIUM 8.7* 8.7*   Liver Function Tests: No results for input(s): "AST", "ALT", "ALKPHOS", "BILITOT", "PROT", "ALBUMIN" in the last 168 hours. CBG: Recent Labs  Lab  06/04/22 1555  GLUCAP 128*    Discharge time spent: greater than 30 minutes.  Signed: Thad Ranger, MD [facility] 06/05/2022

## 2022-06-19 ENCOUNTER — Emergency Department (HOSPITAL_COMMUNITY)
Admission: EM | Admit: 2022-06-19 | Discharge: 2022-06-19 | Disposition: A | Discharge status: Home / Self Care | Attending: Emergency Medicine | Admitting: Emergency Medicine

## 2022-06-19 ENCOUNTER — Emergency Department (HOSPITAL_COMMUNITY)

## 2022-06-19 ENCOUNTER — Other Ambulatory Visit: Payer: Self-pay

## 2022-06-19 ENCOUNTER — Encounter (HOSPITAL_COMMUNITY): Payer: Self-pay

## 2022-06-19 DIAGNOSIS — R0789 Other chest pain: Secondary | ICD-10-CM | POA: Insufficient documentation

## 2022-06-19 DIAGNOSIS — Z7902 Long term (current) use of antithrombotics/antiplatelets: Secondary | ICD-10-CM | POA: Insufficient documentation

## 2022-06-19 DIAGNOSIS — Z7982 Long term (current) use of aspirin: Secondary | ICD-10-CM | POA: Insufficient documentation

## 2022-06-19 DIAGNOSIS — R079 Chest pain, unspecified: Secondary | ICD-10-CM

## 2022-06-19 LAB — BASIC METABOLIC PANEL
Anion gap: 7 (ref 5–15)
BUN: 9 mg/dL (ref 8–23)
CO2: 22 mmol/L (ref 22–32)
Calcium: 6.9 mg/dL — ABNORMAL LOW (ref 8.9–10.3)
Chloride: 113 mmol/L — ABNORMAL HIGH (ref 98–111)
Creatinine, Ser: 0.67 mg/dL (ref 0.61–1.24)
GFR, Estimated: >60 mL/min (ref >60)
Glucose, Bld: 84 mg/dL (ref 70–99)
Potassium: 2.9 mmol/L — ABNORMAL LOW (ref 3.5–5.1)
Sodium: 142 mmol/L (ref 135–145)

## 2022-06-19 LAB — CBC
HCT: 42.1 % (ref 39.0–52.0)
Hemoglobin: 15.1 g/dL (ref 13.0–17.0)
MCH: 33.3 pg (ref 26.0–34.0)
MCHC: 35.9 g/dL (ref 30.0–36.0)
MCV: 92.9 fL (ref 80.0–100.0)
Platelets: 187 10*3/uL (ref 150–400)
RBC: 4.53 MIL/uL (ref 4.22–5.81)
RDW: 12 % (ref 11.5–15.5)
WBC: 9.6 10*3/uL (ref 4.0–10.5)
nRBC: 0 % (ref 0.0–0.2)

## 2022-06-19 LAB — HEPATIC FUNCTION PANEL
ALT: 36 U/L (ref 0–44)
AST: 22 U/L (ref 15–41)
Albumin: 3 g/dL — ABNORMAL LOW (ref 3.5–5.0)
Alkaline Phosphatase: 43 U/L (ref 38–126)
Bilirubin, Direct: <0.1 mg/dL (ref 0.0–0.2)
Total Bilirubin: 0.6 mg/dL (ref 0.3–1.2)
Total Protein: 4.9 g/dL — ABNORMAL LOW (ref 6.5–8.1)

## 2022-06-19 LAB — TROPONIN I (HIGH SENSITIVITY)
Troponin I (High Sensitivity): 5 ng/L (ref <18)
Troponin I (High Sensitivity): 6 ng/L (ref <18)

## 2022-06-19 LAB — CBG MONITORING, ED: Glucose-Capillary: 101 mg/dL — ABNORMAL HIGH (ref 70–99)

## 2022-06-19 LAB — LIPASE, BLOOD: Lipase: 24 U/L (ref 11–51)

## 2022-06-19 MED ORDER — ALUM & MAG HYDROXIDE-SIMETH 200-200-20 MG/5ML PO SUSP
30.0000 mL | Freq: Once | ORAL | Status: AC
  Administered 2022-06-19: 30 mL via ORAL
  Filled 2022-06-19: qty 30

## 2022-06-19 MED ORDER — MORPHINE SULFATE (PF) 4 MG/ML IV SOLN
4.0000 mg | Freq: Once | INTRAVENOUS | Status: AC
  Administered 2022-06-19: 4 mg via INTRAVENOUS
  Filled 2022-06-19: qty 1

## 2022-06-19 MED ORDER — POTASSIUM CHLORIDE CRYS ER 20 MEQ PO TBCR
40.0000 meq | EXTENDED_RELEASE_TABLET | Freq: Once | ORAL | Status: AC
  Administered 2022-06-19: 40 meq via ORAL
  Filled 2022-06-19: qty 2

## 2022-06-19 NOTE — ED Triage Notes (Signed)
Pt c/o chest pain radiating to arm. Given [facility] NTG by jail and 1 by EMS. Also given  ASA. 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

## 2022-06-19 NOTE — Discharge Instructions (Signed)
Your tests

## 2022-06-19 NOTE — ED Provider Notes (Signed)
[place] EMERGENCY DEPARTMENT AT [facility] Provider Note   CSN: [id number] Arrival date & time: 06/19/22  6962     History  Chief Complaint  Patient presents with   Chest Pain    Connor Baker is a 62 y.o. male.  62 yo M with a chief of chest pain.  This is felt like a pressure.  Occurred when he was getting up to walk to go to the bathroom about 45 minutes ago.  Had some difficulty breathing with it some nausea.  Has a history of an MI in the past and thinks this feels similar.  Thinks he felt fine yesterday.   Chest Pain      Home Medications Prior to Admission medications   Medication Sig Start Date End Date Taking? Authorizing Provider  aspirin EC 81 MG EC tablet Take 1 tablet (81 mg total) by mouth daily. Swallow whole. 05/16/20  Yes Georgie Chard D, NP  atorvastatin (LIPITOR) 80 MG tablet Take 1 tablet (80 mg total) by mouth daily. 05/16/20  Yes Georgie Chard D, NP  cholecalciferol (VITAMIN D3) 25 MCG (1000 UNIT) tablet Take 1,000 Units by mouth daily.   Yes [provider]  clopidogrel (PLAVIX) 75 MG tablet Take 1 tablet (75 mg total) by mouth daily. 05/15/20  Yes Georgie Chard D, NP  cyanocobalamin (VITAMIN B12) 1000 MCG tablet Take 1,000 mcg by mouth daily.   Yes [provider]  levETIRAcetam (KEPPRA) 500 MG tablet Take 1 tablet (500 mg total) by mouth 2 (two) times daily. 07/23/20  Yes Berna Bue, MD  naproxen sodium (ALEVE) 220 MG tablet Take 220-440 mg by mouth 2 (two) times daily as needed (for mild pain or headaches).   Yes [provider]  nitroGLYCERIN (NITROSTAT) 0.4 MG SL tablet Place 1 tablet (0.4 mg total) under the tongue every 5 (five) minutes x 3 doses as needed for chest pain. 01/02/18  Yes Georgie Chard D, NP  omega-3 acid ethyl esters (LOVAZA) 1 g capsule Take 1 capsule (1 g total) by mouth 2 (two) times daily. 04/08/22  Yes Rai, Ripudeep K, MD  pantoprazole (PROTONIX) 40 MG tablet Take 1 tablet (40 mg  total) by mouth daily. 04/09/22  Yes Rai, Ripudeep K, MD  ranolazine (RANEXA) 500 MG 12 hr tablet Take 1 tablet (500 mg total) by mouth 2 (two) times daily. 11/26/20  Yes Tilden Fossa, MD  acetaminophen (TYLENOL) 500 MG tablet Take 500-1,000 mg by mouth every 6 (six) hours as needed for mild pain (or headaches). Patient not taking: Reported on 06/19/2022    [provider]  ascorbic acid (VITAMIN C) 500 MG tablet Take 500 mg by mouth daily. Patient not taking: Reported on 06/19/2022    [provider]  loratadine (CLARITIN) 10 MG tablet Take 10 mg by mouth at bedtime. Patient not taking: Reported on 06/19/2022    [provider]      Allergies    Patient has no known allergies.    Review of Systems   Review of Systems  Cardiovascular:  Positive for chest pain.    Physical Exam Updated Vital Signs BP 120/87   Pulse (!) 57   Resp (!) 22   Ht  (1.778 m)   Wt 86.6 kg   SpO2 96%   BMI 27.41 kg/m  Physical Exam Vitals and nursing note reviewed.  Constitutional:      Appearance: He is well-developed.  HENT:     Head: Normocephalic and  atraumatic.  Eyes:     Pupils: Pupils are equal, round, and reactive to light.  Neck:     Vascular: No JVD.  Cardiovascular:     Rate and Rhythm: Normal rate and regular rhythm.     Heart sounds: No murmur heard.    No friction rub. No gallop.  Pulmonary:     Effort: No respiratory distress.     Breath sounds: No wheezing.  Abdominal:     General: There is no distension.     Tenderness: There is no abdominal tenderness. There is no guarding or rebound.  Musculoskeletal:        General: Normal range of motion.     Cervical back: Normal range of motion and neck supple.  Skin:    Coloration: Skin is not pale.     Findings: No rash.  Neurological:     Mental Status: He is alert and oriented to person, place, and time.  Psychiatric:        Behavior: Behavior normal.     ED Results / Procedures / Treatments    Labs (all labs ordered are listed, but only abnormal results are displayed) Labs Reviewed  BASIC METABOLIC PANEL - Abnormal; Notable for the following components:      Result Value   Potassium 2.9 (*)    Chloride 113 (*)    Calcium 6.9 (*)    All other components within normal limits  HEPATIC FUNCTION PANEL - Abnormal; Notable for the following components:   Total Protein 4.9 (*)    Albumin 3.0 (*)    All other components within normal limits  CBG MONITORING, ED - Abnormal; Notable for the following components:   Glucose-Capillary 101 (*)    All other components within normal limits  CBC  LIPASE, BLOOD  TROPONIN I (HIGH SENSITIVITY)  TROPONIN I (HIGH SENSITIVITY)    EKG EKG Interpretation  Date/Time:  Thursday June 19 2022 03:30:34 EDT Ventricular Rate:  67 PR Interval:  137 QRS Duration: 91 QT Interval:  401 QTC Calculation: 424 R Axis:   77 Text Interpretation: Sinus rhythm No significant change since last tracing Confirmed by Melene Plan [phone] on 06/19/2022 3:32:24 AM  Radiology DG Chest Port 1 View  Result Date: 06/19/2022 CLINICAL DATA:  Chest pain. EXAM: PORTABLE CHEST 1 VIEW COMPARISON:  June 04, 2022 FINDINGS: The heart size and mediastinal contours are within normal limits. Mild atelectatic changes are seen within the bilateral lung bases. There is no evidence of acute infiltrate, pleural effusion or pneumothorax. The visualized skeletal structures are unremarkable. IMPRESSION: No active disease. Electronically Signed   By: Aram Candela M.D.   On: 06/19/2022 03:42    Procedures Procedures    Medications Ordered in ED Medications  alum & mag hydroxide-simeth (MAALOX/MYLANTA) 200-200-20 MG/5ML suspension 30 mL (30 mLs Oral Given 06/19/22 0338)  morphine (PF) 4 MG/ML injection 4 mg (4 mg Intravenous Given 06/19/22 0338)  potassium chloride SA (KLOR-CON M) CR tablet 40 mEq (40 mEq Oral Given 06/19/22 0441)  morphine (PF) 4 MG/ML injection 4 mg (4 mg  Intravenous Given 06/19/22 0522)    ED Course/ Medical Decision Making/ A&P                             Medical Decision Making Amount and/or Complexity of Data Reviewed Labs: ordered. Radiology: ordered.  Risk OTC drugs. Prescription drug management.   62 yo M with a chief complaint of chest  pain.  Going on for about 45 minutes or so.  Feels like his prior MI per the patient.  Will obtain troponins chest x-ray blood work.  On record review the patient has been seen fairly frequently over the past couple months for chest discomfort.  Has been admitted twice and had a cardiac catheterization done in February of this year.  At that time there was thought to be no significant change to his coronary anatomy.  I suspect if 2 troponins are negative that this is unlikely to be an acute coronary syndrome and the patient likely could be [facility] back to [facility].  Delta negative.  D/c home.   6:59 AM:  I have discussed the diagnosis/risks/treatment options with the patient.  Evaluation and diagnostic testing in the emergency department does not suggest an emergent condition requiring admission or immediate intervention beyond what has been performed at this time.  They will follow up with PCP. We also discussed returning to the ED immediately if new or worsening sx occur. We discussed the sx which are most concerning (e.g., sudden worsening pain, fever, inability to tolerate by mouth) that necessitate immediate return. Medications administered to the patient during their visit and any new prescriptions provided to the patient are listed below.  Medications given during this visit Medications  alum & mag hydroxide-simeth (MAALOX/MYLANTA) 200-200-20 MG/5ML suspension 30 mL (30 mLs Oral Given 06/19/22 0338)  morphine (PF) 4 MG/ML injection 4 mg (4 mg Intravenous Given 06/19/22 0338)  potassium chloride SA (KLOR-CON M) CR tablet 40 mEq (40 mEq Oral Given 06/19/22 0441)  morphine (PF) 4 MG/ML injection 4  mg (4 mg Intravenous Given 06/19/22 0522)     The patient appears reasonably screen and/or stabilized for discharge and I doubt any other medical condition or other [facility] requiring further screening, evaluation, or treatment in the ED at this time prior to discharge.          Final Clinical Impression(s) / ED Diagnoses Final diagnoses:  Nonspecific chest pain    Rx / DC Orders ED Discharge Orders          Ordered    Ambulatory referral to Cardiology       Comments: If you have not heard from the Cardiology office within the next 72 hours please call [phone].   06/19/22 0601              Melene Plan, DO 06/19/22 [phone]

## 2022-07-02 ENCOUNTER — Emergency Department (HOSPITAL_COMMUNITY)

## 2022-07-02 ENCOUNTER — Encounter (HOSPITAL_COMMUNITY): Payer: Self-pay | Admitting: Emergency Medicine

## 2022-07-02 ENCOUNTER — Emergency Department (HOSPITAL_COMMUNITY)
Admission: EM | Admit: 2022-07-02 | Discharge: 2022-07-02 | Disposition: A | Discharge status: Home / Self Care | Attending: Emergency Medicine | Admitting: Emergency Medicine

## 2022-07-02 ENCOUNTER — Other Ambulatory Visit: Payer: Self-pay

## 2022-07-02 DIAGNOSIS — R0602 Shortness of breath: Secondary | ICD-10-CM | POA: Insufficient documentation

## 2022-07-02 DIAGNOSIS — R930 Abnormal findings on diagnostic imaging of skull and head, not elsewhere classified: Secondary | ICD-10-CM | POA: Diagnosis not present

## 2022-07-02 DIAGNOSIS — I10 Essential (primary) hypertension: Secondary | ICD-10-CM | POA: Diagnosis not present

## 2022-07-02 DIAGNOSIS — I2510 Atherosclerotic heart disease of native coronary artery without angina pectoris: Secondary | ICD-10-CM | POA: Diagnosis not present

## 2022-07-02 DIAGNOSIS — R072 Precordial pain: Secondary | ICD-10-CM | POA: Insufficient documentation

## 2022-07-02 DIAGNOSIS — H538 Other visual disturbances: Secondary | ICD-10-CM | POA: Diagnosis not present

## 2022-07-02 DIAGNOSIS — Z7902 Long term (current) use of antithrombotics/antiplatelets: Secondary | ICD-10-CM | POA: Diagnosis not present

## 2022-07-02 DIAGNOSIS — R0789 Other chest pain: Secondary | ICD-10-CM | POA: Diagnosis not present

## 2022-07-02 DIAGNOSIS — Z7982 Long term (current) use of aspirin: Secondary | ICD-10-CM | POA: Insufficient documentation

## 2022-07-02 DIAGNOSIS — R079 Chest pain, unspecified: Secondary | ICD-10-CM

## 2022-07-02 DIAGNOSIS — R42 Dizziness and giddiness: Secondary | ICD-10-CM | POA: Diagnosis not present

## 2022-07-02 LAB — CBC
HCT: 43.2 % (ref 39.0–52.0)
Hemoglobin: 15.5 g/dL (ref 13.0–17.0)
MCH: 33.5 pg (ref 26.0–34.0)
MCHC: 35.9 g/dL (ref 30.0–36.0)
MCV: 93.5 fL (ref 80.0–100.0)
Platelets: 173 10*3/uL (ref 150–400)
RBC: 4.62 MIL/uL (ref 4.22–5.81)
RDW: 12.3 % (ref 11.5–15.5)
WBC: 7.2 10*3/uL (ref 4.0–10.5)
nRBC: 0 % (ref 0.0–0.2)

## 2022-07-02 LAB — BASIC METABOLIC PANEL
Anion gap: 7 (ref 5–15)
BUN: 17 mg/dL (ref 8–23)
CO2: 24 mmol/L (ref 22–32)
Calcium: 8.8 mg/dL — ABNORMAL LOW (ref 8.9–10.3)
Chloride: 106 mmol/L (ref 98–111)
Creatinine, Ser: 0.89 mg/dL (ref 0.61–1.24)
GFR, Estimated: >60 mL/min (ref >60)
Glucose, Bld: 118 mg/dL — ABNORMAL HIGH (ref 70–99)
Potassium: 3.8 mmol/L (ref 3.5–5.1)
Sodium: 137 mmol/L (ref 135–145)

## 2022-07-02 LAB — TROPONIN I (HIGH SENSITIVITY)
Troponin I (High Sensitivity): 6 ng/L (ref <18)
Troponin I (High Sensitivity): 5 ng/L (ref <18)

## 2022-07-02 MED ORDER — KETOROLAC TROMETHAMINE 30 MG/ML IJ SOLN
30.0000 mg | Freq: Once | INTRAMUSCULAR | Status: AC
  Administered 2022-07-02: 30 mg via INTRAVENOUS
  Filled 2022-07-02: qty 1

## 2022-07-02 NOTE — Discharge Instructions (Signed)
Evaluation for your chest pain today was overall reassuring.  Your EKG, cardiac enzymes, x-ray were all within normal limits.  I do recommend you follow-up with your cardiologist for your chest pain but at this time feel that you are safe to be discharged.  If you have worsening shortness of breath, chest pain or any other concerning symptom please return emergency department further evaluation.

## 2022-07-02 NOTE — ED Triage Notes (Signed)
Pt c/o center chest pressure since yesterday. Pt has had 324mg  asa and 1 nitro with no relief.

## 2022-07-02 NOTE — ED Provider Notes (Signed)
[place] EMERGENCY DEPARTMENT AT [facility] Provider Note   CSN: [id number] Arrival date & time: 07/02/22  1729     History  Chief Complaint  Patient presents with   Chest Pain   HPI Connor Baker is a 62 y.o. male with CAD status post multiple stents presenting for chest pain.  Started yesterday afternoon while walking.  Chest pain is midsternal and feels like pressure.  It does get worse with exertion. It is nonradiating. Endorse associated shortness of breath. Last night took aspirin and 3 sublingual nitros with no relief.  Patient also mentioned that he "passed out" last night while walking from his bed to the toilet and denies any preceding symptoms.  He woke up on the floor of his jail cell.  Denies cough congestion fever.   Chest Pain      Home Medications Prior to Admission medications   Medication Sig Start Date End Date Taking? Authorizing Provider  ascorbic acid (VITAMIN C) 500 MG tablet Take 500 mg by mouth daily.   Yes [provider]  aspirin EC 325 MG tablet Take 325 mg by mouth daily.   Yes [provider]  atorvastatin (LIPITOR) 80 MG tablet Take 1 tablet (80 mg total) by mouth daily. 05/16/20  Yes Georgie Chard D, NP  cholecalciferol (VITAMIN D3) 25 MCG (1000 UNIT) tablet Take 1,000 Units by mouth daily.   Yes [provider]  clopidogrel (PLAVIX) 75 MG tablet Take 1 tablet (75 mg total) by mouth daily. 05/15/20  Yes Georgie Chard D, NP  cyanocobalamin (VITAMIN B12) 1000 MCG tablet Take 1,000 mcg by mouth daily.   Yes [provider]  levETIRAcetam (KEPPRA) 500 MG tablet Take 1 tablet (500 mg total) by mouth 2 (two) times daily. 07/23/20  Yes Berna Bue, MD  naproxen (NAPROSYN) 500 MG tablet Take 500 mg by mouth 2 (two) times daily. 06/24/22  Yes [provider]  nitroGLYCERIN (NITROSTAT) 0.4 MG SL tablet Place 1 tablet (0.4 mg total) under the tongue every 5 (five) minutes x 3 doses as needed for chest  pain. 01/02/18  Yes Georgie Chard D, NP  Omega-3 Fatty Acids (FISH OIL) 1000 MG CAPS Take by mouth. 06/24/22  Yes [provider]  pantoprazole (PROTONIX) 40 MG tablet Take 1 tablet (40 mg total) by mouth daily. 04/09/22  Yes Rai, Ripudeep K, MD  ranolazine (RANEXA) 500 MG 12 hr tablet Take 1 tablet (500 mg total) by mouth 2 (two) times daily. 11/26/20  Yes Tilden Fossa, MD  aspirin EC 81 MG EC tablet Take 1 tablet (81 mg total) by mouth daily. Swallow whole. 05/16/20   Filbert Schilder, NP  omega-3 acid ethyl esters (LOVAZA) 1 g capsule Take 1 capsule (1 g total) by mouth 2 (two) times daily. Patient not taking: Reported on 07/02/2022 04/08/22   Cathren Harsh, MD      Allergies    Patient has no known allergies.    Review of Systems   Review of Systems  Cardiovascular:  Positive for chest pain.    Physical Exam   Vitals:   07/02/22 2100 07/02/22 2200  BP: 121/85 (!) 121/90  Pulse: (!) 56 (!) 56  Resp: 15 16  Temp:    SpO2: 95% 96%    CONSTITUTIONAL:  well-appearing, NAD NEURO: GCS 15. Speech is goal oriented. No deficits appreciated to CN III-XII; symmetric eyebrow raise, no facial drooping, tongue midline. Patient has equal grip strength bilaterally with 5/5 strength against  resistance in all major muscle groups bilaterally. Sensation to light touch intact. Patient moves extremities without ataxia. Normal finger-nose-finger. Patient ambulatory with steady gait. EYES:  eyes equal and reactive ENT/NECK:  Supple, no stridor, midline tenderness of cervical spine. Head and neck appear atraumatic CARDIO:  regular rate and rhythm, appears well-perfused  PULM:  No respiratory distress, CTAB GI/GU:  non-distended, soft MSK/SPINE:  No gross deformities, no edema, moves all extremities SKIN:  pale, no rash, atraumatic  *Additional and/or pertinent findings included in MDM below  ED Results / Procedures / Treatments   Labs (all labs ordered are listed, but only abnormal results  are displayed) Labs Reviewed  BASIC METABOLIC PANEL - Abnormal; Notable for the following components:      Result Value   Glucose, Bld 118 (*)    Calcium 8.8 (*)    All other components within normal limits  CBC  TROPONIN I (HIGH SENSITIVITY)  TROPONIN I (HIGH SENSITIVITY)    EKG EKG Interpretation  Date/Time:  Wednesday Jul 02 2022 17:48:58 EDT Ventricular Rate:  59 PR Interval:  150 QRS Duration: 92 QT Interval:  436 QTC Calculation: 432 R Axis:   49 Text Interpretation: Sinus rhythm Abnormal R-wave progression, early transition No significant change since last tracing Confirmed by Jacalyn Lefevre [phone]) on 07/02/2022 6:40:11 PM  Radiology CT Head Wo Contrast  Result Date: 07/02/2022 CLINICAL DATA:  Blunt polytrauma.  Central chest pressure EXAM: CT HEAD WITHOUT CONTRAST CT CERVICAL SPINE WITHOUT CONTRAST TECHNIQUE: Multidetector CT imaging of the head and cervical spine was performed following the standard protocol without intravenous contrast. Multiplanar CT image reconstructions of the cervical spine were also generated. RADIATION DOSE REDUCTION: This exam was performed according to the departmental dose-optimization program which includes automated exposure control, adjustment of the mA and/or kV according to patient size and/or use of iterative reconstruction technique. COMPARISON:  CT head 07/22/2020 and CT cervical spine 07/21/2020 FINDINGS: CT HEAD FINDINGS Brain: No intracranial hemorrhage, mass effect, or evidence of acute infarct. No hydrocephalus. No extra-axial fluid collection. Vascular: No hyperdense vessel or unexpected calcification. Skull: No fracture or focal lesion. Sinuses/Orbits: No acute finding. Paranasal sinuses and mastoid air cells are well aerated. Other: None. CT CERVICAL SPINE FINDINGS Alignment: No evidence of traumatic malalignment. Skull base and vertebrae: No acute fracture. No primary bone lesion or focal pathologic process. Soft tissues and spinal canal:  No prevertebral fluid or swelling. No visible canal hematoma. Disc levels: Mild multilevel spondylosis. Mild disc space height loss at C5-C6. Posterior disc osteophyte complex at C5-C6 causes mild effacement of the ventral thecal sac. No high-grade spinal canal narrowing. Uncovertebral spurring and facet arthropathy cause mild-moderate neural foraminal narrowing on the right at C5-C6. Upper chest: No acute abnormality. Other: None. IMPRESSION: 1. No acute intracranial abnormality. 2. No acute fracture in the cervical spine. Electronically Signed   By: Minerva Fester M.D.   On: 07/02/2022 19:03   CT Cervical Spine Wo Contrast  Result Date: 07/02/2022 CLINICAL DATA:  Blunt polytrauma.  Central chest pressure EXAM: CT HEAD WITHOUT CONTRAST CT CERVICAL SPINE WITHOUT CONTRAST TECHNIQUE: Multidetector CT imaging of the head and cervical spine was performed following the standard protocol without intravenous contrast. Multiplanar CT image reconstructions of the cervical spine were also generated. RADIATION DOSE REDUCTION: This exam was performed according to the departmental dose-optimization program which includes automated exposure control, adjustment of the mA and/or kV according to patient size and/or use of iterative reconstruction technique. COMPARISON:  CT head 07/22/2020 and CT  cervical spine 07/21/2020 FINDINGS: CT HEAD FINDINGS Brain: No intracranial hemorrhage, mass effect, or evidence of acute infarct. No hydrocephalus. No extra-axial fluid collection. Vascular: No hyperdense vessel or unexpected calcification. Skull: No fracture or focal lesion. Sinuses/Orbits: No acute finding. Paranasal sinuses and mastoid air cells are well aerated. Other: None. CT CERVICAL SPINE FINDINGS Alignment: No evidence of traumatic malalignment. Skull base and vertebrae: No acute fracture. No primary bone lesion or focal pathologic process. Soft tissues and spinal canal: No prevertebral fluid or swelling. No visible canal  hematoma. Disc levels: Mild multilevel spondylosis. Mild disc space height loss at C5-C6. Posterior disc osteophyte complex at C5-C6 causes mild effacement of the ventral thecal sac. No high-grade spinal canal narrowing. Uncovertebral spurring and facet arthropathy cause mild-moderate neural foraminal narrowing on the right at C5-C6. Upper chest: No acute abnormality. Other: None. IMPRESSION: 1. No acute intracranial abnormality. 2. No acute fracture in the cervical spine. Electronically Signed   By: Minerva Fester M.D.   On: 07/02/2022 19:03   DG Chest Port 1 View  Result Date: 07/02/2022 CLINICAL DATA:  Chest pain EXAM: PORTABLE CHEST 1 VIEW COMPARISON:  06/19/2022 FINDINGS: The lungs are clear without focal pneumonia, edema, pneumothorax or pleural effusion. Interstitial markings are diffusely coarsened with chronic features. The cardiopericardial silhouette is within normal limits for size. No acute bony abnormality. Telemetry leads overlie the chest. IMPRESSION: Chronic interstitial coarsening without acute cardiopulmonary findings. Electronically Signed   By: Kennith Center M.D.   On: 07/02/2022 18:35    Procedures Procedures    Medications Ordered in ED Medications  ketorolac (TORADOL) 30 MG/ML injection 30 mg (30 mg Intravenous Given 07/02/22 2014)    ED Course/ Medical Decision Making/ A&P                             Medical Decision Making Amount and/or Complexity of Data Reviewed Labs: ordered. Radiology: ordered.  Risk Prescription drug management.   Initial Impression and Ddx 62 year old well-appearing male presenting for chest pain.  Exam is unremarkable.  DDx includes ACS, PE, pneumonia, CHF exacerbation, COPD, and aortic dissection. Patient PMH that increases complexity of ED encounter:  CAD status post multiple stents  Interpretation of Diagnostics -I independent reviewed and interpreted the labs as followed: Mild hyperglycemia  - I independently visualized the  following imaging with scope of interpretation limited to determining acute life threatening conditions related to emergency care: No acute findings on CT head and cervical spine.  Also x-ray without acute findings of the chest.  -Personally reviewed and interpreted EKG which revealed normal sinus rhythm.  -Echo in April of this year revealed LV ejection fraction 55 to 60%.  -Recent cath in February of this year revealed that there have been no major changes since his last cath in 2018 still showing significant coronary stenosis.  Patient Reassessment and Ultimate Disposition/Management Overall workup reassuring.  After reviewing his recent cath and echo along with evaluation for ACS here have low suspicion that his symptoms are related to his heart.  Considered PE but unlikely given normal troponins, not tachycardic, not hypoxic.  After several hours of observation, patient did states that his chest pain had improved.  Advised to follow-up with his cardiologist.  Discharged in stable vitals.  Discussed return precautions.  Patient management required discussion with the following services or consulting groups:  None  Complexity of Problems Addressed Acute complicated illness or Injury  Additional Data Reviewed and Analyzed Further  history obtained from: Past medical history and medications listed in the EMR, Prior ED visit notes, Recent Consult notes, and Prior labs/imaging results  Patient Encounter Risk Assessment None         Final Clinical Impression(s) / ED Diagnoses Final diagnoses:  Chest pain, unspecified type    Rx / DC Orders ED Discharge Orders     None         Gareth Eagle, PA-C 07/02/22 2243    Jacalyn Lefevre, MD 07/03/22 2239

## 2022-08-06 ENCOUNTER — Other Ambulatory Visit: Payer: Self-pay

## 2022-08-06 ENCOUNTER — Encounter (HOSPITAL_COMMUNITY): Payer: Self-pay

## 2022-08-06 ENCOUNTER — Emergency Department (HOSPITAL_COMMUNITY)
Admission: EM | Admit: 2022-08-06 | Discharge: 2022-08-06 | Disposition: A | Discharge status: Home / Self Care | Payer: 59 | Attending: Emergency Medicine | Admitting: Emergency Medicine

## 2022-08-06 DIAGNOSIS — L550 Sunburn of first degree: Secondary | ICD-10-CM | POA: Diagnosis not present

## 2022-08-06 DIAGNOSIS — M79601 Pain in right arm: Secondary | ICD-10-CM | POA: Diagnosis not present

## 2022-08-06 DIAGNOSIS — Z7982 Long term (current) use of aspirin: Secondary | ICD-10-CM | POA: Insufficient documentation

## 2022-08-06 DIAGNOSIS — M79602 Pain in left arm: Secondary | ICD-10-CM | POA: Insufficient documentation

## 2022-08-06 DIAGNOSIS — Z7902 Long term (current) use of antithrombotics/antiplatelets: Secondary | ICD-10-CM | POA: Insufficient documentation

## 2022-08-06 DIAGNOSIS — L551 Sunburn of second degree: Secondary | ICD-10-CM | POA: Insufficient documentation

## 2022-08-06 DIAGNOSIS — L559 Sunburn, unspecified: Secondary | ICD-10-CM | POA: Diagnosis not present

## 2022-08-06 MED ORDER — SILVER SULFADIAZINE 1 % EX CREA: 1.0000 | TOPICAL_CREAM | Freq: Every day | CUTANEOUS | 0 refills | Status: DC

## 2022-08-06 MED ORDER — IBUPROFEN 800 MG PO TABS
800.0000 mg | ORAL_TABLET | Freq: Once | ORAL | Status: AC
  Administered 2022-08-06: 800 mg via ORAL
  Filled 2022-08-06: qty 1

## 2022-08-06 MED ORDER — SILVER SULFADIAZINE 1 % EX CREA
TOPICAL_CREAM | Freq: Once | CUTANEOUS | Status: AC
  Administered 2022-08-06: 1 via TOPICAL
  Filled 2022-08-06: qty 50

## 2022-08-06 NOTE — ED Triage Notes (Signed)
Pt reports he was seen at [facility] for sunburn yesterday and they put lotion on his arms and wrapped them and told him to return for new wrapping today.  PT reports his arms are painful but he has not taken any tylenol or ibuprofen.
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
## 2022-08-30 ENCOUNTER — Emergency Department (HOSPITAL_COMMUNITY): Payer: 59

## 2022-08-30 ENCOUNTER — Other Ambulatory Visit: Payer: Self-pay

## 2022-08-30 ENCOUNTER — Encounter (HOSPITAL_COMMUNITY): Payer: Self-pay | Admitting: *Deleted

## 2022-08-30 ENCOUNTER — Observation Stay (HOSPITAL_COMMUNITY)
Admission: EM | Admit: 2022-08-30 | Discharge: 2022-09-02 | Disposition: A | Discharge status: Home / Self Care | Payer: 59 | Attending: Internal Medicine | Admitting: Internal Medicine

## 2022-08-30 DIAGNOSIS — F17210 Nicotine dependence, cigarettes, uncomplicated: Secondary | ICD-10-CM | POA: Diagnosis not present

## 2022-08-30 DIAGNOSIS — E876 Hypokalemia: Secondary | ICD-10-CM | POA: Diagnosis not present

## 2022-08-30 DIAGNOSIS — Z955 Presence of coronary angioplasty implant and graft: Secondary | ICD-10-CM | POA: Insufficient documentation

## 2022-08-30 DIAGNOSIS — I6523 Occlusion and stenosis of bilateral carotid arteries: Secondary | ICD-10-CM | POA: Diagnosis not present

## 2022-08-30 DIAGNOSIS — S06360A Traumatic hemorrhage of cerebrum, unspecified, without loss of consciousness, initial encounter: Secondary | ICD-10-CM | POA: Diagnosis not present

## 2022-08-30 DIAGNOSIS — R4182 Altered mental status, unspecified: Secondary | ICD-10-CM | POA: Diagnosis not present

## 2022-08-30 DIAGNOSIS — G9340 Encephalopathy, unspecified: Secondary | ICD-10-CM | POA: Diagnosis not present

## 2022-08-30 DIAGNOSIS — M47816 Spondylosis without myelopathy or radiculopathy, lumbar region: Secondary | ICD-10-CM | POA: Diagnosis not present

## 2022-08-30 DIAGNOSIS — I10 Essential (primary) hypertension: Secondary | ICD-10-CM | POA: Diagnosis not present

## 2022-08-30 DIAGNOSIS — R519 Headache, unspecified: Secondary | ICD-10-CM | POA: Diagnosis not present

## 2022-08-30 DIAGNOSIS — I619 Nontraumatic intracerebral hemorrhage, unspecified: Secondary | ICD-10-CM | POA: Diagnosis present

## 2022-08-30 DIAGNOSIS — I618 Other nontraumatic intracerebral hemorrhage: Principal | ICD-10-CM | POA: Insufficient documentation

## 2022-08-30 DIAGNOSIS — Z7902 Long term (current) use of antithrombotics/antiplatelets: Secondary | ICD-10-CM | POA: Diagnosis not present

## 2022-08-30 DIAGNOSIS — I2510 Atherosclerotic heart disease of native coronary artery without angina pectoris: Secondary | ICD-10-CM | POA: Diagnosis not present

## 2022-08-30 DIAGNOSIS — Z79899 Other long term (current) drug therapy: Secondary | ICD-10-CM | POA: Diagnosis not present

## 2022-08-30 DIAGNOSIS — M5136 Other intervertebral disc degeneration, lumbar region: Secondary | ICD-10-CM | POA: Diagnosis not present

## 2022-08-30 DIAGNOSIS — Z7982 Long term (current) use of aspirin: Secondary | ICD-10-CM | POA: Diagnosis not present

## 2022-08-30 DIAGNOSIS — Z96641 Presence of right artificial hip joint: Secondary | ICD-10-CM | POA: Insufficient documentation

## 2022-08-30 DIAGNOSIS — I700 Atherosclerosis of aorta: Secondary | ICD-10-CM | POA: Diagnosis not present

## 2022-08-30 DIAGNOSIS — Z8673 Personal history of transient ischemic attack (TIA), and cerebral infarction without residual deficits: Secondary | ICD-10-CM | POA: Diagnosis not present

## 2022-08-30 DIAGNOSIS — G40909 Epilepsy, unspecified, not intractable, without status epilepticus: Secondary | ICD-10-CM

## 2022-08-30 DIAGNOSIS — S3992XA Unspecified injury of lower back, initial encounter: Secondary | ICD-10-CM | POA: Diagnosis not present

## 2022-08-30 HISTORY — DX: Essential (primary) hypertension: I10

## 2022-08-30 LAB — BLOOD GAS, VENOUS
Acid-Base Excess: 7.3 mmol/L — ABNORMAL HIGH (ref 0.0–2.0)
Bicarbonate: 32 mmol/L — ABNORMAL HIGH (ref 20.0–28.0)
Drawn by: 65579
O2 Saturation: 58.7 %
Patient temperature: 37.4
pCO2, Ven: 45 mmHg (ref 44–60)
pH, Ven: 7.46 — ABNORMAL HIGH (ref 7.25–7.43)
pO2, Ven: 35 mmHg (ref 32–45)

## 2022-08-30 LAB — CBC WITH DIFFERENTIAL/PLATELET
Abs Immature Granulocytes: 0.04 10*3/uL (ref 0.00–0.07)
Basophils Absolute: 0 10*3/uL (ref 0.0–0.1)
Basophils Relative: 0 %
Eosinophils Absolute: 0.1 10*3/uL (ref 0.0–0.5)
Eosinophils Relative: 1 %
HCT: 43.9 % (ref 39.0–52.0)
Hemoglobin: 15.2 g/dL (ref 13.0–17.0)
Immature Granulocytes: 0 %
Lymphocytes Relative: 27 %
Lymphs Abs: 2.4 10*3/uL (ref 0.7–4.0)
MCH: 34.4 pg — ABNORMAL HIGH (ref 26.0–34.0)
MCHC: 34.6 g/dL (ref 30.0–36.0)
MCV: 99.3 fL (ref 80.0–100.0)
Monocytes Absolute: 1.3 10*3/uL — ABNORMAL HIGH (ref 0.1–1.0)
Monocytes Relative: 14 %
Neutro Abs: 5.2 10*3/uL (ref 1.7–7.7)
Neutrophils Relative %: 58 %
Platelets: 222 10*3/uL (ref 150–400)
RBC: 4.42 MIL/uL (ref 4.22–5.81)
RDW: 14.2 % (ref 11.5–15.5)
WBC: 9 10*3/uL (ref 4.0–10.5)
nRBC: 0 % (ref 0.0–0.2)

## 2022-08-30 LAB — RAPID URINE DRUG SCREEN, HOSP PERFORMED
Amphetamines: NOT DETECTED
Barbiturates: NOT DETECTED
Benzodiazepines: NOT DETECTED
Cocaine: NOT DETECTED
Opiates: NOT DETECTED
Tetrahydrocannabinol: NOT DETECTED

## 2022-08-30 LAB — COMPREHENSIVE METABOLIC PANEL
ALT: 23 U/L (ref 0–44)
AST: 20 U/L (ref 15–41)
Albumin: 3.5 g/dL (ref 3.5–5.0)
Alkaline Phosphatase: 71 U/L (ref 38–126)
Anion gap: 10 (ref 5–15)
BUN: 16 mg/dL (ref 8–23)
CO2: 26 mmol/L (ref 22–32)
Calcium: 9.3 mg/dL (ref 8.9–10.3)
Chloride: 101 mmol/L (ref 98–111)
Creatinine, Ser: 0.94 mg/dL (ref 0.61–1.24)
GFR, Estimated: >60 mL/min (ref >60)
Glucose, Bld: 142 mg/dL — ABNORMAL HIGH (ref 70–99)
Potassium: 2.9 mmol/L — ABNORMAL LOW (ref 3.5–5.1)
Sodium: 137 mmol/L (ref 135–145)
Total Bilirubin: 0.7 mg/dL (ref 0.3–1.2)
Total Protein: 6.9 g/dL (ref 6.5–8.1)

## 2022-08-30 LAB — URINALYSIS, ROUTINE W REFLEX MICROSCOPIC
Bacteria, UA: NONE SEEN
Bilirubin Urine: NEGATIVE
Glucose, UA: 50 mg/dL — AB
Hgb urine dipstick: NEGATIVE
Ketones, ur: NEGATIVE mg/dL
Leukocytes,Ua: NEGATIVE
Nitrite: NEGATIVE
Protein, ur: 30 mg/dL — AB
Specific Gravity, Urine: 1.027 (ref 1.005–1.030)
pH: 6 (ref 5.0–8.0)

## 2022-08-30 LAB — TROPONIN I (HIGH SENSITIVITY)
Troponin I (High Sensitivity): 11 ng/L (ref <18)
Troponin I (High Sensitivity): 13 ng/L (ref <18)

## 2022-08-30 LAB — APTT: aPTT: 26 seconds (ref 24–36)

## 2022-08-30 LAB — ETHANOL: Alcohol, Ethyl (B): <10 mg/dL (ref <10)

## 2022-08-30 LAB — PROTIME-INR
INR: 0.9 (ref 0.8–1.2)
Prothrombin Time: 12.5 seconds (ref 11.4–15.2)

## 2022-08-30 LAB — SALICYLATE LEVEL: Salicylate Lvl: <7 mg/dL — ABNORMAL LOW (ref 7.0–30.0)

## 2022-08-30 LAB — ACETAMINOPHEN LEVEL: Acetaminophen (Tylenol), Serum: <10 ug/mL — ABNORMAL LOW (ref 10–30)

## 2022-08-30 MED ORDER — LEVETIRACETAM IN NACL 1000 MG/100ML IV SOLN
1000.0000 mg | Freq: Once | INTRAVENOUS | Status: AC
  Administered 2022-08-30: 1000 mg via INTRAVENOUS
  Filled 2022-08-30: qty 100

## 2022-08-30 MED ORDER — SODIUM CHLORIDE 0.9 % IV SOLN: Freq: Once | INTRAVENOUS | Status: AC

## 2022-08-30 MED ORDER — IOHEXOL 350 MG/ML SOLN
75.0000 mL | Freq: Once | INTRAVENOUS | Status: AC | PRN
  Administered 2022-08-30: 75 mL via INTRAVENOUS

## 2022-08-30 MED ORDER — POTASSIUM CHLORIDE 10 MEQ/100ML IV SOLN
10.0000 meq | Freq: Once | INTRAVENOUS | Status: AC
  Administered 2022-08-30: 10 meq via INTRAVENOUS
  Filled 2022-08-30: qty 100

## 2022-08-30 NOTE — ED Provider Notes (Signed)
Received patient in turnover from Dr. Criss Alvine.  Please see their note for further details of Hx, PE.  Briefly patient is a 62 y.o. male with a Altered Mental Status .  Patient seen initially at [facility].  Sent here for neuro eval.  Neuro has seen patient, age in determinant ICH.  Dr. Derry Lory, recommends medical admission.     Melene Plan, DO 08/31/22 [phone]

## 2022-08-30 NOTE — Plan of Care (Signed)
ICH of unclear etiology, disoriented and slow per ED without focal deficits, unclear etiology at this time given atypical location, patient normotensive arguing against hypertensive ICH but no clear signs of trauma on ED exam.  - CTA head and neck and CTV to evaluate for underlying vascular malformation or CVST, negative on my prelim read but radiology read pending - PT/APTT INR to eval for coagulopathy (resulted normal)  - No antiplatelets due to ICH - DVT PPx heparin at 24 hrs if stable, SCDs for now - Blood pressure goal SBP < 140 (meeting this goal spontaneously, IV labetalol if HR > 60 or hydralazine if HR < 60 as needed to continue to meet goal)  - Cardiac monitoring  - PT consult, OT consult, Speech consult when patient stabilized  - Neurology (Dr. Derry Lory) to evaluate on arrival to [facility], if symptoms have been stable for several days and patient is normotensive may  not need ICU bed nor neurology primary but this will be determined after full in-person evaluation; ED to ED transfer to expedite evaluation - Okay to continue home Keppra at this time (noting friend took away patients medications so he missed several doses)  - Please notify neurology on arrival to [facility] MD-PhD [facility] [phone] Available 7 AM to 7 PM, outside these hours please contact Neurologist on call listed on AMION

## 2022-08-30 NOTE — ED Notes (Signed)
Patient transported to CT 

## 2022-08-30 NOTE — ED Provider Notes (Signed)
10:36 PM Patient appears stable upon transfer from [facility]. Awake, alert, no acute distress. Discussed with Dr. Derry Lory, who will come see patient and decide on neuro vs hospitalist admit (given this is potentially days old).    Pricilla Loveless, MD 08/30/22 2236
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
## 2022-08-30 NOTE — ED Notes (Signed)
Patient placed on cardiac monitoring.

## 2022-08-30 NOTE — ED Triage Notes (Addendum)
Pt's friend states pt has not been himself for a week.  Pt alert and oriented to age, month and year, not to place (stated he's at [facility]). Pt with c/o lower back pain which is new per pt. + generalized weakness. Friend states pt has starting hanging with another friend and not himself since. Pt denies any drug use.  Pt's friend states that pt usually drinks beer daily but has not had one for a week.  Pt denies any nausea or emesis.
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
## 2022-08-31 ENCOUNTER — Observation Stay (HOSPITAL_COMMUNITY): Payer: 59

## 2022-08-31 DIAGNOSIS — R930 Abnormal findings on diagnostic imaging of skull and head, not elsewhere classified: Secondary | ICD-10-CM | POA: Diagnosis not present

## 2022-08-31 DIAGNOSIS — E876 Hypokalemia: Secondary | ICD-10-CM

## 2022-08-31 DIAGNOSIS — S06360A Traumatic hemorrhage of cerebrum, unspecified, without loss of consciousness, initial encounter: Secondary | ICD-10-CM | POA: Diagnosis not present

## 2022-08-31 DIAGNOSIS — R569 Unspecified convulsions: Secondary | ICD-10-CM | POA: Diagnosis not present

## 2022-08-31 DIAGNOSIS — I611 Nontraumatic intracerebral hemorrhage in hemisphere, cortical: Secondary | ICD-10-CM | POA: Diagnosis not present

## 2022-08-31 DIAGNOSIS — I619 Nontraumatic intracerebral hemorrhage, unspecified: Secondary | ICD-10-CM | POA: Diagnosis present

## 2022-08-31 DIAGNOSIS — G459 Transient cerebral ischemic attack, unspecified: Secondary | ICD-10-CM | POA: Diagnosis not present

## 2022-08-31 DIAGNOSIS — G936 Cerebral edema: Secondary | ICD-10-CM | POA: Diagnosis not present

## 2022-08-31 DIAGNOSIS — R29818 Other symptoms and signs involving the nervous system: Secondary | ICD-10-CM | POA: Diagnosis not present

## 2022-08-31 DIAGNOSIS — G40909 Epilepsy, unspecified, not intractable, without status epilepticus: Secondary | ICD-10-CM

## 2022-08-31 LAB — CBC
HCT: 41.6 % (ref 39.0–52.0)
Hemoglobin: 14.3 g/dL (ref 13.0–17.0)
MCH: 33.7 pg (ref 26.0–34.0)
MCHC: 34.4 g/dL (ref 30.0–36.0)
MCV: 98.1 fL (ref 80.0–100.0)
Platelets: 223 10*3/uL (ref 150–400)
RBC: 4.24 MIL/uL (ref 4.22–5.81)
RDW: 14.2 % (ref 11.5–15.5)
WBC: 11.7 10*3/uL — ABNORMAL HIGH (ref 4.0–10.5)
nRBC: 0 % (ref 0.0–0.2)

## 2022-08-31 LAB — LIPID PANEL
Cholesterol: 124 mg/dL (ref 0–200)
HDL: 51 mg/dL (ref >40)
LDL Cholesterol: 61 mg/dL (ref 0–99)
Total CHOL/HDL Ratio: 2.4 RATIO
Triglycerides: 62 mg/dL (ref <150)
VLDL: 12 mg/dL (ref 0–40)

## 2022-08-31 LAB — MAGNESIUM: Magnesium: 1.8 mg/dL (ref 1.7–2.4)

## 2022-08-31 LAB — BASIC METABOLIC PANEL
Anion gap: 7 (ref 5–15)
BUN: 10 mg/dL (ref 8–23)
CO2: 27 mmol/L (ref 22–32)
Calcium: 8.6 mg/dL — ABNORMAL LOW (ref 8.9–10.3)
Chloride: 101 mmol/L (ref 98–111)
Creatinine, Ser: 0.94 mg/dL (ref 0.61–1.24)
GFR, Estimated: >60 mL/min (ref >60)
Glucose, Bld: 138 mg/dL — ABNORMAL HIGH (ref 70–99)
Potassium: 3 mmol/L — ABNORMAL LOW (ref 3.5–5.1)
Sodium: 135 mmol/L (ref 135–145)

## 2022-08-31 LAB — HEMOGLOBIN A1C
Hgb A1c MFr Bld: 4.8 % (ref 4.8–5.6)
Mean Plasma Glucose: 91.06 mg/dL

## 2022-08-31 MED ORDER — THIAMINE MONONITRATE 100 MG PO TABS
100.0000 mg | ORAL_TABLET | Freq: Every day | ORAL | Status: DC
  Administered 2022-08-31 – 2022-09-02 (×3): 100 mg via ORAL
  Filled 2022-08-31 (×3): qty 1

## 2022-08-31 MED ORDER — NICOTINE POLACRILEX 2 MG MT GUM: 2.0000 mg | CHEWING_GUM | OROMUCOSAL | Status: DC | PRN

## 2022-08-31 MED ORDER — PANTOPRAZOLE SODIUM 40 MG PO TBEC
40.0000 mg | DELAYED_RELEASE_TABLET | Freq: Every day | ORAL | Status: DC
  Administered 2022-08-31 – 2022-09-02 (×3): 40 mg via ORAL
  Filled 2022-08-31 (×3): qty 1

## 2022-08-31 MED ORDER — POTASSIUM CHLORIDE CRYS ER 20 MEQ PO TBCR
40.0000 meq | EXTENDED_RELEASE_TABLET | Freq: Two times a day (BID) | ORAL | Status: AC
  Administered 2022-08-31 (×2): 40 meq via ORAL
  Filled 2022-08-31 (×2): qty 2

## 2022-08-31 MED ORDER — PROSIGHT PO TABS
1.0000 | ORAL_TABLET | Freq: Every day | ORAL | Status: DC
  Administered 2022-08-31 – 2022-09-02 (×3): 1 via ORAL
  Filled 2022-08-31 (×3): qty 1

## 2022-08-31 MED ORDER — LABETALOL HCL 5 MG/ML IV SOLN: 10.0000 mg | INTRAVENOUS | Status: DC | PRN

## 2022-08-31 MED ORDER — HYDRALAZINE HCL 20 MG/ML IJ SOLN: 5.0000 mg | INTRAMUSCULAR | Status: DC | PRN

## 2022-08-31 MED ORDER — POTASSIUM CHLORIDE CRYS ER 20 MEQ PO TBCR
40.0000 meq | EXTENDED_RELEASE_TABLET | Freq: Once | ORAL | Status: AC
  Administered 2022-08-31: 40 meq via ORAL
  Filled 2022-08-31: qty 2

## 2022-08-31 MED ORDER — ACETAMINOPHEN 650 MG RE SUPP: 650.0000 mg | Freq: Four times a day (QID) | RECTAL | Status: DC | PRN

## 2022-08-31 MED ORDER — FOLIC ACID 1 MG PO TABS
1.0000 mg | ORAL_TABLET | Freq: Every day | ORAL | Status: DC
  Administered 2022-08-31 – 2022-09-02 (×3): 1 mg via ORAL
  Filled 2022-08-31 (×3): qty 1

## 2022-08-31 MED ORDER — GADOBUTROL 1 MMOL/ML IV SOLN
7.5000 mL | Freq: Once | INTRAVENOUS | Status: AC | PRN
  Administered 2022-08-31: 7.5 mL via INTRAVENOUS

## 2022-08-31 MED ORDER — ACETAMINOPHEN 325 MG PO TABS
650.0000 mg | ORAL_TABLET | Freq: Four times a day (QID) | ORAL | Status: DC | PRN
  Administered 2022-08-31: 650 mg via ORAL
  Filled 2022-08-31: qty 2

## 2022-08-31 MED ORDER — ORAL CARE MOUTH RINSE: 15.0000 mL | OROMUCOSAL | Status: DC | PRN

## 2022-08-31 NOTE — Procedures (Signed)
Patient Name: Connor Baker  MRN: [id number]  Epilepsy Attending: Charlsie Quest  Referring Physician/Provider: Erick Blinks, MD  Date: 08/31/2022 Duration: 24.21 mins  Patient history: 62 yo M with confusion and right leg weakness. EEG to evaluate for seizure.  Level of alertness: Awake  AEDs during EEG study: None  Technical aspects: This EEG study was done with scalp electrodes positioned according to the 10-20 International system of electrode placement. Electrical activity was reviewed with band pass filter of 1-70Hz , sensitivity of 7 uV/mm, display speed of 56mm/sec with a 60Hz  notched filter applied as appropriate. EEG data were recorded continuously and digitally stored.  Video monitoring was available and reviewed as appropriate.  Description: The posterior dominant rhythm consists of 8 Hz activity of moderate voltage (25-35 uV) seen predominantly in posterior head regions, symmetric and reactive to eye opening and eye closing. EEG showed intermittent left>right frontal 3 to 6 Hz theta-delta slowing. Physiologic photic driving was seen during photic stimulation.  Hyperventilation was not performed.     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 No seizures or epileptiform discharges were seen throughout the recording.  [name]

## 2022-08-31 NOTE — Progress Notes (Signed)
MRI came for patient.  

## 2022-08-31 NOTE — Evaluation (Signed)
Clinical/Bedside Swallow Evaluation Patient Details  Name: Connor Baker MRN: [id number] Date of Birth: [birth year]  Today's Date: 08/31/2022 Time: SLP Start Time (ACUTE ONLY): 1243 SLP Stop Time (ACUTE ONLY): 1153 SLP Time Calculation (min) (ACUTE ONLY): 15 min  Past Medical History:  Past Medical History:  Diagnosis Date   Arthritis    "legs" (08/28/2016)   CAD (coronary artery disease), native coronary artery 08/28/2016   LHC 09/2016  1st RPLB lesion, 100 %stenosed.  Post Atrio-1 lesion, 50 %stenosed.  Mid RCA to Dist RCA lesion, 5 %stenosed.  Post Atrio-2 lesion, 50 %stenosed.  Ost Cx lesion, 25 %stenosed.  Mid RCA lesion, 30 %stenosed.  Ost LAD to Prox LAD lesion, 40 %stenosed.  The left ventricular systolic function is normal.  LV end diastolic pressure is normal.  The left ventricular ejection fraction is 55-65%   Chronic lower back pain    CVA (cerebral vascular accident) (HCC)    Depression    GERD (gastroesophageal reflux disease)    High cholesterol    History of seizures 04/05/2022   Hypertension    Hypertension, essential    MI (myocardial infarction) (HCC) 1992; 1994; 1995   MI (myocardial infarction) (HCC)    [id number]   On home oxygen therapy    "4L just at night w/my CPAP" (08/28/2016)   OSA (obstructive sleep apnea)    w/oxygen" (08/28/2016)   Stroke (HCC) 2005   "mild one; faded my memory" (08/28/2016)   Subdural hematoma (HCC) 07/21/2020   Past Surgical History:  Past Surgical History:  Procedure Laterality Date   ANKLE FRACTURE SURGERY Right 2016   CORONARY ANGIOPLASTY     CORONARY ANGIOPLASTY WITH STENT PLACEMENT  1992 - 2017   "I've got a total of 16 stents in me" (08/28/2016)   CORONARY BALLOON ANGIOPLASTY Right 08/28/2016   Procedure: Coronary Balloon Angioplasty;  Surgeon: Lyn Records, MD;  Location: [facility] INVASIVE CV LAB;  Service: Cardiovascular;  Laterality: Right;   FRACTURE SURGERY     LEFT HEART CATH AND CORONARY ANGIOGRAPHY N/A 08/28/2016   Procedure:  Left Heart Cath and Coronary Angiography;  Surgeon: Lyn Records, MD;  Location: [facility] INVASIVE CV LAB;  Service: Cardiovascular;  Laterality: N/A;   LEFT HEART CATH AND CORONARY ANGIOGRAPHY N/A 10/24/2016   Procedure: LEFT HEART CATH AND CORONARY ANGIOGRAPHY;  Surgeon: [place], Peter M, MD;  [facility]: [facility] INVASIVE CV LAB;  Service: Cardiovascular;  Laterality: N/A;   LEFT HEART CATH AND CORONARY ANGIOGRAPHY N/A 04/07/2022   Procedure: LEFT HEART CATH AND CORONARY ANGIOGRAPHY;  Surgeon: Runell Gess, MD;  Location: [facility] INVASIVE CV LAB;  Service: Cardiovascular;  Laterality: N/A;   TOTAL HIP ARTHROPLASTY Right 1990   HPI:  Pt is a 62 y.o. male who presented for evaluation of confusion and right leg weakness. MRI brain 7/7: mixed intra-axial and extra-axial hemorrhage concentrated at the frontal lobes. Mild edema around the left frontal intraparenchymal hemorrhage. PMH: CAD status post PCI, CVA, hypertension, hyperlipidemia, OSA on CPAP, subdural hematoma in May 2022, seizures.    Assessment / Plan / Recommendation  Clinical Impression  Pt was seen for bedside swallow evaluation and he denied a history of dysphagia. Oral mechanism exam was [facility]. Dentition was limited with maxillary dentures and reduced mandibular teeth. He tolerated all solids and liquids without signs or symptoms of oropharyngeal dysphagia. A regular texture diet with thin liquids is recommended at this time and further skilled SLP services are not clinically indicated for swallowing, but SLP will follow  for completion of speech-language-cognition evaluation. SLP Visit Diagnosis: Dysphagia, unspecified (R13.10)    Aspiration Risk  No limitations    Diet Recommendation Regular;Thin liquid    Liquid Administration via: Straw;Cup Medication Administration: Whole meds with liquid    Other  Recommendations Oral Care Recommendations: Oral care BID    Recommendations for follow up therapy are one component of a multi-disciplinary  discharge planning process, led by the attending physician.  Recommendations may be updated based on patient status, additional functional criteria and insurance authorization.  Follow up Recommendations No SLP follow up      Assistance Recommended at Discharge    Functional Status Assessment Patient has not had a recent decline in their functional status  Frequency and Duration            Prognosis        Swallow Study   General Date of Onset: 08/30/22 HPI: Pt is a 62 y.o. male who presented for evaluation of confusion and right leg weakness. MRI brain 7/7: mixed intra-axial and extra-axial hemorrhage concentrated at the frontal lobes. Mild edema around the left frontal intraparenchymal hemorrhage. PMH: CAD status post PCI, CVA, hypertension, hyperlipidemia, OSA on CPAP, subdural hematoma in May 2022, seizures. Type of Study: Bedside Swallow Evaluation Previous Swallow Assessment: none Diet Prior to this Study: Regular;Thin liquids (Level 0) Temperature Spikes Noted: No Respiratory Status: Room air History of Recent Intubation: No Behavior/Cognition: Alert;Cooperative;Pleasant mood Oral Cavity Assessment: Within Functional Limits Oral Care Completed by SLP: No Oral Cavity - Dentition: Dentures, top;Missing dentition Vision: Functional for self-feeding Self-Feeding Abilities: Able to feed self Patient Positioning: Upright in chair Baseline Vocal Quality: Normal Volitional Cough: Strong Volitional Swallow: Able to elicit    Oral/Motor/Sensory Function Overall Oral Motor/Sensory Function: Within functional limits   Ice Chips Ice chips: Not tested   Thin Liquid Thin Liquid: Within functional limits Presentation: Straw    Nectar Thick Nectar Thick Liquid: Not tested   Honey Thick Honey Thick Liquid: Not tested   Puree Puree: Within functional limits Presentation: Spoon   Solid     Solid: Within functional limits Presentation: Self Fed     Aleiya Rye I. Vear Clock, MS,  CCC-SLP Acute Rehabilitation Services Office number [phone]  Scheryl Marten 08/31/2022,12:53 PM

## 2022-08-31 NOTE — ED Notes (Signed)
PT can not be transported at this time, was in a MRI at the 41 min mark and when he came back they started a EEG on him

## 2022-08-31 NOTE — ED Notes (Signed)
Patient transported to MRI 

## 2022-08-31 NOTE — H&P (Signed)
History and Physical    Connor Baker ZOX:[id number] DOB: 13-Oct-1960 DOA: 08/30/2022  PCP: Patient, No Pcp Per  Patient coming from: Home  Chief Complaint: Altered mental status  HPI: Connor Baker is a 62 y.o. male with medical history significant of CAD status post PCI, CVA, hypertension, hyperlipidemia, OSA on CPAP, subdural hematoma in May 2022, seizures presented to [facility] ED for evaluation of confusion and right leg weakness.  Patient was accompanied by a friend who told ED staff that normally patient drinks heavily but stopped drinking alcohol a week ago.  CT head without contrast showing: "IMPRESSION: 1. Mixed intra-axial and extra-axial hemorrhage of the superior cerebrum. The left frontal intraparenchymal hematoma measures 3.5 x 1.9 x 3.4 cm (11 mL). 2. Parafalcine subdural blood and subarachnoid blood over the right cingulate gyrus. 3. Small amount of blood layering in the left lateral ventricle occipital horn. No midline shift or hydrocephalus."  CT angiogram head and neck negative for LVO and showing no aneurysm or vascular malformation.  CT venogram head showing no evidence of dural venous sinus thrombosis.  He was transferred to [facility] and evaluated by neurologist who felt that the etiology of patient's intraparenchymal hemorrhage was unclear at this time.  Repeat CT head showing slight increase in size of left frontal intracerebral hemorrhage.  Brain MRI subsequently done and showing unchanged appearance of mixed intra-axial and extra-axial hemorrhage concentrated in the frontal lobes.  Showing mild edema around the left frontal intraparenchymal hemorrhage.  Neurology felt that the patient did not need ICU admission and requested admission by hospitalist service.  Patient received IV Keppra 1000 mg and IV potassium in the ED.  Labs showing no anemia or thrombocytopenia, potassium 2.9.  History limited as patient is slow to respond to questions and appears  slightly confused.  States his friend brought him to the emergency room to be evaluated.  He is reporting headaches.  Denies nausea or vomiting.  He takes aspirin and Plavix at home.  No other complaints.  Review of Systems:  Review of Systems  All other systems reviewed and are negative.   Past Medical History:  Diagnosis Date   Arthritis    "legs" (08/28/2016)   CAD (coronary artery disease), native coronary artery 08/28/2016   LHC 09/2016  1st RPLB lesion, 100 %stenosed.  Post Atrio-1 lesion, 50 %stenosed.  Mid RCA to Dist RCA lesion, 5 %stenosed.  Post Atrio-2 lesion, 50 %stenosed.  Ost Cx lesion, 25 %stenosed.  Mid RCA lesion, 30 %stenosed.  Ost LAD to Prox LAD lesion, 40 %stenosed.  The left ventricular systolic function is normal.  LV end diastolic pressure is normal.  The left ventricular ejection fraction is 55-65%   Chronic lower back pain    CVA (cerebral vascular accident) (HCC)    Depression    GERD (gastroesophageal reflux disease)    High cholesterol    History of seizures 04/05/2022   Hypertension    Hypertension, essential    MI (myocardial infarction) (HCC) 1992; 1994; 1995   MI (myocardial infarction) (HCC)    [phone]   On home oxygen therapy    "4L just at night w/my CPAP" (08/28/2016)   OSA (obstructive sleep apnea)    w/oxygen" (08/28/2016)   Stroke [facility]) 2005   "mild one; faded my memory" (08/28/2016)   Subdural hematoma (HCC) 07/21/2020    Past Surgical History:  Procedure Laterality Date   ANKLE FRACTURE SURGERY Right 2016   CORONARY ANGIOPLASTY  CORONARY ANGIOPLASTY WITH STENT PLACEMENT  1992 - 2017   "I've got a total of 16 stents in me" (08/28/2016)   CORONARY BALLOON ANGIOPLASTY Right 08/28/2016   Procedure: Coronary Balloon Angioplasty;  Surgeon: Lyn Records, MD;  Location: [facility] INVASIVE CV LAB;  Service: Cardiovascular;  Laterality: Right;   FRACTURE SURGERY     LEFT HEART CATH AND CORONARY ANGIOGRAPHY N/A 08/28/2016   Procedure: Left Heart Cath  and Coronary Angiography;  Surgeon: Lyn Records, MD;  Location: [facility] INVASIVE CV LAB;  Service: Cardiovascular;  Laterality: N/A;   LEFT HEART CATH AND CORONARY ANGIOGRAPHY N/A 10/24/2016   Procedure: LEFT HEART CATH AND CORONARY ANGIOGRAPHY;  Surgeon: [place], Peter M, MD;  Location: [facility] INVASIVE CV LAB;  Service: Cardiovascular;  Laterality: N/A;   LEFT HEART CATH AND CORONARY ANGIOGRAPHY N/A 04/07/2022   Procedure: LEFT HEART CATH AND CORONARY ANGIOGRAPHY;  Surgeon: Runell Gess, MD;  Location: [facility] INVASIVE CV LAB;  Service: Cardiovascular;  Laterality: N/A;   TOTAL HIP ARTHROPLASTY Right 1990     reports that he has been smoking cigarettes. He has a 2.00 pack-year smoking history. He has never used smokeless tobacco. He reports current alcohol use. He reports that he does not currently use drugs.  No Known Allergies  Family History  Problem Relation Age of Onset   Diabetes Mother    Heart attack Father 72    Prior to Admission medications   Medication Sig Start Date End Date Taking? Authorizing Provider  ascorbic acid (VITAMIN C) 500 MG tablet Take 500 mg by mouth daily.    [provider]  aspirin EC 325 MG tablet Take 325 mg by mouth daily.    [provider]  aspirin EC 81 MG EC tablet Take 1 tablet (81 mg total) by mouth daily. Swallow whole. 05/16/20   Filbert Schilder, NP  atorvastatin (LIPITOR) 80 MG tablet Take 1 tablet (80 mg total) by mouth daily. 05/16/20   Georgie Chard D, NP  cholecalciferol (VITAMIN D3) 25 MCG (1000 UNIT) tablet Take 1,000 Units by mouth daily.    [provider]  clopidogrel (PLAVIX) 75 MG tablet Take 1 tablet (75 mg total) by mouth daily. 05/15/20   Georgie Chard D, NP  cyanocobalamin (VITAMIN B12) 1000 MCG tablet Take 1,000 mcg by mouth daily.    [provider]  levETIRAcetam (KEPPRA) 500 MG tablet Take 1 tablet (500 mg total) by mouth 2 (two) times daily. 07/23/20   Berna Bue, MD  naproxen (NAPROSYN) 500 MG  tablet Take 500 mg by mouth 2 (two) times daily. 06/24/22   [provider]  nitroGLYCERIN (NITROSTAT) 0.4 MG SL tablet Place 1 tablet (0.4 mg total) under the tongue every 5 (five) minutes x 3 doses as needed for chest pain. 01/02/18   Filbert Schilder, NP  omega-3 acid ethyl esters (LOVAZA) 1 g capsule Take 1 capsule (1 g total) by mouth 2 (two) times daily. Patient not taking: Reported on 07/02/2022 04/08/22   Rai, Delene Ruffini, MD  Omega-3 Fatty Acids (FISH OIL) 1000 MG CAPS Take by mouth. 06/24/22   [provider]  PAIN RELIEF EXTRA STRENGTH 500 MG tablet Take 500 mg by mouth 2 (two) times daily as needed for mild pain. 07/07/22   [provider]  pantoprazole (PROTONIX) 40 MG tablet Take 1 tablet (40 mg total) by mouth daily. 04/09/22   Rai, Delene Ruffini, MD  ranolazine (RANEXA) 500 MG 12 hr tablet Take 1 tablet (500  mg total) by mouth 2 (two) times daily. 11/26/20   Tilden Fossa, MD  silver sulfADIAZINE (SILVADENE) 1 % cream Apply 1 Application topically daily. 08/06/22   Burgess Amor, PA-C    Physical Exam: Vitals:   08/31/22 0045 08/31/22 0128 08/31/22 0419 08/31/22 0435  BP: 122/71  135/86 (!) 125/93  Pulse: 67  (!) 55 (!) 54  Resp: 16   18  Temp:  98.3 F (36.8 C) 98 F (36.7 C) 98.6 F (37 C)  TempSrc:  Oral Oral Oral  SpO2: 95%  99% 92%  Weight:      Height:        Physical Exam Vitals reviewed.  Constitutional:      General: He is not in acute distress. HENT:     Head: Normocephalic and atraumatic.  Eyes:     Extraocular Movements: Extraocular movements intact.  Cardiovascular:     Rate and Rhythm: Regular rhythm. Bradycardia present.     Pulses: Normal pulses.     Comments: Slightly bradycardic Pulmonary:     Effort: Pulmonary effort is normal.     Breath sounds: Normal breath sounds.  Abdominal:     General: Bowel sounds are normal. There is no distension.     Palpations: Abdomen is soft.     Tenderness: There is no abdominal tenderness.   Musculoskeletal:     Cervical back: Normal range of motion.     Right lower leg: No edema.     Left lower leg: No edema.  Skin:    General: Skin is warm and dry.  Neurological:     Mental Status: He is alert.     Cranial Nerves: No cranial nerve deficit.     Sensory: No sensory deficit.     Motor: No weakness.     Comments: Awake and alert, slow to respond to questions and slightly confused     Labs on Admission: I have personally reviewed following labs and imaging studies  CBC: Recent Labs  Lab 08/30/22 1823  WBC 9.0  NEUTROABS 5.2  HGB 15.2  HCT 43.9  MCV 99.3  PLT 222   Basic Metabolic Panel: Recent Labs  Lab 08/30/22 1823  NA 137  K 2.9*  CL 101  CO2 26  GLUCOSE 142*  BUN 16  CREATININE 0.94  CALCIUM 9.3   GFR: Estimated Creatinine Clearance: 83.1 mL/min (by C-G formula based on SCr of 0.94 mg/dL). Liver Function Tests: Recent Labs  Lab 08/30/22 1823  AST 20  ALT 23  ALKPHOS 71  BILITOT 0.7  PROT 6.9  ALBUMIN 3.5   No results for input(s): "LIPASE", "AMYLASE" in the last 168 hours. No results for input(s): "AMMONIA" in the last 168 hours. Coagulation Profile: Recent Labs  Lab 08/30/22 1823  INR 0.9   Cardiac Enzymes: No results for input(s): "CKTOTAL", "CKMB", "CKMBINDEX", "TROPONINI" in the last 168 hours. BNP (last 3 results) No results for input(s): "PROBNP" in the last 8760 hours. HbA1C: Recent Labs    08/31/22 0212  HGBA1C 4.8   CBG: No results for input(s): "GLUCAP" in the last 168 hours. Lipid Profile: Recent Labs    08/31/22 0212  CHOL 124  HDL 51  LDLCALC 61  TRIG 62  CHOLHDL 2.4   Thyroid Function Tests: No results for input(s): "TSH", "T4TOTAL", "FREET4", "T3FREE", "THYROIDAB" in the last 72 hours. Anemia Panel: No results for input(s): "VITAMINB12", "FOLATE", "FERRITIN", "TIBC", "IRON", "RETICCTPCT" in the last 72 hours. Urine analysis:    Component Value Date/Time  COLORURINE YELLOW 08/30/2022 2004    APPEARANCEUR CLEAR 08/30/2022 2004   LABSPEC 1.027 08/30/2022 2004   PHURINE 6.0 08/30/2022 2004   GLUCOSEU 50 (A) 08/30/2022 2004   HGBUR NEGATIVE 08/30/2022 2004   BILIRUBINUR NEGATIVE 08/30/2022 2004   KETONESUR NEGATIVE 08/30/2022 2004   PROTEINUR 30 (A) 08/30/2022 2004   NITRITE NEGATIVE 08/30/2022 2004   LEUKOCYTESUR NEGATIVE 08/30/2022 2004    Radiological Exams on Admission: MR BRAIN W WO CONTRAST  Result Date: 08/31/2022 CLINICAL DATA:  Transient ischemic attack.  Hemorrhage. EXAM: MRI HEAD WITHOUT AND WITH CONTRAST TECHNIQUE: Multiplanar, multiecho pulse sequences of the brain and surrounding structures were obtained without and with intravenous contrast. CONTRAST:  7.12mL GADAVIST GADOBUTROL 1 MMOL/ML IV SOLN COMPARISON:  Head CT 08/31/2022 FINDINGS: Brain: No acute infarct. Unchanged appearance of mixed intra-axial and extra-axial hemorrhage concentrated at the frontal lobes. Mild edema around the left frontal intraparenchymal hemorrhage. Brain parenchyma is otherwise normal. The midline structures are normal. There is no abnormal contrast enhancement. Vascular: Major flow voids are preserved. Skull and upper cervical spine: Normal calvarium and skull base. Visualized upper cervical spine and soft tissues are normal. Sinuses/Orbits:No paranasal sinus fluid levels or advanced mucosal thickening. No mastoid or middle ear effusion. Normal orbits. IMPRESSION: Unchanged appearance of mixed intra-axial and extra-axial hemorrhage concentrated at the frontal lobes. Mild edema around the left frontal intraparenchymal hemorrhage. Electronically Signed   By: Deatra Robinson M.D.   On: 08/31/2022 03:23   CT HEAD WO CONTRAST ( )  Result Date: 08/31/2022 CLINICAL DATA:  Neuro deficit, acute, stroke suspected EXAM: CT HEAD WITHOUT CONTRAST TECHNIQUE: Contiguous axial images were obtained from the base of the skull through the vertex without intravenous contrast. RADIATION DOSE REDUCTION: This exam was  performed according to the departmental dose-optimization program which includes automated exposure control, adjustment of the mA and/or kV according to patient size and/or use of iterative reconstruction technique. COMPARISON:  08/30/2022 FINDINGS: Brain: Again seen is the left frontal intracerebral hematoma measuring 3.9 x 3.5 x 2.2 cm (15 mL) compared to 11 mL previously. Parafalcine subdural and subarachnoid blood again noted, stable. Small amount of blood layering in the left lateral ventricle occipital bowl worn, stable. No midline shift. Vascular: No hyperdense vessel or unexpected calcification. Skull: No acute calvarial abnormality. Sinuses/Orbits: No acute findings Other: None IMPRESSION: Left frontal intracerebral hemorrhage slightly increased in size now 15 mL in volume compared to 11 mL previously. Parafalcine subarachnoid and subdural blood as well as blood layering in the occipital horn of the left lateral ventricle stable since prior study. Electronically Signed   By: Charlett Nose M.D.   On: 08/31/2022 01:50   CT VENOGRAM HEAD  Result Date: 08/30/2022 CLINICAL DATA:  Sudden severe headache EXAM: CT VENOGRAM HEAD TECHNIQUE: Venographic phase images of the brain were obtained following the administration of intravenous contrast. Multiplanar reformats and maximum intensity projections were generated. RADIATION DOSE REDUCTION: This exam was performed according to the departmental dose-optimization program which includes automated exposure control, adjustment of the mA and/or kV according to patient size and/or use of iterative reconstruction technique. CONTRAST:  75mL OMNIPAQUE IOHEXOL 350 MG/ML SOLN COMPARISON:  None Available. FINDINGS: Superior sagittal sinus: Normal. Straight sinus: Normal. Inferior sagittal sinus, vein of Galen and internal cerebral veins: Normal. Transverse sinuses: Normal. Sigmoid sinuses: Normal. Visualized jugular veins: Normal. IMPRESSION: No evidence of dural venous sinus  thrombosis. Electronically Signed   By: Deatra Robinson M.D.   On: 08/30/2022 19:54   CT ANGIO HEAD NECK W  WO CM  Result Date: 08/30/2022 CLINICAL DATA:  Headache and altered mental status EXAM: CT ANGIOGRAPHY HEAD AND NECK WITH AND WITHOUT CONTRAST TECHNIQUE: Multidetector CT imaging of the head and neck was performed using the standard protocol during bolus administration of intravenous contrast. Multiplanar CT image reconstructions and MIPs were obtained to evaluate the vascular anatomy. Carotid stenosis measurements (when applicable) are obtained utilizing NASCET criteria, using the distal internal carotid diameter as the denominator. RADIATION DOSE REDUCTION: This exam was performed according to the departmental dose-optimization program which includes automated exposure control, adjustment of the mA and/or kV according to patient size and/or use of iterative reconstruction technique. CONTRAST:  75mL OMNIPAQUE IOHEXOL 350 MG/ML SOLN COMPARISON:  None Available. FINDINGS: CT HEAD FINDINGS Brain: There is no mass, hemorrhage or extra-axial collection. The size and configuration of the ventricles and extra-axial CSF spaces are normal. There is no acute or chronic infarction. The brain parenchyma is normal. Skull: The visualized skull base, calvarium and extracranial soft tissues are normal. Sinuses/Orbits: No fluid levels or advanced mucosal thickening of the visualized paranasal sinuses. No mastoid or middle ear effusion. The orbits are normal. CTA NECK FINDINGS SKELETON: There is no bony spinal canal stenosis. No lytic or blastic lesion. OTHER NECK: Normal pharynx, larynx and major salivary glands. No cervical lymphadenopathy. Unremarkable thyroid gland. UPPER CHEST: No pneumothorax or pleural effusion. No nodules or masses. AORTIC ARCH: There is calcific atherosclerosis of the aortic arch. Conventional 3 vessel aortic branching pattern. RIGHT CAROTID SYSTEM: No dissection, occlusion or aneurysm. Mild  atherosclerotic calcification at the carotid bifurcation without hemodynamically significant stenosis. LEFT CAROTID SYSTEM: No dissection, occlusion or aneurysm. Mild atherosclerotic calcification at the carotid bifurcation without hemodynamically significant stenosis. VERTEBRAL ARTERIES: Left dominant configuration.There is no dissection, occlusion or flow-limiting stenosis to the skull base (V1-V3 segments). CTA HEAD FINDINGS POSTERIOR CIRCULATION: --Vertebral arteries: Normal V4 segments. --Inferior cerebellar arteries: Normal. --Basilar artery: Normal. --Superior cerebellar arteries: Normal. --Posterior cerebral arteries (PCA): Normal. ANTERIOR CIRCULATION: --Intracranial internal carotid arteries: Normal. --Anterior cerebral arteries (ACA): Normal. Both A1 segments are present. Patent anterior communicating artery (a-comm). --Middle cerebral arteries (MCA): Normal. VENOUS SINUSES: As permitted by contrast timing, patent. ANATOMIC VARIANTS: None Review of the MIP images confirms the above findings. IMPRESSION: 1. No emergent large vessel occlusion or hemodynamically significant stenosis of the head or neck. 2. Mild bilateral carotid bifurcation atherosclerosis without hemodynamically significant stenosis. 3. No aneurysm or vascular malformation. Aortic Atherosclerosis (ICD10-I70.0). Electronically Signed   By: Deatra Robinson M.D.   On: 08/30/2022 19:48   CT Lumbar Spine Wo Contrast  Result Date: 08/30/2022 CLINICAL DATA:  Trauma EXAM: CT LUMBAR SPINE WITHOUT CONTRAST TECHNIQUE: Multidetector CT imaging of the lumbar spine was performed without intravenous contrast administration. Multiplanar CT image reconstructions were also generated. RADIATION DOSE REDUCTION: This exam was performed according to the departmental dose-optimization program which includes automated exposure control, adjustment of the mA and/or kV according to patient size and/or use of iterative reconstruction technique. COMPARISON:  None  Available. FINDINGS: Segmentation: 5 lumbar type vertebrae. Alignment: Normal. Vertebrae: No acute fracture or focal pathologic process. Paraspinal and other soft tissues: Calcific aortic atherosclerosis Disc levels: Multilevel degenerative disc disease and mild facet arthrosis without high-grade spinal canal stenosis. Narrowing of the lateral recesses at L5-S1. IMPRESSION: 1. No acute fracture or static subluxation of the lumbar spine. 2. Multilevel degenerative disc disease and mild facet arthrosis without high-grade spinal canal stenosis. Aortic Atherosclerosis (ICD10-I70.0). Electronically Signed   By: Chrisandra Netters.D.  On: 08/30/2022 19:07   CT Head Wo Contrast  Result Date: 08/30/2022 CLINICAL DATA:  Altered mental status EXAM: CT HEAD WITHOUT CONTRAST TECHNIQUE: Contiguous axial images were obtained from the base of the skull through the vertex without intravenous contrast. RADIATION DOSE REDUCTION: This exam was performed according to the departmental dose-optimization program which includes automated exposure control, adjustment of the mA and/or kV according to patient size and/or use of iterative reconstruction technique. COMPARISON:  07/02/2022 FINDINGS: Brain: There is mixed intra-axial and extra-axial hemorrhage of the superior cerebrum. The left frontal intraparenchymal hematoma measures 3.5 x 1.9 x 3.4 cm (11 mL). There is parafalcine subdural blood and subarachnoid blood over the right cingulate gyrus. No midline shift or hydrocephalus. Small amount of blood layering in the left lateral ventricle occipital horn. Vascular: No abnormal hyperdensity of the major intracranial arteries or dural venous sinuses. No intracranial atherosclerosis. Skull: The visualized skull base, calvarium and extracranial soft tissues are normal. Sinuses/Orbits: No fluid levels or advanced mucosal thickening of the visualized paranasal sinuses. No mastoid or middle ear effusion. The orbits are normal. IMPRESSION: 1.  Mixed intra-axial and extra-axial hemorrhage of the superior cerebrum. The left frontal intraparenchymal hematoma measures 3.5 x 1.9 x 3.4 cm (11 mL). 2. Parafalcine subdural blood and subarachnoid blood over the right cingulate gyrus. 3. Small amount of blood layering in the left lateral ventricle occipital horn. No midline shift or hydrocephalus. Critical Value/emergent results were called by telephone at the time of interpretation on 08/30/2022 at 7:02 pm to Dr. Hyacinth Meeker, who verbally acknowledged these results. Electronically Signed   By: Deatra Robinson M.D.   On: 08/30/2022 19:02   DG Chest Port 1 View  Result Date: 08/30/2022 CLINICAL DATA:  Altered EXAM: PORTABLE CHEST 1 VIEW COMPARISON:  Chest x-ray dated Jul 02, 2022 FINDINGS: The heart size and mediastinal contours are within normal limits. Both lungs are clear. The visualized skeletal structures are unremarkable. IMPRESSION: No active disease. Electronically Signed   By: Allegra Lai M.D.   On: 08/30/2022 18:29    EKG: Independently reviewed.  Sinus rhythm, no significant change since prior tracing.  Assessment and Plan  Intraparenchymal hemorrhage Etiology unclear.  Neurology managing, appreciate recommendations.  Admit to progressive care unit, neurochecks every 2 hours, seizure precautions.  Patient received IV Keppra 1000 mg in the ED.  Hold aspirin and Plavix.  Currently normotensive, goal is to keep SBP <160.  Stroke labs, A1c, fasting lipid panel.  Risk factor modification.  PT/OT/SLP.  Hypokalemia Monitor potassium and magnesium levels, continue to replace as needed.  CAD status post PCI Troponin negative x 2.  Patient is not endorsing chest pain.  Hold aspirin and Plavix given IPH  History of previous stroke Hold aspirin and Plavix given IPH.  Hypertension Currently normotensive, goal is to keep SBP <160.  Avoid labetalol due to slight bradycardia.  IV hydralazine as needed.  Seizure disorder He received IV Keppra 1000 mg  in the ED.  Continue home dose of Keppra after pharmacy med rec is done.  Alcohol use Reportedly drinks heavily but his friend told ED staff that patient has not consumed alcohol for a week.  No signs of withdrawal.  DVT prophylaxis: SCDs Code Status: Full Code (discussed with the patient) Family Communication: No family available at this time. Consults called: Neurology Level of care: Progressive Care Unit Admission status: It is my clinical opinion that referral for OBSERVATION is reasonable and necessary in this patient based on the above information provided. The  aforementioned taken together are felt to place the patient at high risk for further clinical deterioration. However, it is anticipated that the patient may be medically stable for discharge from the hospital within 24 to 48 hours.   John Giovanni MD [facility]  If 7PM-7AM, please contact night-coverage [email or web address]  08/31/2022, 5:06 AM

## 2022-08-31 NOTE — Progress Notes (Signed)
EEG complete - results pending 
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
## 2022-08-31 NOTE — ED Notes (Signed)
Patient transported to CT 

## 2022-08-31 NOTE — Consult Note (Signed)
NEUROLOGY CONSULTATION NOTE   Date of service: August 31, 2022 Patient Name: Connor Baker MRN:  [id number] DOB:  07-Apr-1960 Reason for consult: "ICH" Requesting Provider: Melene Plan, DO _ _ _   _ __   _ __ _ _  __ __   _ __   __ _  History of Present Illness  Connor Baker is a 62 y.o. male with PMH significant for prior stroke, hypertension, MI, obstructive sleep apnea, prior subdural, EtOH abuse who presents with confusion and right leg weakness.  Initially tells me that his symptoms started about 7 days ago.  He has been confused for the last 7 days and this was getting worse so he came into the ED.  In addition, he also reports that his right leg has been giving him trouble for the last 2 days.  He however, he is unsure of exactly when his symptoms started. Denies any falls. Chart review suggests that typically drinks about 24 beers a day but has not been drinking any for the last week.  Also been less interactive.  He initially presented to [facility] emergency department where he had CT head without contrast which demonstrated left frontal intraparenchymal hematoma along with extra-axial hematoma along the falx and some arachnoid and subdural hemorrhage over the right with some extension into the ventricles too.  He had CTA which was negative for an LVO or aneurysm or AVM.  He had CT venogram which was negative for dural venous sinus thrombosis.  He continues to endorse feleing foggy in his head. He seemed to have been accompanied by a friend to [facility] but I attempted to call his friend on the listed phone number but unable to get in touch with him.  LKW: 08/29/22. mRS: 0 tNKASE: not offered 2/2 ICH Thrombectomy: not offered 2/2 ICH ICH score: 1(intraventricular extension) NIHSS components Score: Comment  1a Level of Conscious 0[x]  1[]  2[]  3[]      1b LOC Questions 0[x]  1[]  2[]       1c LOC Commands 0[x]  1[]  2[]       2 Best Gaze 0[x]  1[]  2[]       3 Visual 0[x]  1[]  2[]  3[]      4  Facial Palsy 0[x]  1[]  2[]  3[]      5a Motor Arm - left 0[x]  1[]  2[]  3[]  4[]  UN[]    5b Motor Arm - Right 0[x]  1[]  2[]  3[]  4[]  UN[]    6a Motor Leg - Left 0[x]  1[]  2[]  3[]  4[]  UN[]    6b Motor Leg - Right 0[]  1[x]  2[]  3[]  4[]  UN[]    7 Limb Ataxia 0[x]  1[]  2[]  3[]  UN[]     8 Sensory 0[x]  1[]  2[]  UN[]      9 Best Language 0[x]  1[]  2[]  3[]      10 Dysarthria 0[x]  1[]  2[]  UN[]      11 Extinct. and Inattention 0[x]  1[]  2[]       TOTAL: 1     ROS   Constitutional Denies weight loss, fever and chills.   HEENT Denies changes in vision and hearing.   Respiratory Denies SOB and cough.   CV Denies palpitations and CP   GI Denies abdominal pain, nausea, vomiting and diarrhea.   GU Denies dysuria and urinary frequency.   MSK Denies myalgia and joint pain.   Skin Denies rash and pruritus.   Neurological Denies headache and syncope.   Psychiatric Denies recent changes in mood. Denies anxiety and depression.    Past History   Past Medical History:  Diagnosis  Date   Arthritis    "legs" (08/28/2016)   CAD (coronary artery disease), native coronary artery 08/28/2016   LHC 09/2016  1st RPLB lesion, 100 %stenosed.  Post Atrio-1 lesion, 50 %stenosed.  Mid RCA to Dist RCA lesion, 5 %stenosed.  Post Atrio-2 lesion, 50 %stenosed.  Ost Cx lesion, 25 %stenosed.  Mid RCA lesion, 30 %stenosed.  Ost LAD to Prox LAD lesion, 40 %stenosed.  The left ventricular systolic function is normal.  LV end diastolic pressure is normal.  The left ventricular ejection fraction is 55-65%   Chronic lower back pain    CVA (cerebral vascular accident) (HCC)    Depression    GERD (gastroesophageal reflux disease)    High cholesterol    History of seizures 04/05/2022   Hypertension    Hypertension, essential    MI (myocardial infarction) (HCC) 1992; 1994; 1995   MI (myocardial infarction) (HCC)    [phone]   On home oxygen therapy    "4L just at night w/my CPAP" (08/28/2016)   OSA (obstructive sleep apnea)    w/oxygen"  (08/28/2016)   Stroke (HCC) 2005   "mild one; faded my memory" (08/28/2016)   Subdural hematoma (HCC) 07/21/2020   Past Surgical History:  Procedure Laterality Date   ANKLE FRACTURE SURGERY Right 2016   CORONARY ANGIOPLASTY     CORONARY ANGIOPLASTY WITH STENT PLACEMENT  1992 - 2017   "I've got a total of 16 stents in me" (08/28/2016)   CORONARY BALLOON ANGIOPLASTY Right 08/28/2016   Procedure: Coronary Balloon Angioplasty;  Surgeon: Lyn Records, MD;  Location: [facility] INVASIVE CV LAB;  Service: Cardiovascular;  Laterality: Right;   FRACTURE SURGERY     LEFT HEART CATH AND CORONARY ANGIOGRAPHY N/A 08/28/2016   Procedure: Left Heart Cath and Coronary Angiography;  Surgeon: Lyn Records, MD;  Location: [facility] INVASIVE CV LAB;  Service: Cardiovascular;  Laterality: N/A;   LEFT HEART CATH AND CORONARY ANGIOGRAPHY N/A 10/24/2016   Procedure: LEFT HEART CATH AND CORONARY ANGIOGRAPHY;  Surgeon: Swaziland, Peter M, MD;  Location: [facility] INVASIVE CV LAB;  Service: Cardiovascular;  Laterality: N/A;   LEFT HEART CATH AND CORONARY ANGIOGRAPHY N/A 04/07/2022   Procedure: LEFT HEART CATH AND CORONARY ANGIOGRAPHY;  Surgeon: Runell Gess, MD;  Location: [facility] INVASIVE CV LAB;  Service: Cardiovascular;  Laterality: N/A;   TOTAL HIP ARTHROPLASTY Right 1990   Family History  Problem Relation Age of Onset   Diabetes Mother    Heart attack Father 55   Social History   Socioeconomic History   Marital status: Single    Spouse name: Not on file   Number of children: Not on file   Years of education: Not on file   Highest education level: Not on file  Occupational History   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     Social Determinants of Health   Financial Resource Strain: High Risk (10/11/2017)   Overall Financial Resource Strain (CARDIA)    Difficulty of Paying Living Expenses: Hard  Food Insecurity: No Food Insecurity (06/04/2022)   Hunger Vital Sign    Worried About Running Out of Food in the Last Year: Never true    Ran Out of Food in the Last Year: Never true  Transportation Needs: No Transportation Needs (06/04/2022)   PRAPARE - Administrator, Civil Service (Medical): No    Lack of Transportation (Non-Medical): No  Physical Activity: Not on file  Stress: Not on file  Social Connections: Not on file   No Known Allergies  Medications  (Not in a hospital admission)    Vitals   Vitals:   08/30/22 2030 08/30/22 2100 08/30/22 2130 08/30/22 2142  BP: 134/87 129/86 126/87 134/89  Pulse: 66 67 63 63  Resp: 20 19 19 18   Temp:      TempSrc:      SpO2: 96% 95% 96% 96%  Weight:      Height:         Body mass index is 24.39 kg/m.  Physical Exam   General: Laying comfortably in bed; in no acute distress.  HENT: Normal oropharynx and mucosa. Normal external appearance of ears and nose.  Neck: Supple, no pain or tenderness  CV: No JVD. No peripheral edema.  Pulmonary: Symmetric Chest rise. Normal respiratory effort.  Abdomen: Soft to touch, non-tender.  Ext: No cyanosis, edema, or deformity  Skin: No rash. Normal palpation of skin.   Musculoskeletal: Normal digits and nails by inspection. No clubbing.   Neurologic Examination  Mental status/Cognition: slow, bradyphrenic, oriented to self, place, month and year, poor attention. Executive dysfunction with difficulty with doing simple calculations, serial 7s. Speech/language: mildly  non fluent, comprehension intact, object naming intact, repetition intact. Cranial nerves:   CN II Pupils equal and reactive to light, no VF deficits    CN III,IV,VI EOM intact, no gaze preference or deviation, no nystagmus    CN V normal sensation in V1, V2, and V3 segments bilaterally    CN VII no asymmetry, no nasolabial fold flattening    CN VIII normal hearing to speech    CN IX & X normal palatal elevation, no uvular deviation    CN XI 5/5 head turn and 5/5 shoulder shrug bilaterally    CN XII midline tongue protrusion    Motor:  Muscle bulk: normal, tone normal, pronator drift none tremor none Mvmt Root Nerve  Muscle Right Left Comments  SA C5/6 Ax Deltoid 5 5   EF C5/6 Mc Biceps 5 5   EE C6/7/8 Rad Triceps 5 5   WF C6/7 Med FCR     WE C7/8 PIN ECU     F Ab C8/T1 U ADM/FDI 5 5   HF L1/2/3 Fem Illopsoas 4 5   KE L2/3/4 Fem Quad 4 5   DF L4/5 D Peron Tib Ant 4 5   PF S1/2 Tibial Grc/Sol 4 5    Sensation:  Light touch Intact throughout   Pin prick    Temperature    Vibration   Proprioception    Coordination/Complex Motor:  - Finger to Nose intact bilaterally - Heel to shin unable to do due to right leg weakness and somewhat difficult for him to understand how to do this. - Rapid alternating movement are intact  bilaterally - Gait: deferred.  Labs   CBC:  Recent Labs  Lab 08/30/22 1823  WBC 9.0  NEUTROABS 5.2  HGB 15.2  HCT 43.9  MCV 99.3  PLT 222    Basic Metabolic Panel:  Lab Results  Component Value Date   NA 137 08/30/2022   K 2.9 (L) 08/30/2022   CO2 26 08/30/2022   GLUCOSE 142 (H) 08/30/2022   BUN 16 08/30/2022   CREATININE 0.94 08/30/2022   CALCIUM 9.3 08/30/2022   GFRNONAA >60 08/30/2022   GFRAA >60 10/12/2019   Lipid Panel:  Lab Results  Component Value Date   LDLCALC 35 04/06/2022   HgbA1c:  Lab Results  Component Value Date   HGBA1C 5.2 04/07/2022   Urine Drug Screen:     Component Value Date/Time   LABOPIA NONE DETECTED  08/30/2022 2004   COCAINSCRNUR NONE DETECTED 08/30/2022 2004   LABBENZ NONE DETECTED 08/30/2022 2004   AMPHETMU NONE DETECTED 08/30/2022 2004   THCU NONE DETECTED 08/30/2022 2004   LABBARB NONE DETECTED 08/30/2022 2004    Alcohol Level     Component Value Date/Time   ETH <10 08/30/2022 1831    CT Head without contrast(Personally reviewed): 1. Mixed intra-axial and extra-axial hemorrhage of the superior cerebrum. The left frontal intraparenchymal hematoma measures 3.5 x 1.9 x 3.4 cm (11 mL). 2. Parafalcine subdural blood and subarachnoid blood over the right cingulate gyrus. 3. Small amount of blood layering in the left lateral ventricle occipital horn. No midline shift or hydrocephalus.  CT angio Head and Neck with contrast(Personally reviewed): 1. No emergent large vessel occlusion or hemodynamically significant stenosis of the head or neck. 2. Mild bilateral carotid bifurcation atherosclerosis without hemodynamically significant stenosis. 3. No aneurysm or vascular malformation.  MRI Brain: pending  rEEG:  pending  Impression   Connor Baker is a 62 y.o. male with PMH significant for prior stroke, hypertension, MI, obstructive sleep apnea, prior subdural, EtOH abuse who presents with confusion and right leg weakness. He was found to have left frontal intraparenchymal hematoma along with extra-axial hematoma along the falx and some arachnoid and subdural hemorrhage over the right with some extension into the ventricles too. His neurologic examination is notable for confusion, executive dysfunction and R leg weakness.  The location of his IPH is very atypical and etiology is unclear at this time.  Recommendations  - admit to hospitalist service. LKW is atleast 2 days ago. - Stability scan in 6 hours or STAT with any neurological decline(ordered) - Q2H Neuro checks on the floor - No antiplatelets or anticoagulants due to ICH - SCD for DVT prophylaxis, pharmacological DVT  ppx at 24 hours if ICH is stable - Blood pressure control with goal systolic 160. Can use PRN Labetalol for SBP above 160. Seems to be spontaneously maintaining his blood pressure within goal. - Stroke labs, HgbA1c, fasting lipid panel - MRI brain with and without contrast when stabilized to evaluate for underlying mass(ordered) - Risk factor modification - PT consult, OT consult, Speech consult. - Stroke team to follow  ______________________________________________________________________   Thank you for the opportunity to take part in the care of this patient. If you have any further questions, please contact the neurology consultation attending.  Signed,  Erick Blinks [facility] _ _ _   _ __   _ __ _ _  __ __   _ __   __ _

## 2022-08-31 NOTE — Evaluation (Signed)
Speech Language Pathology Evaluation Patient Details Name: Connor Baker MRN: [id number] DOB: 1961-01-01 Today's Date: 08/31/2022 Time: 4098-1191 SLP Time Calculation (min) (ACUTE ONLY): 25 min  Problem List:  Patient Active Problem List   Diagnosis Date Noted   Intraparenchymal hemorrhage of brain (HCC) 08/31/2022   Hypokalemia 08/31/2022   Seizure disorder (HCC) 08/31/2022   GERD (gastroesophageal reflux disease) 04/05/2022   OSA (obstructive sleep apnea) 04/05/2022   History of seizures 04/05/2022   Allergic rhinitis 04/05/2022   Subdural hematoma (HCC) 07/21/2020   Chest pain 05/15/2020   Unstable angina (HCC) 12/31/2017   Tobacco abuse 10/11/2017   HLD (hyperlipidemia) 08/29/2016   CAD (coronary artery disease), native coronary artery 08/28/2016   Hypertension, essential    Past Medical History:  Past Medical History:  Diagnosis Date   Arthritis    "legs" (08/28/2016)   CAD (coronary artery disease), native coronary artery 08/28/2016   LHC 09/2016  1st RPLB lesion, 100 %stenosed.  Post Atrio-1 lesion, 50 %stenosed.  Mid RCA to Dist RCA lesion, 5 %stenosed.  Post Atrio-2 lesion, 50 %stenosed.  Ost Cx lesion, 25 %stenosed.  Mid RCA lesion, 30 %stenosed.  Ost LAD to Prox LAD lesion, 40 %stenosed.  The left ventricular systolic function is normal.  LV end diastolic pressure is normal.  The left ventricular ejection fraction is 55-65%   Chronic lower back pain    CVA (cerebral vascular accident) (HCC)    Depression    GERD (gastroesophageal reflux disease)    High cholesterol    History of seizures 04/05/2022   Hypertension    Hypertension, essential    MI (myocardial infarction) (HCC) 1992; 1994; 1995   MI (myocardial infarction) (HCC)    [phone]   On home oxygen therapy    "4L just at night w/my CPAP" (08/28/2016)   OSA (obstructive sleep apnea)    w/oxygen" (08/28/2016)   Stroke (HCC) 2005   "mild one; faded my memory" (08/28/2016)   Subdural hematoma (HCC) 07/21/2020    Past Surgical History:  Past Surgical History:  Procedure Laterality Date   ANKLE FRACTURE SURGERY Right 2016   CORONARY ANGIOPLASTY     CORONARY ANGIOPLASTY WITH STENT PLACEMENT  1992 - 2017   "I've got a total of 16 stents in me" (08/28/2016)   CORONARY BALLOON ANGIOPLASTY Right 08/28/2016   Procedure: Coronary Balloon Angioplasty;  Surgeon: Lyn Records, MD;  Location: [facility] INVASIVE CV LAB;  Service: Cardiovascular;  Laterality: Right;   FRACTURE SURGERY     LEFT HEART CATH AND CORONARY ANGIOGRAPHY N/A 08/28/2016   Procedure: Left Heart Cath and Coronary Angiography;  Surgeon: Lyn Records, MD;  Location: [facility] INVASIVE CV LAB;  Service: Cardiovascular;  Laterality: N/A;   LEFT HEART CATH AND CORONARY ANGIOGRAPHY N/A 10/24/2016   Procedure: LEFT HEART CATH AND CORONARY ANGIOGRAPHY;  Surgeon: Swazil[place], Peter M, MD;  Location: [facility] INVASIVE CV LAB;  Service: Cardiovascular;  Laterality: N/A;   LEFT HEART CATH AND CORONARY ANGIOGRAPHY N/A 04/07/2022   Procedure: LEFT HEART CATH AND CORONARY ANGIOGRAPHY;  Surgeon: Runell Gess, MD;  Location: [facility] INVASIVE CV LAB;  Service: Cardiovascular;  Laterality: N/A;   TOTAL HIP ARTHROPLASTY Right 1990   HPI:  Pt is a 62 y.o. male who presented for evaluation of confusion and right leg weakness. MRI brain 7/7: mixed intra-axial and extra-axial hemorrhage concentrated at the frontal lobes. Mild edema around the left frontal intraparenchymal hemorrhage. PMH: CAD status post PCI, CVA, hypertension, hyperlipidemia, OSA on CPAP, subdural hematoma  in May 2022, seizures.   Assessment / Plan / Recommendation Clinical Impression  Pt participated in speech-language-cognition evaluation. 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 Pt denied any baseline deficits in speech, language, or cognition, but stated that this friend manages his medications and  bills. With the pt's permission, his friend, Thayer Ohm, was contacted by this SLP to assess pt's baseline. Thayer Ohm reported that the pt's cognition was WNL at baseline and that the pt was helping him with tasks around the house. The [facility] Mental Status Examination was completed to evaluate the pt's cognitive-linguistic skills. He achieved a score of 5/30 which is below the normal limits of 27 or more out of 30. He exhibited difficulty in the areas of attention, memory, and executive function as it relates to problem solving, mental flexibility, and awareness. Motor speech skills were [facility] without evidence of dysarthria/apraxia of speech. He exhibited difficulty during the divergent naming task, and additional processing time was necessary throughout the evaluation. These appeared to be more cognitively based due to inattention and difficulty with initiation, than secondary to a primary linguistic impairment. Skilled SLP services are clinically indicated at this time to improve cognitive function.    SLP Assessment  SLP Recommendation/Assessment: Patient needs continued Speech Lanaguage Pathology Services SLP Visit Diagnosis: Cognitive communication deficit (R41.841)    Recommendations for follow up therapy are one component of a multi-disciplinary discharge planning process, led by the attending physician.  Recommendations may be updated based on patient status, additional functional criteria and insurance authorization.    Follow Up Recommendations  Outpatient SLP    Assistance Recommended at Discharge  Frequent or constant Supervision/Assistance  Functional Status Assessment Patient has had a recent decline in their functional status and demonstrates the ability to make significant improvements in function in a reasonable and predictable amount of time.  Frequency and Duration min 2x/week  2 weeks      SLP Evaluation Cognition  Overall Cognitive Status: Impaired/Different from  baseline Arousal/Alertness: Awake/alert Orientation Level: Oriented to person;Oriented to place;Oriented to situation Year: 2024 Month: July Day of Week: Incorrect Attention: Focused;Sustained Focused Attention: Appears intact Sustained Attention: Impaired Sustained Attention Impairment: Verbal complex Memory: Impaired Memory Impairment:  (Immediate: 5/5 with repetition x4; delayed:) Awareness: Impaired Awareness Impairment: Emergent impairment Problem Solving: Impaired Problem Solving Impairment: Verbal complex (Money: 0/3) Executive Function: Sequencing;Organizing Sequencing: Impaired Sequencing Impairment: Verbal complex Organizing: Impaired Organizing Impairment: Verbal complex (backward digit span: 0/2)       Comprehension  Auditory Comprehension Overall Auditory Comprehension: Appears within functional limits for tasks assessed Yes/No Questions: Within Functional Limits Commands: Within Functional Limits Conversation: Complex    Expression Expression Primary Mode of Expression: Verbal Verbal Expression Initiation: No impairment Level of Generative/Spontaneous Verbalization: Conversation Repetition: No impairment Pragmatics: Impairment Impairments: Abnormal affect;Eye contact   Oral / Motor  Oral Motor/Sensory Function Overall Oral Motor/Sensory Function: Within functional limits Motor Speech Overall Motor Speech: Appears within functional limits for tasks assessed Respiration: Within functional limits Phonation: Normal Resonance: Within functional limits Articulation: Within functional limitis Intelligibility: Intelligible Motor Planning: Witnin functional limits Motor Speech Errors: Not applicable           Burnell Hurta I. Vear Clock, MS, CCC-SLP Acute Rehabilitation Services Office number [phone]  Scheryl Marten 08/31/2022, 2:38 PM

## 2022-08-31 NOTE — Progress Notes (Signed)
PROGRESS NOTE    NOTNAMED SWOVELAND  WUJ:[id number] DOB: [birth year] DOA: 08/30/2022 PCP: Patient, No Pcp Per   Brief Narrative:    Connor Baker is a 62 y.o. male with medical history significant of CAD status post PCI, CVA, hypertension, hyperlipidemia, OSA on CPAP, subdural hematoma in May 2022, seizures presented to [facility] ED for evaluation of confusion and right leg weakness. Neurology following and patient still under observation.  Assessment & Plan:   Principal Problem:   Intraparenchymal hemorrhage of brain [facility]) Active Problems:   Hypertension, essential   CAD (coronary artery disease), native coronary artery   Hypokalemia   Seizure disorder (HCC)  Assessment and Plan:   Intraparenchymal hemorrhage Etiology unclear.  Neurology managing, appreciate recommendations.  Admit to progressive care unit, neurochecks every 2 hours, seizure precautions.  Patient received IV Keppra 1000 mg in the ED.  Hold aspirin and Plavix.  Currently normotensive, goal is to keep SBP <160.  Stroke labs, A1c, fasting lipid panel.  Risk factor modification.  PT/OT/SLP. LDL 61 and statin not indicated SLP recommending regular diet PT with no further recommendations Neurology following   Hypokalemia Monitor potassium and magnesium levels, continue to replace as needed.   CAD status post PCI Troponin negative x 2.  Patient is not endorsing chest pain.  Hold aspirin and Plavix given IPH   History of previous stroke Hold aspirin and Plavix given IPH.   Hypertension Currently normotensive, goal is to keep SBP <160.  Avoid labetalol due to slight bradycardia.  IV hydralazine as needed.   Seizure disorder He received IV Keppra 1000 mg in the ED.  Continue home dose of Keppra after pharmacy med rec is done.   Alcohol use Reportedly drinks heavily but his friend told ED staff that patient has not consumed alcohol for a week.  No signs of withdrawal.    DVT prophylaxis:SCDs Code Status:  Full Family Communication: None at bedside Disposition Plan:  Status is: Observation The patient remains OBS appropriate and will d/c before 2 midnights.   Consultants:  Neurology  Procedures:  None  Antimicrobials:  None   Subjective: Patient seen and evaluated today with no new acute complaints or concerns. No acute concerns or events noted overnight. Continues to have a mild headache.  Objective: Vitals:   08/31/22 0045 08/31/22 0128 08/31/22 0419 08/31/22 0435  BP: 122/71  135/86 (!) 125/93  Pulse: 67  (!) 55 (!) 54  Resp: 16   18  Temp:  98.3 F (36.8 C) 98 F (36.7 C) 98.6 F (37 C)  TempSrc:  Oral Oral Oral  SpO2: 95%  99% 92%  Weight:      Height:        Intake/Output Summary (Last 24 hours) at 08/31/2022 0720 Last data filed at 08/31/2022 0538 Gross per 24 hour  Intake 325 ml  Output 300 ml  Net 25 ml   Filed Weights   08/30/22 1739  Weight: 77.1 kg    Examination:  General exam: Appears calm and comfortable  Respiratory system: Clear to auscultation. Respiratory effort normal. Cardiovascular system: S1 & S2 heard, RRR.  Gastrointestinal system: Abdomen is soft Central nervous system: Alert and awake Extremities: No edema Skin: No significant lesions noted Psychiatry: Flat affect.    Data Reviewed: I have personally reviewed following labs and imaging studies  CBC: Recent Labs  Lab 08/30/22 1823 08/31/22 0658  WBC 9.0 11.7*  NEUTROABS 5.2  --   HGB 15.2 14.3  HCT 43.9 41.6  MCV 99.3 98.1  PLT 222 223   Basic Metabolic Panel: Recent Labs  Lab 08/30/22 1823  NA 137  K 2.9*  CL 101  CO2 26  GLUCOSE 142*  BUN 16  CREATININE 0.94  CALCIUM 9.3   GFR: Estimated Creatinine Clearance: 83.1 mL/min (by C-G formula based on SCr of 0.94 mg/dL). Liver Function Tests: Recent Labs  Lab 08/30/22 1823  AST 20  ALT 23  ALKPHOS 71  BILITOT 0.7  PROT 6.9  ALBUMIN 3.5   No results for input(s): "LIPASE", "AMYLASE" in the last 168  hours. No results for input(s): "AMMONIA" in the last 168 hours. Coagulation Profile: Recent Labs  Lab 08/30/22 1823  INR 0.9   Cardiac Enzymes: No results for input(s): "CKTOTAL", "CKMB", "CKMBINDEX", "TROPONINI" in the last 168 hours. BNP (last 3 results) No results for input(s): "PROBNP" in the last 8760 hours. HbA1C: Recent Labs    08/31/22 0212  HGBA1C 4.8   CBG: No results for input(s): "GLUCAP" in the last 168 hours. Lipid Profile: Recent Labs    08/31/22 0212  CHOL 124  HDL 51  LDLCALC 61  TRIG 62  CHOLHDL 2.4   Thyroid Function Tests: No results for input(s): "TSH", "T4TOTAL", "FREET4", "T3FREE", "THYROIDAB" in the last 72 hours. Anemia Panel: No results for input(s): "VITAMINB12", "FOLATE", "FERRITIN", "TIBC", "IRON", "RETICCTPCT" in the last 72 hours. Sepsis Labs: No results for input(s): "PROCALCITON", "LATICACIDVEN" in the last 168 hours.  No results found for this or any previous visit (from the past 240 hour(s)).       Radiology Studies: MR BRAIN W WO CONTRAST  Result Date: 08/31/2022 CLINICAL DATA:  Transient ischemic attack.  Hemorrhage. EXAM: MRI HEAD WITHOUT AND WITH CONTRAST TECHNIQUE: Multiplanar, multiecho pulse sequences of the brain and surrounding structures were obtained without and with intravenous contrast. CONTRAST:  7.29mL GADAVIST GADOBUTROL 1 MMOL/ML IV SOLN COMPARISON:  Head CT 08/31/2022 FINDINGS: Brain: No acute infarct. Unchanged appearance of mixed intra-axial and extra-axial hemorrhage concentrated at the frontal lobes. Mild edema around the left frontal intraparenchymal hemorrhage. Brain parenchyma is otherwise normal. The midline structures are normal. There is no abnormal contrast enhancement. Vascular: Major flow voids are preserved. Skull and upper cervical spine: Normal calvarium and skull base. Visualized upper cervical spine and soft tissues are normal. Sinuses/Orbits:No paranasal sinus fluid levels or advanced mucosal  thickening. No mastoid or middle ear effusion. Normal orbits. IMPRESSION: Unchanged appearance of mixed intra-axial and extra-axial hemorrhage concentrated at the frontal lobes. Mild edema around the left frontal intraparenchymal hemorrhage. Electronically Signed   By: Deatra Robinson M.D.   On: 08/31/2022 03:23   CT HEAD WO CONTRAST ( )  Result Date: 08/31/2022 CLINICAL DATA:  Neuro deficit, acute, stroke suspected EXAM: CT HEAD WITHOUT CONTRAST TECHNIQUE: Contiguous axial images were obtained from the base of the skull through the vertex without intravenous contrast. RADIATION DOSE REDUCTION: This exam was performed according to the departmental dose-optimization program which includes automated exposure control, adjustment of the mA and/or kV according to patient size and/or use of iterative reconstruction technique. COMPARISON:  08/30/2022 FINDINGS: Brain: Again seen is the left frontal intracerebral hematoma measuring 3.9 x 3.5 x 2.2 cm (15 mL) compared to 11 mL previously. Parafalcine subdural and subarachnoid blood again noted, stable. Small amount of blood layering in the left lateral ventricle occipital bowl worn, stable. No midline shift. Vascular: No hyperdense vessel or unexpected calcification. Skull: No acute calvarial abnormality. Sinuses/Orbits: No acute findings Other: None IMPRESSION: Left frontal intracerebral hemorrhage  slightly increased in size now 15 mL in volume compared to 11 mL previously. Parafalcine subarachnoid and subdural blood as well as blood layering in the occipital horn of the left lateral ventricle stable since prior study. Electronically Signed   By: Charlett Nose M.D.   On: 08/31/2022 01:50   CT VENOGRAM HEAD  Result Date: 08/30/2022 CLINICAL DATA:  Sudden severe headache EXAM: CT VENOGRAM HEAD TECHNIQUE: Venographic phase images of the brain were obtained following the administration of intravenous contrast. Multiplanar reformats and maximum intensity projections were  generated. RADIATION DOSE REDUCTION: This exam was performed according to the departmental dose-optimization program which includes automated exposure control, adjustment of the mA and/or kV according to patient size and/or use of iterative reconstruction technique. CONTRAST:  75mL OMNIPAQUE IOHEXOL 350 MG/ML SOLN COMPARISON:  None Available. FINDINGS: Superior sagittal sinus: Normal. Straight sinus: Normal. Inferior sagittal sinus, vein of Galen and internal cerebral veins: Normal. Transverse sinuses: Normal. Sigmoid sinuses: Normal. Visualized jugular veins: Normal. IMPRESSION: No evidence of dural venous sinus thrombosis. Electronically Signed   By: Deatra Robinson M.D.   On: 08/30/2022 19:54   CT ANGIO HEAD NECK W WO CM  Result Date: 08/30/2022 CLINICAL DATA:  Headache and altered mental status EXAM: CT ANGIOGRAPHY HEAD AND NECK WITH AND WITHOUT CONTRAST TECHNIQUE: Multidetector CT imaging of the head and neck was performed using the standard protocol during bolus administration of intravenous contrast. Multiplanar CT image reconstructions and MIPs were obtained to evaluate the vascular anatomy. Carotid stenosis measurements (when applicable) are obtained utilizing NASCET criteria, using the distal internal carotid diameter as the denominator. RADIATION DOSE REDUCTION: This exam was performed according to the departmental dose-optimization program which includes automated exposure control, adjustment of the mA and/or kV according to patient size and/or use of iterative reconstruction technique. CONTRAST:  75mL OMNIPAQUE IOHEXOL 350 MG/ML SOLN COMPARISON:  None Available. FINDINGS: CT HEAD FINDINGS Brain: There is no mass, hemorrhage or extra-axial collection. The size and configuration of the ventricles and extra-axial CSF spaces are normal. There is no acute or chronic infarction. The brain parenchyma is normal. Skull: The visualized skull base, calvarium and extracranial soft tissues are normal.  Sinuses/Orbits: No fluid levels or advanced mucosal thickening of the visualized paranasal sinuses. No mastoid or middle ear effusion. The orbits are normal. CTA NECK FINDINGS SKELETON: There is no bony spinal canal stenosis. No lytic or blastic lesion. OTHER NECK: Normal pharynx, larynx and major salivary glands. No cervical lymphadenopathy. Unremarkable thyroid gland. UPPER CHEST: No pneumothorax or pleural effusion. No nodules or masses. AORTIC ARCH: There is calcific atherosclerosis of the aortic arch. Conventional 3 vessel aortic branching pattern. RIGHT CAROTID SYSTEM: No dissection, occlusion or aneurysm. Mild atherosclerotic calcification at the carotid bifurcation without hemodynamically significant stenosis. LEFT CAROTID SYSTEM: No dissection, occlusion or aneurysm. Mild atherosclerotic calcification at the carotid bifurcation without hemodynamically significant stenosis. VERTEBRAL ARTERIES: Left dominant configuration.There is no dissection, occlusion or flow-limiting stenosis to the skull base (V1-V3 segments). CTA HEAD FINDINGS POSTERIOR CIRCULATION: --Vertebral arteries: Normal V4 segments. --Inferior cerebellar arteries: Normal. --Basilar artery: Normal. --Superior cerebellar arteries: Normal. --Posterior cerebral arteries (PCA): Normal. ANTERIOR CIRCULATION: --Intracranial internal carotid arteries: Normal. --Anterior cerebral arteries (ACA): Normal. Both A1 segments are present. Patent anterior communicating artery (a-comm). --Middle cerebral arteries (MCA): Normal. VENOUS SINUSES: As permitted by contrast timing, patent. ANATOMIC VARIANTS: None Review of the MIP images confirms the above findings. IMPRESSION: 1. No emergent large vessel occlusion or hemodynamically significant stenosis of the head or neck. 2. Mild  bilateral carotid bifurcation atherosclerosis without hemodynamically significant stenosis. 3. No aneurysm or vascular malformation. Aortic Atherosclerosis (ICD10-I70.0). Electronically  Signed   By: Deatra Robinson M.D.   On: 08/30/2022 19:48   CT Lumbar Spine Wo Contrast  Result Date: 08/30/2022 CLINICAL DATA:  Trauma EXAM: CT LUMBAR SPINE WITHOUT CONTRAST TECHNIQUE: Multidetector CT imaging of the lumbar spine was performed without intravenous contrast administration. Multiplanar CT image reconstructions were also generated. RADIATION DOSE REDUCTION: This exam was performed according to the departmental dose-optimization program which includes automated exposure control, adjustment of the mA and/or kV according to patient size and/or use of iterative reconstruction technique. COMPARISON:  None Available. FINDINGS: Segmentation: 5 lumbar type vertebrae. Alignment: Normal. Vertebrae: No acute fracture or focal pathologic process. Paraspinal and other soft tissues: Calcific aortic atherosclerosis Disc levels: Multilevel degenerative disc disease and mild facet arthrosis without high-grade spinal canal stenosis. Narrowing of the lateral recesses at L5-S1. IMPRESSION: 1. No acute fracture or static subluxation of the lumbar spine. 2. Multilevel degenerative disc disease and mild facet arthrosis without high-grade spinal canal stenosis. Aortic Atherosclerosis (ICD10-I70.0). Electronically Signed   By: Deatra Robinson M.D.   On: 08/30/2022 19:07   CT Head Wo Contrast  Result Date: 08/30/2022 CLINICAL DATA:  Altered mental status EXAM: CT HEAD WITHOUT CONTRAST TECHNIQUE: Contiguous axial images were obtained from the base of the skull through the vertex without intravenous contrast. RADIATION DOSE REDUCTION: This exam was performed according to the departmental dose-optimization program which includes automated exposure control, adjustment of the mA and/or kV according to patient size and/or use of iterative reconstruction technique. COMPARISON:  07/02/2022 FINDINGS: Brain: There is mixed intra-axial and extra-axial hemorrhage of the superior cerebrum. The left frontal intraparenchymal hematoma  measures 3.5 x 1.9 x 3.4 cm (11 mL). There is parafalcine subdural blood and subarachnoid blood over the right cingulate gyrus. No midline shift or hydrocephalus. Small amount of blood layering in the left lateral ventricle occipital horn. Vascular: No abnormal hyperdensity of the major intracranial arteries or dural venous sinuses. No intracranial atherosclerosis. Skull: The visualized skull base, calvarium and extracranial soft tissues are normal. Sinuses/Orbits: No fluid levels or advanced mucosal thickening of the visualized paranasal sinuses. No mastoid or middle ear effusion. The orbits are normal. IMPRESSION: 1. Mixed intra-axial and extra-axial hemorrhage of the superior cerebrum. The left frontal intraparenchymal hematoma measures 3.5 x 1.9 x 3.4 cm (11 mL). 2. Parafalcine subdural blood and subarachnoid blood over the right cingulate gyrus. 3. Small amount of blood layering in the left lateral ventricle occipital horn. No midline shift or hydrocephalus. Critical Value/emergent results were called by telephone at the time of interpretation on 08/30/2022 at 7:02 pm to Dr. Hyacinth Meeker, who verbally acknowledged these results. Electronically Signed   By: Deatra Robinson M.D.   On: 08/30/2022 19:02   DG Chest Port 1 View  Result Date: 08/30/2022 CLINICAL DATA:  Altered EXAM: PORTABLE CHEST 1 VIEW COMPARISON:  Chest x-ray dated Jul 02, 2022 FINDINGS: The heart size and mediastinal contours are within normal limits. Both lungs are clear. The visualized skeletal structures are unremarkable. IMPRESSION: No active disease. Electronically Signed   By: Allegra Lai M.D.   On: 08/30/2022 18:29        Scheduled Meds:  potassium chloride  40 mEq Oral Once     LOS: 0 days    Time spent: 35 minutes    Elena Cothern Hoover Brunette, DO [facility]  If 7PM-7AM, please contact night-coverage [email or web address] 08/31/2022, 7:20 AM

## 2022-08-31 NOTE — Evaluation (Signed)
Physical Therapy Evaluation Patient Details Name: Connor Baker MRN: [id number] DOB: [birth year] Today's Date: 08/31/2022  History of Present Illness  62 y.o. male presented to [facility] ED 08/30/22 for evaluation of confusion and right leg weakness. CT head L frontal intraparenchymal hematoma  and some arachnoid and subdural hemorrhage over the right with some extension into the ventricles too.  PMH significant of CAD status post PCI, CVA, hypertension, hyperlipidemia, OSA on CPAP, subdural hematoma in May 2022, seizures  Clinical Impression   Pt admitted secondary to problem above with deficits below. PTA patient was living with a friend in a one level apartment on ground level (per pt, however noted some expressive difficulties during interview). Pt currently requires minguard assist for ambulation without a device. Overall, LE strength decreased bil, but ?effort and ?understanding commands for MMT. Anticipate he will not need follow-up PT, however from a language and ?cognitive perspective he will likely need 24/7 assist on discharge. ? If roommate can provide. Defer further recommendations to OT and SLP. Anticipate patient will benefit from PT to address problems listed below. Will continue to follow acutely to maximize functional mobility independence and safety.           Assistance Recommended at Discharge Frequent or constant Supervision/Assistance (due to language and ?cognitive deficits)  If plan is discharge home, recommend the following:  Can travel by private vehicle  Assistance with cooking/housework;Direct supervision/assist for medications management;Direct supervision/assist for financial management;Assist for transportation        Equipment Recommendations None recommended by PT  Recommendations for Other Services  OT consult;Speech consult    Functional Status Assessment Patient has had a recent decline in their functional status and demonstrates the ability to make  significant improvements in function in a reasonable and predictable amount of time.     Precautions / Restrictions Precautions Precautions: Fall Restrictions Weight Bearing Restrictions: No      Mobility  Bed Mobility Overal bed mobility: Needs Assistance Bed Mobility: Supine to Sit     Supine to sit: Min assist     General bed mobility comments: HOB flat and no rail; attempted x2 and unable without assist to raise torso    Transfers Overall transfer level: Needs assistance Equipment used: None Transfers: Sit to/from Stand Sit to Stand: Min guard           General transfer comment: no imbalance noted    Ambulation/Gait Ambulation/Gait assistance: Min assist, Min guard Gait Distance (Feet): 180 Feet Assistive device: 1 person hand held assist, None Gait Pattern/deviations: Step-through pattern   Gait velocity interpretation: 1.31 - 2.62 ft/sec, indicative of limited community ambulator   General Gait Details: initially with HHA for safety (on his Rt) and progressed to no UE support with no imbalance noted  Stairs            Wheelchair Mobility     Tilt Bed    Modified Rankin (Stroke Patients Only) Modified Rankin (Stroke Patients Only) Pre-Morbid Rankin Score: Slight disability (pt unable to explain why he was not working) Modified Rankin: Moderately severe disability     Balance Overall balance assessment: Needs assistance Sitting-balance support: No upper extremity supported, Feet supported Sitting balance-Leahy Scale: Good     Standing balance support: No upper extremity supported Standing balance-Leahy Scale: Good           Rhomberg - Eyes Opened: 30 Rhomberg - Eyes Closed: 10   High Level Balance Comments: Reaching forward <6 in and overhead with bil UE  without imbalance             Pertinent Vitals/Pain Pain Assessment Pain Assessment: No/denies pain    Home Living Family/patient expects to be discharged to:: Private  residence Living Arrangements: Non-relatives/Friends Available Help at Discharge: Friend(s);Available PRN/intermittently Type of Home: Apartment Home Access: Level entry       Home Layout: One level Home Equipment: Agricultural consultant (2 wheels);Shower seat Additional Comments: pt reported apartment; recent admission stated he lived in hotel/motel; pt unable to verbalize why he has a RW or shower seat, but states he did not use them PTA    Prior Function Prior Level of Function : Independent/Modified Independent (not driving or working per pt)             Mobility Comments: denies use of assistive device       Hand Dominance   Dominant Hand: Right    Extremity/Trunk Assessment   Upper Extremity Assessment Upper Extremity Assessment: Defer to OT evaluation    Lower Extremity Assessment Lower Extremity Assessment: Generalized weakness (symmetrical)    Cervical / Trunk Assessment Cervical / Trunk Assessment: Normal  Communication   Communication: Expressive difficulties  Cognition Arousal/Alertness: Awake/alert Behavior During Therapy: Flat affect Overall Cognitive Status: Difficult to assess                                 General Comments: following simple commands; not able to answer all questions with "I don't know" as his answer        General Comments General comments (skin integrity, edema, etc.): BP stable with SBP<160    Exercises     Assessment/Plan    PT Assessment Patient needs continued PT services  PT Problem List Decreased strength;Decreased mobility       PT Treatment Interventions Gait training;Functional mobility training;Therapeutic activities;Balance training;Patient/family education    PT Goals (Current goals can be found in the Care Plan section)  Acute Rehab PT Goals Patient Stated Goal: unable to state PT Goal Formulation: Patient unable to participate in goal setting Time For Goal Achievement: 09/14/22 Potential to  Achieve Goals: Good    Frequency Min 3X/week     Co-evaluation               AM-PAC PT "6 Clicks" Mobility  Outcome Measure Help needed turning from your back to your side while in a flat bed without using bedrails?: None Help needed moving from lying on your back to sitting on the side of a flat bed without using bedrails?: A Little Help needed moving to and from a bed to a chair (including a wheelchair)?: A Little Help needed standing up from a chair using your arms (e.g., wheelchair or bedside chair)?: A Little Help needed to walk in hospital room?: A Little Help needed climbing 3-5 steps with a railing? : A Little 6 Click Score: 19    End of Session Equipment Utilized During Treatment: Gait belt Activity Tolerance: Patient tolerated treatment well Patient left: in chair;with call bell/phone within reach;with chair alarm set Nurse Communication: Mobility status PT Visit Diagnosis: Muscle weakness (generalized) (M62.81);Difficulty in walking, not elsewhere classified (R26.2)    Time: 5621-3086 PT Time Calculation (min) (ACUTE ONLY): 18 min   Charges:   PT Evaluation $PT Eval Low Complexity: 1 Low   PT General Charges $$ ACUTE PT VISIT: 1 Visit          Jerolyn Center, PT Acute Rehabilitation  Services  Office [phone]   Zena Amos 08/31/2022, 10:14 AM

## 2022-08-31 NOTE — ED Notes (Signed)
ED TO INPATIENT HANDOFF REPORT  ED Nurse Name and Phone #: Rodney Booze [phone]  S Name/Age/Gender Connor Baker 62 y.o. male Room/Bed: 026C/026C  Code Status   Code Status: Prior  Home/SNF/Other Home Patient oriented to: self and place Is this baseline? Yes   Triage Complete: Triage complete  Chief Complaint ICH (intracerebral hemorrhage) (HCC) [I61.9]  Triage Note Pt's friend states pt has not been himself for a week.  Pt alert and oriented to age, month and year, not to place (stated he's at [facility]). Pt with c/o lower back pain which is new per pt. + generalized weakness. Friend states pt has starting hanging with another friend and not himself since. Pt denies any drug use.  Pt's friend states that pt usually drinks beer daily but has not had one for a week.  Pt denies any nausea or emesis.   Allergies No Known Allergies  Level of Care/Admitting Diagnosis ED Disposition     ED Disposition  Admit   Condition  --   Comment  Hospital Area: [facility] [[id number]]  Level of Care: Progressive [102]  Admit to Progressive based on following criteria: NEUROLOGICAL AND NEUROSURGICAL complex patients with significant risk of instability, who do not meet ICU criteria, yet require close observation or frequent assessment (< / = every 2 - 4 hours) with medical / nursing intervention.  May place patient in observation at [facility] or [facility] if equivalent level of care is available:: Yes  Covid Evaluation: Asymptomatic - no recent exposure (last 10 days) testing not required  Diagnosis: ICH (intracerebral hemorrhage) [facility]) [[id number]]  Admitting Physician: John Giovanni [[id number]]  Attending Physician: John Giovanni [[id number]]          B Medical/Surgery History Past Medical History:  Diagnosis Date   Arthritis    "legs" (08/28/2016)   CAD (coronary artery disease), native coronary artery 08/28/2016   LHC 09/2016  1st RPLB lesion, 100 %stenosed.   Post Atrio-1 lesion, 50 %stenosed.  Mid RCA to Dist RCA lesion, 5 %stenosed.  Post Atrio-2 lesion, 50 %stenosed.  Ost Cx lesion, 25 %stenosed.  Mid RCA lesion, 30 %stenosed.  Ost LAD to Prox LAD lesion, 40 %stenosed.  The left ventricular systolic function is normal.  LV end diastolic pressure is normal.  The left ventricular ejection fraction is 55-65%   Chronic lower back pain    CVA (cerebral vascular accident) (HCC)    Depression    GERD (gastroesophageal reflux disease)    High cholesterol    History of seizures 04/05/2022   Hypertension    Hypertension, essential    MI (myocardial infarction) (HCC) 1992; 1994; 1995   MI (myocardial infarction) (HCC)    [phone]   On home oxygen therapy    "4L just at night w/my CPAP" (08/28/2016)   OSA (obstructive sleep apnea)    w/oxygen" (08/28/2016)   Stroke (HCC) 2005   "mild one; faded my memory" (08/28/2016)   Subdural hematoma (HCC) 07/21/2020   Past Surgical History:  Procedure Laterality Date   ANKLE FRACTURE SURGERY Right 2016   CORONARY ANGIOPLASTY     CORONARY ANGIOPLASTY WITH STENT PLACEMENT  1992 - 2017   "I've got a total of 16 stents in me" (08/28/2016)   CORONARY BALLOON ANGIOPLASTY Right 08/28/2016   Procedure: Coronary Balloon Angioplasty;  Surgeon: Lyn Records, MD;  Location: [facility] INVASIVE CV LAB;  Service: Cardiovascular;  Laterality: Right;   FRACTURE SURGERY     LEFT HEART CATH AND  CORONARY ANGIOGRAPHY N/A 08/28/2016   Procedure: Left Heart Cath and Coronary Angiography;  Surgeon: Lyn Records, MD;  Location: [facility] INVASIVE CV LAB;  Service: Cardiovascular;  Laterality: N/A;   LEFT HEART CATH AND CORONARY ANGIOGRAPHY N/A 10/24/2016   Procedure: LEFT HEART CATH AND CORONARY ANGIOGRAPHY;  Surgeon: Swaziland, Peter M, MD;  Location: [facility] INVASIVE CV LAB;  Service: Cardiovascular;  Laterality: N/A;   LEFT HEART CATH AND CORONARY ANGIOGRAPHY N/A 04/07/2022   Procedure: LEFT HEART CATH AND CORONARY ANGIOGRAPHY;  Surgeon: Runell Gess, MD;  Location: [facility] INVASIVE CV LAB;  Service: Cardiovascular;  Laterality: N/A;   TOTAL HIP ARTHROPLASTY Right 1990     A IV Location/Drains/Wounds Patient Lines/Drains/Airways Status     Active Line/Drains/Airways     Name Placement date Placement time Site Days   Peripheral IV 08/30/22 18 G 1.16" Right Antecubital 08/30/22  1832  Antecubital  1            Intake/Output Last 24 hours  Intake/Output Summary (Last 24 hours) at 08/31/2022 0200 Last data filed at 08/30/2022 2104 Gross per 24 hour  Intake 325 ml  Output --  Net 325 ml    Labs/Imaging Results for orders placed or performed during the hospital encounter of 08/30/22 (from the past 48 hour(s))  Acetaminophen level     Status: Abnormal   Collection Time: 08/30/22  6:23 PM  Result Value Ref Range   Acetaminophen (Tylenol), Serum <10 (L) 10 - 30 ug/mL    Comment: (NOTE) Therapeutic concentrations vary significantly. A range of 10-30 ug/mL  may be an effective concentration for many patients. However, some  are best treated at concentrations outside of this range. Acetaminophen concentrations >150 ug/mL at 4 hours after ingestion  and >50 ug/mL at 12 hours after ingestion are often associated with  toxic reactions.  Performed at [facility], [address] [place], [place] [address]   Salicylate level     Status: Abnormal   Collection Time: 08/30/22  6:23 PM  Result Value Ref Range   Salicylate Lvl <7.0 (L) 7.0 - 30.0 mg/dL    Comment: Performed at [facility], [address] [place], [place] [address]  Troponin I (High Sensitivity)     Status: None   Collection Time: 08/30/22  6:23 PM  Result Value Ref Range   Troponin I (High Sensitivity) 11 <18 ng/L    Comment: (NOTE) Elevated high sensitivity troponin I (hsTnI) values and significant  changes across serial measurements may suggest ACS but many other  chronic and acute conditions are known to elevate hsTnI results.  Refer to the "Links" section for  chest pain algorithms and additional  guidance. Performed at [facility], [address], [place], [place] [address]   Comprehensive metabolic panel     Status: Abnormal   Collection Time: 08/30/22  6:23 PM  Result Value Ref Range   Sodium 137 135 - 145 mmol/L   Potassium 2.9 (L) 3.5 - 5.1 mmol/L   Chloride 101 98 - 111 mmol/L   CO2 26 22 - 32 mmol/L   Glucose, Bld 142 (H) 70 - 99 mg/dL    Comment: Glucose reference range applies only to samples taken after fasting for at least 8 hours.   BUN 16 8 - 23 mg/dL   Creatinine, Ser 9.62 0.61 - 1.24 mg/dL   Calcium 9.3 8.9 - 95.2 mg/dL   Total Protein 6.9 6.5 - 8.1 g/dL   Albumin 3.5 3.5 - 5.0 g/dL  AST 20 15 - 41 U/L   ALT 23 0 - 44 U/L   Alkaline Phosphatase 71 38 - 126 U/L   Total Bilirubin 0.7 0.3 - 1.2 mg/dL   GFR, Estimated >16 >10 mL/min    Comment: (NOTE) Calculated using the CKD-EPI Creatinine Equation (2021)    Anion gap 10 5 - 15    Comment: Performed at [facility], [address], [place], [place] [address]  CBC with Differential     Status: Abnormal   Collection Time: 08/30/22  6:23 PM  Result Value Ref Range   WBC 9.0 4.0 - 10.5 K/uL   RBC 4.42 4.22 - 5.81 MIL/uL   Hemoglobin 15.2 13.0 - 17.0 g/dL   HCT 40.9 81.1 - 91.4 %   MCV 99.3 80.0 - 100.0 fL   MCH 34.4 (H) 26.0 - 34.0 pg   MCHC 34.6 30.0 - 36.0 g/dL   RDW 78.2 95.6 - 21.3 %   Platelets 222 150 - 400 K/uL   nRBC 0.0 0.0 - 0.2 %   Neutrophils Relative % 58 %   Neutro Abs 5.2 1.7 - 7.7 K/uL   Lymphocytes Relative 27 %   Lymphs Abs 2.4 0.7 - 4.0 K/uL   Monocytes Relative 14 %   Monocytes Absolute 1.3 (H) 0.1 - 1.0 K/uL   Eosinophils Relative 1 %   Eosinophils Absolute 0.1 0.0 - 0.5 K/uL   Basophils Relative 0 %   Basophils Absolute 0.0 0.0 - 0.1 K/uL   Immature Granulocytes 0 %   Abs Immature Granulocytes 0.04 0.00 - 0.07 K/uL    Comment: Performed at [facility], [address], [place], [place] [address]  Protime-INR     Status: None   Collection  Time: 08/30/22  6:23 PM  Result Value Ref Range   Prothrombin Time 12.5 11.4 - 15.2 seconds   INR 0.9 0.8 - 1.2    Comment: (NOTE) INR goal varies based on device and disease states. Performed at [facility], [address], [place], [place] [address]   APTT     Status: None   Collection Time: 08/30/22  6:23 PM  Result Value Ref Range   aPTT 26 24 - 36 seconds    Comment: Performed at [facility], [address] [place], [place] [address]  Blood gas, venous     Status: Abnormal   Collection Time: 08/30/22  6:30 PM  Result Value Ref Range   pH, Ven 7.46 (H) 7.25 - 7.43   pCO2, Ven 45 44 - 60 mmHg   pO2, Ven 35 32 - 45 mmHg   Bicarbonate 32.0 (H) 20.0 - 28.0 mmol/L   Acid-Base Excess 7.3 (H) 0.0 - 2.0 mmol/L   O2 Saturation 58.7 %   Patient temperature 37.4    Collection site RIGHT ANTECUBITAL    Drawn by [id number]     Comment: Performed at [facility], [address], [place], [place] [address]  Ethanol     Status: None   Collection Time: 08/30/22  6:31 PM  Result Value Ref Range   Alcohol, Ethyl (B) <10 <10 mg/dL    Comment: (NOTE) Lowest detectable limit for serum alcohol is 10 mg/dL.  For medical purposes only. Performed at [facility], [address], [place], [place] [address]   Rapid urine drug screen (hospital performed)     Status: None   Collection Time: 08/30/22  8:04 PM  Result Value Ref Range   Opiates NONE DETECTED NONE DETECTED   Cocaine  NONE DETECTED NONE DETECTED   Benzodiazepines NONE DETECTED NONE DETECTED   Amphetamines NONE DETECTED NONE DETECTED   Tetrahydrocannabinol NONE DETECTED NONE DETECTED   Barbiturates NONE DETECTED NONE DETECTED    Comment: (NOTE) DRUG SCREEN FOR MEDICAL PURPOSES ONLY.  IF CONFIRMATION IS NEEDED FOR ANY PURPOSE, NOTIFY LAB WITHIN 5 DAYS.  LOWEST DETECTABLE LIMITS FOR URINE DRUG SCREEN Drug Class                     Cutoff (ng/mL) Amphetamine and metabolites    1000 Barbiturate and metabolites    200 Benzodiazepine                  200 Opiates and metabolites        300 Cocaine and metabolites        300 THC                            50 Performed at [facility], [address], [place], [place] [address]   Urinalysis, Routine w reflex microscopic -Urine, Clean Catch     Status: Abnormal   Collection Time: 08/30/22  8:04 PM  Result Value Ref Range   Color, Urine YELLOW YELLOW   APPearance CLEAR CLEAR   Specific Gravity, Urine 1.027 1.005 - 1.030   pH 6.0 5.0 - 8.0   Glucose, UA 50 (A) NEGATIVE mg/dL   Hgb urine dipstick NEGATIVE NEGATIVE   Bilirubin Urine NEGATIVE NEGATIVE   Ketones, ur NEGATIVE NEGATIVE mg/dL   Protein, ur 30 (A) NEGATIVE mg/dL   Nitrite NEGATIVE NEGATIVE   Leukocytes,Ua NEGATIVE NEGATIVE   RBC / HPF 0-5 0 - 5 RBC/hpf   WBC, UA 0-5 0 - 5 WBC/hpf   Bacteria, UA NONE SEEN NONE SEEN   Squamous Epithelial / HPF 0-5 0 - 5 /HPF   Mucus PRESENT     Comment: Performed at [facility], [address], [place], [place] [address]  Troponin I (High Sensitivity)     Status: None   Collection Time: 08/30/22  8:14 PM  Result Value Ref Range   Troponin I (High Sensitivity) 13 <18 ng/L    Comment: (NOTE) Elevated high sensitivity troponin I (hsTnI) values and significant  changes across serial measurements may suggest ACS but many other  chronic and acute conditions are known to elevate hsTnI results.  Refer to the "Links" section for chest pain algorithms and additional  guidance. Performed at [facility], [address] [place], [place] [address]    CT HEAD WO CONTRAST ( )  Result Date: 08/31/2022 CLINICAL DATA:  Neuro deficit, acute, stroke suspected EXAM: CT HEAD WITHOUT CONTRAST TECHNIQUE: Contiguous axial images were obtained from the base of the skull through the vertex without intravenous contrast. RADIATION DOSE REDUCTION: This exam was performed according to the departmental dose-optimization program which includes automated exposure control, adjustment of the mA and/or kV  according to patient size and/or use of iterative reconstruction technique. COMPARISON:  08/30/2022 FINDINGS: Brain: Again seen is the left frontal intracerebral hematoma measuring 3.9 x 3.5 x 2.2 cm (15 mL) compared to 11 mL previously. Parafalcine subdural and subarachnoid blood again noted, stable. Small amount of blood layering in the left lateral ventricle occipital bowl worn, stable. No midline shift. Vascular: No hyperdense vessel or unexpected calcification. Skull: No acute calvarial abnormality. Sinuses/Orbits: No acute findings Other: None IMPRESSION: Left frontal intracerebral hemorrhage slightly increased in size now 15 mL in volume compared  to 11 mL previously. Parafalcine subarachnoid and subdural blood as well as blood layering in the occipital horn of the left lateral ventricle stable since prior study. Electronically Signed   By: Charlett Nose M.D.   On: 08/31/2022 01:50   CT VENOGRAM HEAD  Result Date: 08/30/2022 CLINICAL DATA:  Sudden severe headache EXAM: CT VENOGRAM HEAD TECHNIQUE: Venographic phase images of the brain were obtained following the administration of intravenous contrast. Multiplanar reformats and maximum intensity projections were generated. RADIATION DOSE REDUCTION: This exam was performed according to the departmental dose-optimization program which includes automated exposure control, adjustment of the mA and/or kV according to patient size and/or use of iterative reconstruction technique. CONTRAST:  75mL OMNIPAQUE IOHEXOL 350 MG/ML SOLN COMPARISON:  None Available. FINDINGS: Superior sagittal sinus: Normal. Straight sinus: Normal. Inferior sagittal sinus, vein of Galen and internal cerebral veins: Normal. Transverse sinuses: Normal. Sigmoid sinuses: Normal. Visualized jugular veins: Normal. IMPRESSION: No evidence of dural venous sinus thrombosis. Electronically Signed   By: Deatra Robinson M.D.   On: 08/30/2022 19:54   CT ANGIO HEAD NECK W WO CM  Result Date:  08/30/2022 CLINICAL DATA:  Headache and altered mental status EXAM: CT ANGIOGRAPHY HEAD AND NECK WITH AND WITHOUT CONTRAST TECHNIQUE: Multidetector CT imaging of the head and neck was performed using the standard protocol during bolus administration of intravenous contrast. Multiplanar CT image reconstructions and MIPs were obtained to evaluate the vascular anatomy. Carotid stenosis measurements (when applicable) are obtained utilizing NASCET criteria, using the distal internal carotid diameter as the denominator. RADIATION DOSE REDUCTION: This exam was performed according to the departmental dose-optimization program which includes automated exposure control, adjustment of the mA and/or kV according to patient size and/or use of iterative reconstruction technique. CONTRAST:  75mL OMNIPAQUE IOHEXOL 350 MG/ML SOLN COMPARISON:  None Available. FINDINGS: CT HEAD FINDINGS Brain: There is no mass, hemorrhage or extra-axial collection. The size and configuration of the ventricles and extra-axial CSF spaces are normal. There is no acute or chronic infarction. The brain parenchyma is normal. Skull: The visualized skull base, calvarium and extracranial soft tissues are normal. Sinuses/Orbits: No fluid levels or advanced mucosal thickening of the visualized paranasal sinuses. No mastoid or middle ear effusion. The orbits are normal. CTA NECK FINDINGS SKELETON: There is no bony spinal canal stenosis. No lytic or blastic lesion. OTHER NECK: Normal pharynx, larynx and major salivary glands. No cervical lymphadenopathy. Unremarkable thyroid gland. UPPER CHEST: No pneumothorax or pleural effusion. No nodules or masses. AORTIC ARCH: There is calcific atherosclerosis of the aortic arch. Conventional 3 vessel aortic branching pattern. RIGHT CAROTID SYSTEM: No dissection, occlusion or aneurysm. Mild atherosclerotic calcification at the carotid bifurcation without hemodynamically significant stenosis. LEFT CAROTID SYSTEM: No dissection,  occlusion or aneurysm. Mild atherosclerotic calcification at the carotid bifurcation without hemodynamically significant stenosis. VERTEBRAL ARTERIES: Left dominant configuration.There is no dissection, occlusion or flow-limiting stenosis to the skull base (V1-V3 segments). CTA HEAD FINDINGS POSTERIOR CIRCULATION: --Vertebral arteries: Normal V4 segments. --Inferior cerebellar arteries: Normal. --Basilar artery: Normal. --Superior cerebellar arteries: Normal. --Posterior cerebral arteries (PCA): Normal. ANTERIOR CIRCULATION: --Intracranial internal carotid arteries: Normal. --Anterior cerebral arteries (ACA): Normal. Both A1 segments are present. Patent anterior communicating artery (a-comm). --Middle cerebral arteries (MCA): Normal. VENOUS SINUSES: As permitted by contrast timing, patent. ANATOMIC VARIANTS: None Review of the MIP images confirms the above findings. IMPRESSION: 1. No emergent large vessel occlusion or hemodynamically significant stenosis of the head or neck. 2. Mild bilateral carotid bifurcation atherosclerosis without hemodynamically significant stenosis. 3. No  aneurysm or vascular malformation. Aortic Atherosclerosis (ICD10-I70.0). Electronically Signed   By: Deatra Robinson M.D.   On: 08/30/2022 19:48   CT Lumbar Spine Wo Contrast  Result Date: 08/30/2022 CLINICAL DATA:  Trauma EXAM: CT LUMBAR SPINE WITHOUT CONTRAST TECHNIQUE: Multidetector CT imaging of the lumbar spine was performed without intravenous contrast administration. Multiplanar CT image reconstructions were also generated. RADIATION DOSE REDUCTION: This exam was performed according to the departmental dose-optimization program which includes automated exposure control, adjustment of the mA and/or kV according to patient size and/or use of iterative reconstruction technique. COMPARISON:  None Available. FINDINGS: Segmentation: 5 lumbar type vertebrae. Alignment: Normal. Vertebrae: No acute fracture or focal pathologic process.  Paraspinal and other soft tissues: Calcific aortic atherosclerosis Disc levels: Multilevel degenerative disc disease and mild facet arthrosis without high-grade spinal canal stenosis. Narrowing of the lateral recesses at L5-S1. IMPRESSION: 1. No acute fracture or static subluxation of the lumbar spine. 2. Multilevel degenerative disc disease and mild facet arthrosis without high-grade spinal canal stenosis. Aortic Atherosclerosis (ICD10-I70.0). Electronically Signed   By: Deatra Robinson M.D.   On: 08/30/2022 19:07   CT Head Wo Contrast  Result Date: 08/30/2022 CLINICAL DATA:  Altered mental status EXAM: CT HEAD WITHOUT CONTRAST TECHNIQUE: Contiguous axial images were obtained from the base of the skull through the vertex without intravenous contrast. RADIATION DOSE REDUCTION: This exam was performed according to the departmental dose-optimization program which includes automated exposure control, adjustment of the mA and/or kV according to patient size and/or use of iterative reconstruction technique. COMPARISON:  07/02/2022 FINDINGS: Brain: There is mixed intra-axial and extra-axial hemorrhage of the superior cerebrum. The left frontal intraparenchymal hematoma measures 3.5 x 1.9 x 3.4 cm (11 mL). There is parafalcine subdural blood and subarachnoid blood over the right cingulate gyrus. No midline shift or hydrocephalus. Small amount of blood layering in the left lateral ventricle occipital horn. Vascular: No abnormal hyperdensity of the major intracranial arteries or dural venous sinuses. No intracranial atherosclerosis. Skull: The visualized skull base, calvarium and extracranial soft tissues are normal. Sinuses/Orbits: No fluid levels or advanced mucosal thickening of the visualized paranasal sinuses. No mastoid or middle ear effusion. The orbits are normal. IMPRESSION: 1. Mixed intra-axial and extra-axial hemorrhage of the superior cerebrum. The left frontal intraparenchymal hematoma measures 3.5 x 1.9 x 3.4  cm (11 mL). 2. Parafalcine subdural blood and subarachnoid blood over the right cingulate gyrus. 3. Small amount of blood layering in the left lateral ventricle occipital horn. No midline shift or hydrocephalus. Critical Value/emergent results were called by telephone at the time of interpretation on 08/30/2022 at 7:02 pm to Dr. Hyacinth Meeker, who verbally acknowledged these results. Electronically Signed   By: Deatra Robinson M.D.   On: 08/30/2022 19:02   DG Chest Port 1 View  Result Date: 08/30/2022 CLINICAL DATA:  Altered EXAM: PORTABLE CHEST 1 VIEW COMPARISON:  Chest x-ray dated Jul 02, 2022 FINDINGS: The heart size and mediastinal contours are within normal limits. Both lungs are clear. The visualized skeletal structures are unremarkable. IMPRESSION: No active disease. Electronically Signed   By: Allegra Lai M.D.   On: 08/30/2022 18:29    Pending Labs Unresulted Labs (From admission, onward)     Start     Ordered   08/31/22 0136  Hemoglobin A1c  Once,   R        08/31/22 0135   08/31/22 0136  Lipid panel  Once,   R        08/31/22 0135  Vitals/Pain Today's Vitals   08/30/22 2100 08/30/22 2130 08/30/22 2142 08/31/22 0128  BP: 129/86 126/87 134/89   Pulse: 67 63 63   Resp: 19 19 18    Temp:    98.3 F (36.8 C)  TempSrc:    Oral  SpO2: 95% 96% 96%   Weight:      Height:      PainSc:        Isolation Precautions No active isolations  Medications Medications  labetalol (NORMODYNE) injection 10 mg (has no administration in time range)  potassium chloride 10 mEq in 100 mL IVPB (0 mEq Intravenous Stopped 08/30/22 2104)  iohexol (OMNIPAQUE) 350 MG/ML injection 75 mL (75 mLs Intravenous Contrast Given 08/30/22 1919)  levETIRAcetam (KEPPRA) IVPB 1000 mg/100 mL premix (0 mg Intravenous Stopped 08/30/22 2000)  0.9 %  sodium chloride infusion (0 mLs Intravenous Stopped 08/30/22 2104)    Mobility walks with person assist     Focused Assessments Neuro Assessment Handoff:  Swallow  screen pass?    Cardiac Rhythm: Normal sinus rhythm       Neuro Assessment: Exceptions to WDL Neuro Checks:      Has TPA been given? No If patient is a Neuro Trauma and patient is going to OR before floor call report to 4N Charge nurse: [phone] or [phone]   R Recommendations: See Admitting Provider Note  Report given to:   Additional Notes:

## 2022-08-31 NOTE — Progress Notes (Signed)
Patient is currently in CT 

## 2022-09-01 DIAGNOSIS — I619 Nontraumatic intracerebral hemorrhage, unspecified: Secondary | ICD-10-CM | POA: Diagnosis not present

## 2022-09-01 DIAGNOSIS — S06360A Traumatic hemorrhage of cerebrum, unspecified, without loss of consciousness, initial encounter: Secondary | ICD-10-CM | POA: Diagnosis not present

## 2022-09-01 LAB — CBC
HCT: 43 % (ref 39.0–52.0)
Hemoglobin: 14.9 g/dL (ref 13.0–17.0)
MCH: 34.7 pg — ABNORMAL HIGH (ref 26.0–34.0)
MCHC: 34.7 g/dL (ref 30.0–36.0)
MCV: 100 fL (ref 80.0–100.0)
Platelets: 240 10*3/uL (ref 150–400)
RBC: 4.3 MIL/uL (ref 4.22–5.81)
RDW: 14.2 % (ref 11.5–15.5)
WBC: 9.9 10*3/uL (ref 4.0–10.5)
nRBC: 0 % (ref 0.0–0.2)

## 2022-09-01 LAB — BASIC METABOLIC PANEL
Anion gap: 7 (ref 5–15)
BUN: 13 mg/dL (ref 8–23)
CO2: 28 mmol/L (ref 22–32)
Calcium: 9 mg/dL (ref 8.9–10.3)
Chloride: 100 mmol/L (ref 98–111)
Creatinine, Ser: 0.94 mg/dL (ref 0.61–1.24)
GFR, Estimated: >60 mL/min (ref >60)
Glucose, Bld: 118 mg/dL — ABNORMAL HIGH (ref 70–99)
Potassium: 3.7 mmol/L (ref 3.5–5.1)
Sodium: 135 mmol/L (ref 135–145)

## 2022-09-01 LAB — MAGNESIUM: Magnesium: 1.7 mg/dL (ref 1.7–2.4)

## 2022-09-01 MED ORDER — FOLIC ACID 1 MG PO TABS: 1.0000 mg | ORAL_TABLET | Freq: Every day | ORAL | 0 refills | Status: DC

## 2022-09-01 MED ORDER — VITAMIN B-1 100 MG PO TABS: 100.0000 mg | ORAL_TABLET | Freq: Every day | ORAL | 0 refills | Status: DC

## 2022-09-01 MED ORDER — STROKE: EARLY STAGES OF RECOVERY BOOK
Freq: Once | Status: DC
  Filled 2022-09-01: qty 1
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
## 2022-09-01 NOTE — Progress Notes (Signed)
Physical Therapy Treatment Patient Details Name: Connor Baker MRN: [id number] DOB: February 24, 1961 Today's Date: 09/01/2022   History of Present Illness 62 y.o. male presented to [facility] ED 08/30/22 for evaluation of confusion and right leg weakness. CT head L frontal intraparenchymal hematoma  and some arachnoid and subdural hemorrhage over the right with some extension into the ventricles too.  PMH significant of CAD status post PCI, CVA, hypertension, hyperlipidemia, OSA on CPAP, subdural hematoma in May 2022, seizures    PT Comments  Session today focused on progressive mobility, stair training, and balance assessments. Pt alert, disoriented to time and with increased time for sequential orientation questions but able to identify place and situation. Pt with min G for transfers and 300' of amb using no AD, completes 3 stairs with L side hand rail and step-to pattern. Pt performs DGI in hallway with notable difficulty for change in gait speed, steps, and obstacle clearance. PT discussed rec for OP PT pending transportation availability with pt agreeing they would benefit from continued PT to facilitate improvements in balance and functional mobility. PT to d/c acute need at this time. Thank you for the consult.    Assistance Recommended at Discharge Frequent or constant Supervision/Assistance  If plan is discharge home, recommend the following:  Can travel by private vehicle    Help with stairs or ramp for entrance;Assist for transportation;Direct supervision/assist for financial management;Direct supervision/assist for medications management;Assistance with cooking/housework      Equipment Recommendations  None recommended by PT    Recommendations for Other Services       Precautions / Restrictions Precautions Precautions: Fall Restrictions Weight Bearing Restrictions: No     Mobility  Bed Mobility               General bed mobility comments: Pt OOB in recliner upon  arrival    Transfers Overall transfer level: Needs assistance Equipment used: None Transfers: Sit to/from Stand Sit to Stand: Min guard           General transfer comment: Adequate rise, no LOB observed    Ambulation/Gait Ambulation/Gait assistance: Min assist, Min guard Gait Distance (Feet): 300 Feet Assistive device: None Gait Pattern/deviations: Step-through pattern Gait velocity: reduced Gait velocity interpretation: 1.31 - 2.62 ft/sec, indicative of limited community ambulator       Stairs Stairs: Yes Stairs assistance: Min guard Stair Management: One rail Left, Step to pattern Number of Stairs: 3 General stair comments: Ascends and descends 3 steps with step-to pattern and L hand rail.   Wheelchair Mobility     Tilt Bed    Modified Rankin (Stroke Patients Only) Modified Rankin (Stroke Patients Only) Pre-Morbid Rankin Score: Slight disability Modified Rankin: Moderately severe disability     Balance Overall balance assessment: Needs assistance Sitting-balance support: No upper extremity supported, Feet supported Sitting balance-Leahy Scale: Good     Standing balance support: No upper extremity supported Standing balance-Leahy Scale: Good                   Standardized Balance Assessment Standardized Balance Assessment : Dynamic Gait Index   Dynamic Gait Index Level Surface: Normal Change in Gait Speed: Moderate Impairment Gait with Horizontal Head Turns: Mild Impairment Gait with Vertical Head Turns: Mild Impairment Gait and Pivot Turn: Normal Step Over Obstacle: Mild Impairment Step Around Obstacles: Mild Impairment Steps: Moderate Impairment Total Score: 16      Cognition Arousal/Alertness: Awake/alert Behavior During Therapy: Flat affect Overall Cognitive Status: Impaired/Different from baseline Area of  Impairment: Orientation, Attention, Memory, Safety/judgement, Awareness, Problem solving                 Orientation  Level: Disoriented to, Time Current Attention Level: Sustained Memory: Decreased short-term memory   Safety/Judgement: Decreased awareness of safety, Decreased awareness of deficits Awareness: Intellectual Problem Solving: Slow processing, Decreased initiation, Requires verbal cues General Comments: Disoriented to time. Increased time for answering all orientation questions, able to identify location and situation with 3 options provided for answer choices. Sparse with words. Able to recall room location but difficulty with number, identifies colors and objects during amb.        Exercises      General Comments General comments (skin integrity, edema, etc.): VSS on RA      Pertinent Vitals/Pain Pain Assessment Pain Assessment: No/denies pain Pain Intervention(s): Monitored during session    Home Living                          Prior Function            PT Goals (current goals can now be found in the care plan section) Acute Rehab PT Goals Patient Stated Goal: none at this time PT Goal Formulation: Patient unable to participate in goal setting Time For Goal Achievement: 09/14/22 Progress towards PT goals: Goals met/education completed, patient discharged from PT    Frequency    Min 3X/week      PT Plan Discharge plan needs to be updated    Co-evaluation              AM-PAC PT "6 Clicks" Mobility   Outcome Measure  Help needed turning from your back to your side while in a flat bed without using bedrails?: None Help needed moving from lying on your back to sitting on the side of a flat bed without using bedrails?: None Help needed moving to and from a bed to a chair (including a wheelchair)?: A Little Help needed standing up from a chair using your arms (e.g., wheelchair or bedside chair)?: A Little Help needed to walk in hospital room?: A Little Help needed climbing 3-5 steps with a railing? : A Little 6 Click Score: 20    End of Session  Equipment Utilized During Treatment: Gait belt Activity Tolerance: Patient tolerated treatment well Patient left: in chair;with call bell/phone within reach;with chair alarm set Nurse Communication: Mobility status PT Visit Diagnosis: Muscle weakness (generalized) (M62.81);Difficulty in walking, not elsewhere classified (R26.2)     Time: 1610-9604 PT Time Calculation (min) (ACUTE ONLY): 19 min  Charges:    $Therapeutic Activity: 8-22 mins PT General Charges $$ ACUTE PT VISIT: 1 Visit                     Hendricks Milo, SPT  Acute Rehabilitation Services    Hendricks Milo 09/01/2022, 5:17 PM
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
## 2022-09-02 DIAGNOSIS — S06360A Traumatic hemorrhage of cerebrum, unspecified, without loss of consciousness, initial encounter: Secondary | ICD-10-CM | POA: Diagnosis not present

## 2022-09-02 MED ORDER — FOLIC ACID 1 MG PO TABS: 1.0000 mg | ORAL_TABLET | Freq: Every day | ORAL | 0 refills | Status: AC

## 2022-09-02 MED ORDER — VITAMIN B-1 100 MG PO TABS: 100.0000 mg | ORAL_TABLET | Freq: Every day | ORAL | 0 refills | Status: AC

## 2022-09-02 NOTE — Progress Notes (Signed)
Patient seen and evaluated this morning with no new concerns or overnight events noted.  Please refer to discharge summary dictated 7/8 for full details.  Total care time: 15 minutes.
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
## 2022-09-17 ENCOUNTER — Inpatient Hospital Stay: Payer: 59 | Admitting: Neurology

## 2023-01-19 ENCOUNTER — Inpatient Hospital Stay: Payer: 59 | Admitting: Neurology

## 2023-01-19 ENCOUNTER — Encounter: Payer: Self-pay | Admitting: Neurology

## 2023-02-06 ENCOUNTER — Emergency Department (HOSPITAL_COMMUNITY)
Admission: EM | Admit: 2023-02-06 | Discharge: 2023-02-07 | Disposition: A | Discharge status: Home / Self Care | Payer: 59 | Attending: Emergency Medicine | Admitting: Emergency Medicine

## 2023-02-06 ENCOUNTER — Emergency Department (HOSPITAL_COMMUNITY): Payer: 59

## 2023-02-06 ENCOUNTER — Encounter (HOSPITAL_COMMUNITY): Payer: Self-pay | Admitting: Emergency Medicine

## 2023-02-06 ENCOUNTER — Other Ambulatory Visit: Payer: Self-pay

## 2023-02-06 DIAGNOSIS — R109 Unspecified abdominal pain: Secondary | ICD-10-CM | POA: Diagnosis not present

## 2023-02-06 DIAGNOSIS — F17200 Nicotine dependence, unspecified, uncomplicated: Secondary | ICD-10-CM | POA: Insufficient documentation

## 2023-02-06 DIAGNOSIS — R079 Chest pain, unspecified: Secondary | ICD-10-CM | POA: Diagnosis not present

## 2023-02-06 DIAGNOSIS — R072 Precordial pain: Secondary | ICD-10-CM | POA: Diagnosis not present

## 2023-02-06 LAB — TROPONIN I (HIGH SENSITIVITY)
Troponin I (High Sensitivity): 6 ng/L (ref <18)
Troponin I (High Sensitivity): 5 ng/L (ref <18)

## 2023-02-06 LAB — CBC
HCT: 46.3 % (ref 39.0–52.0)
Hemoglobin: 16 g/dL (ref 13.0–17.0)
MCH: 34.3 pg — ABNORMAL HIGH (ref 26.0–34.0)
MCHC: 34.6 g/dL (ref 30.0–36.0)
MCV: 99.1 fL (ref 80.0–100.0)
Platelets: 200 10*3/uL (ref 150–400)
RBC: 4.67 MIL/uL (ref 4.22–5.81)
RDW: 13.5 % (ref 11.5–15.5)
WBC: 8.3 10*3/uL (ref 4.0–10.5)
nRBC: 0 % (ref 0.0–0.2)

## 2023-02-06 LAB — BASIC METABOLIC PANEL
Anion gap: 10 (ref 5–15)
BUN: 5 mg/dL — ABNORMAL LOW (ref 8–23)
CO2: 24 mmol/L (ref 22–32)
Calcium: 9 mg/dL (ref 8.9–10.3)
Chloride: 106 mmol/L (ref 98–111)
Creatinine, Ser: 0.86 mg/dL (ref 0.61–1.24)
GFR, Estimated: >60 mL/min (ref >60)
Glucose, Bld: 104 mg/dL — ABNORMAL HIGH (ref 70–99)
Potassium: 3.5 mmol/L (ref 3.5–5.1)
Sodium: 140 mmol/L (ref 135–145)

## 2023-02-06 MED ORDER — MORPHINE SULFATE (PF) 2 MG/ML IV SOLN: 2.0000 mg | Freq: Once | INTRAVENOUS | Status: DC

## 2023-02-06 MED ORDER — ONDANSETRON HCL 4 MG/2ML IJ SOLN
4.0000 mg | Freq: Once | INTRAMUSCULAR | Status: AC
  Administered 2023-02-06: 4 mg via INTRAVENOUS
  Filled 2023-02-06: qty 2

## 2023-02-06 NOTE — ED Triage Notes (Signed)
Pt bib EMS for c/o CP x 1 hr that started while pt was "resting". Per EMs, pt with "significant cardiac hx". EMS reports pt has not had any of his cardiac meds for approximately 3 months d/t inability to afford them. EMS administered 324mg  ASA and 1 SL NTG tab en route.

## 2023-02-06 NOTE — ED Provider Notes (Addendum)
[place] EMERGENCY DEPARTMENT AT [facility] Provider Note   CSN: [id number] Arrival date & time: 02/06/23  2109     History  Chief Complaint  Patient presents with   Chest Pain    Connor Baker is a 62 y.o. male.  Patient with onset of chest pain about an hour prior to arrival.  Came in by EMS.  Still has substernal chest pain.  Nonradiating no shortness of breath no nausea vomiting.  EMS gave him 325 mg of aspirin and 1 sublingual nitro he said it helped a little bit.  Patient does not have any of those medicines at home.  Patient does have a known history of coronary disease has had stents in the past.  Past medical or significant for high cholesterol MI and [id number] a stroke in 2005 sleep apnea chronic low back pain may have had an MI in 96 as well hypertension subdural hematoma in 2022.  Patient's last cardiac catheterization was February 2024.  Patient is an everyday smoker.  Also from looking at recent ED visits.  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  Patient without any complaint of head pain at this time.  The patient some IV morphine.       Home Medications Prior to Admission medications   Medication Sig Start Date End Date Taking? Authorizing Provider  atorvastatin (LIPITOR) 80 MG tablet Take 1 tablet (80 mg total) by mouth daily. 05/16/20   Georgie Chard D, NP  cholecalciferol (VITAMIN D3) 25 MCG (1000 UNIT) tablet Take 1,000 Units by mouth daily.    [provider]  naproxen (NAPROSYN) 500 MG tablet Take 500 mg by mouth daily. 06/24/22   [provider]  nitroGLYCERIN (NITROSTAT) 0.4 MG SL tablet Place 1 tablet (0.4 mg total) under the tongue every 5 (five) minutes x 3 doses as needed for chest pain. 01/02/18   Filbert Schilder, NP  Omega-3 Fatty Acids (FISH OIL) 1000 MG CAPS Take 1,000 mg by mouth daily. 06/24/22   [provider]  pantoprazole (PROTONIX) 40 MG tablet Take 1 tablet (40  mg total) by mouth daily. 04/09/22   Rai, Delene Ruffini, MD  ranolazine (RANEXA) 500 MG 12 hr tablet Take 1 tablet (500 mg total) by mouth 2 (two) times daily. Patient taking differently: Take 500 mg by mouth daily. 11/26/20   Tilden Fossa, MD      Allergies    Patient has no known allergies.    Review of Systems   Review of Systems  Constitutional:  Negative for chills and fever.  HENT:  Negative for ear pain and sore throat.   Eyes:  Negative for pain and visual disturbance.  Respiratory:  Negative for cough and shortness of breath.   Cardiovascular:  Positive for chest pain. Negative for palpitations.  Gastrointestinal:  Negative for abdominal pain and vomiting.  Genitourinary:  Negative for dysuria and hematuria.  Musculoskeletal:  Negative for arthralgias and back pain.  Skin:  Negative for color change and rash.  Neurological:  Negative for seizures and syncope.  All other systems reviewed and are negative.   Physical Exam Updated Vital Signs BP 116/85 (BP Location: Right Arm)   Pulse 71   Temp 97.8 F (36.6 C) (Oral)   Resp 19   Ht 1.778 m (5\' 10" )   Wt 70.3 kg   SpO2 94%   BMI 22.24 kg/m  Physical Exam Vitals and nursing note reviewed.  Constitutional:  General: He is not in acute distress.    Appearance: Normal appearance. He is well-developed.  HENT:     Head: Normocephalic and atraumatic.  Eyes:     Conjunctiva/sclera: Conjunctivae normal.  Cardiovascular:     Rate and Rhythm: Normal rate and regular rhythm.     Heart sounds: No murmur heard. Pulmonary:     Effort: Pulmonary effort is normal. No respiratory distress.     Breath sounds: Normal breath sounds. No wheezing, rhonchi or rales.  Chest:     Chest wall: No tenderness.  Abdominal:     Palpations: Abdomen is soft.     Tenderness: There is no abdominal tenderness.  Musculoskeletal:        General: No swelling.     Cervical back: Normal range of motion and neck supple.     Right lower leg: No  edema.     Left lower leg: No edema.  Skin:    General: Skin is warm and dry.     Capillary Refill: Capillary refill takes less than 2 seconds.  Neurological:     General: No focal deficit present.     Mental Status: He is alert and oriented to person, place, and time.  Psychiatric:        Mood and Affect: Mood normal.     ED Results / Procedures / Treatments   Labs (all labs ordered are listed, but only abnormal results are displayed) Labs Reviewed  BASIC METABOLIC PANEL - Abnormal; Notable for the following components:      Result Value   Glucose, Bld 104 (*)    BUN 5 (*)    All other components within normal limits  CBC - Abnormal; Notable for the following components:   MCH 34.3 (*)    All other components within normal limits  TROPONIN I (HIGH SENSITIVITY)    EKG EKG Interpretation Date/Time:  Friday February 06 2023 21:12:35 EST Ventricular Rate:  72 PR Interval:  124 QRS Duration:  94 QT Interval:  394 QTC Calculation: 432 R Axis:   64  Text Interpretation: Sinus rhythm Borderline repolarization abnormality No significant change since last tracing Confirmed by Vanetta Mulders [phone]) on 02/06/2023 9:32:13 PM  Radiology No results found.  Procedures Procedures    Medications Ordered in ED Medications  morphine (PF) 2 MG/ML injection 2 mg (has no administration in time range)  ondansetron (ZOFRAN) injection 4 mg (has no administration in time range)    ED Course/ Medical Decision Making/ A&P                                 Medical Decision Making Amount and/or Complexity of Data Reviewed Labs: ordered. Radiology: ordered.  Risk Prescription drug management.   Basic metabolic panel here normal.  Renal function normal.  Electrolytes normal CBC white count 8.3 hemoglobin 16 platelets 200.  Patient's initial troponin is 6.  But since pain just started this evening will need to get delta troponin.  Portable chest x-ray pending.  EKG without any acute  changes from previous.  Chest x-ray reviewed by me no acute abnormalities.  Formal report pending.  Delta troponin pending.  Will reassess after the pain medications.   Final Clinical Impression(s) / ED Diagnoses Final diagnoses:  Precordial pain    Rx / DC Orders ED Discharge Orders     None         Vanetta Mulders, MD 02/06/23 2231  Vanetta Mulders, MD 02/06/23 2231

## 2023-02-07 NOTE — Discharge Instructions (Signed)
Take ibuprofen 600 mg every 6 hours as needed for pain.  Follow-up with primary doctor/cardiologist if symptoms persist, and return to the ER if your symptoms significantly worsen or change.

## 2023-02-07 NOTE — ED Provider Notes (Signed)
  Physical Exam  BP (!) 84/56   Pulse (!) 58   Temp 97.8 F (36.6 C) (Oral)   Resp 19   Ht 5\' 10"  (1.778 m)   Wt 70.3 kg   SpO2 96%   BMI 22.24 kg/m   Physical Exam Vitals and nursing note reviewed.  Constitutional:      Appearance: He is well-developed.  Pulmonary:     Effort: Pulmonary effort is normal.  Neurological:     Mental Status: He is alert and oriented to person, place, and time.     Procedures  Procedures  ED Course / MDM    Medical Decision Making Amount and/or Complexity of Data Reviewed Labs: ordered. Radiology: ordered.  Risk Prescription drug management.   Care assumed from Dr. Deretha Emory at shift change.  Patient with history of coronary artery disease with stent presenting with complaints of chest discomfort that began 1 hour prior to presentation.  Initial EKG and troponin both unremarkable.  Care signed out to me awaiting a delta troponin which has since returned as negative.  Upon reviewing the patient's chart, he had a heart cath performed in March of this year showing no significant changes from prior catheterization and medical management was recommended.  I feel as though patient can safely be discharged with outpatient follow-up and as needed return.  Cause of the chest pain unclear, but does not appear to be cardiac in nature.       Geoffery Lyons, MD 02/07/23 [phone]

## 2023-02-27 ENCOUNTER — Ambulatory Visit: Payer: Self-pay | Admitting: General Practice
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
## 2023-03-13 ENCOUNTER — Ambulatory Visit: Payer: Self-pay | Admitting: Professional Counselor

## 2023-05-22 ENCOUNTER — Ambulatory Visit (INDEPENDENT_AMBULATORY_CARE_PROVIDER_SITE_OTHER): Admitting: Family Medicine

## 2023-05-22 ENCOUNTER — Encounter: Payer: Self-pay | Admitting: Family Medicine

## 2023-05-22 VITALS — BP 132/82 | HR 66 | Ht 71.0 in | Wt 196.2 lb

## 2023-05-22 DIAGNOSIS — R569 Unspecified convulsions: Secondary | ICD-10-CM

## 2023-05-22 DIAGNOSIS — G479 Sleep disorder, unspecified: Secondary | ICD-10-CM

## 2023-05-22 DIAGNOSIS — Z0001 Encounter for general adult medical examination with abnormal findings: Secondary | ICD-10-CM

## 2023-05-22 DIAGNOSIS — I25110 Atherosclerotic heart disease of native coronary artery with unstable angina pectoris: Secondary | ICD-10-CM

## 2023-05-22 DIAGNOSIS — E559 Vitamin D deficiency, unspecified: Secondary | ICD-10-CM

## 2023-05-22 DIAGNOSIS — G40909 Epilepsy, unspecified, not intractable, without status epilepticus: Secondary | ICD-10-CM

## 2023-05-22 DIAGNOSIS — E038 Other specified hypothyroidism: Secondary | ICD-10-CM | POA: Diagnosis not present

## 2023-05-22 DIAGNOSIS — Z136 Encounter for screening for cardiovascular disorders: Secondary | ICD-10-CM

## 2023-05-22 DIAGNOSIS — Z1159 Encounter for screening for other viral diseases: Secondary | ICD-10-CM

## 2023-05-22 DIAGNOSIS — R0602 Shortness of breath: Secondary | ICD-10-CM

## 2023-05-22 DIAGNOSIS — Z1211 Encounter for screening for malignant neoplasm of colon: Secondary | ICD-10-CM

## 2023-05-22 DIAGNOSIS — R7301 Impaired fasting glucose: Secondary | ICD-10-CM

## 2023-05-22 MED ORDER — PANTOPRAZOLE SODIUM 40 MG PO TBEC: 40.0000 mg | DELAYED_RELEASE_TABLET | Freq: Every day | ORAL | 1 refills | Status: DC

## 2023-05-22 MED ORDER — NITROGLYCERIN 0.4 MG SL SUBL: 0.4000 mg | SUBLINGUAL_TABLET | SUBLINGUAL | 0 refills | Status: DC | PRN

## 2023-05-22 MED ORDER — TRAZODONE HCL 50 MG PO TABS: 50.0000 mg | ORAL_TABLET | Freq: Every evening | ORAL | 3 refills | Status: DC

## 2023-05-22 NOTE — Assessment & Plan Note (Signed)
 Referral placed to Cardiology to establish care

## 2023-05-22 NOTE — Progress Notes (Signed)
 Complete physical exam  Patient: Connor Baker   DOB: 01-14-1961   63 y.o. Male  MRN: [id number]  Subjective:    Chief Complaint  Patient presents with   Establish Care    Pt. States " Just wants to talk about medication and his heart issues"    Connor Baker is a 63 y.o. male who presents today for a complete physical exam. He reports consuming a general diet.  Patient reports walking 2-3 miles per day  He generally feels well. He reports sleeping poorly 2-3 hours per night. He does have additional problems to discuss today.    Most recent fall risk assessment:    05/22/2023   10:33 AM  Fall Risk   Falls in the past year? 1  Number falls in past yr: 0  Injury with Fall? 1  Risk for fall due to : History of fall(s)  Follow up Falls evaluation completed;Education provided;Falls prevention discussed     Most recent depression screenings:    05/22/2023   10:33 AM 10/09/2017    3:01 PM  PHQ 2/9 Scores  PHQ - 2 Score 1 0  PHQ- 9 Score 7     Vision:Not within last year  and Dental: No current dental problems and No regular dental care   Patient Care Team: Patient, No Pcp Per as PCP - General (General Practice) Kathleene Hazel, MD as PCP - Cardiology (Cardiology)   Outpatient Medications Prior to Visit  Medication Sig Note   atorvastatin (LIPITOR) 80 MG tablet Take 1 tablet (80 mg total) by mouth daily. 05/22/2023: Pt "out of mediaction needs refill"   cholecalciferol (VITAMIN D3) 25 MCG (1000 UNIT) tablet Take 1,000 Units by mouth daily. 05/22/2023: Pt "Needs re filled"   naproxen (NAPROSYN) 500 MG tablet Take 500 mg by mouth daily. 05/22/2023: Pt "Needs refilled"   Omega-3 Fatty Acids (FISH OIL) 1000 MG CAPS Take 1,000 mg by mouth daily. 05/22/2023: Pt "Needs refilled"   ranolazine (RANEXA) 500 MG 12 hr tablet Take 1 tablet (500 mg total) by mouth 2 (two) times daily. (Patient taking differently: Take 500 mg by mouth daily.) 05/22/2023: PT "Needs refilled"    [DISCONTINUED] nitroGLYCERIN (NITROSTAT) 0.4 MG SL tablet Place 1 tablet (0.4 mg total) under the tongue every 5 (five) minutes x 3 doses as needed for chest pain. 05/22/2023: PT "Needs refilled"   [DISCONTINUED] pantoprazole (PROTONIX) 40 MG tablet Take 1 tablet (40 mg total) by mouth daily. 05/22/2023: Pt "Needs Refilled"   No facility-administered medications prior to visit.    Review of Systems  Constitutional:  Negative for chills and fever.  Eyes:  Negative for blurred vision.  Respiratory:  Negative for shortness of breath.   Cardiovascular:  Negative for chest pain.  Gastrointestinal:  Negative for abdominal pain.  Genitourinary:  Negative for dysuria.  Neurological:  Negative for dizziness and headaches.       Objective:    BP 132/82   Pulse 66   Ht 5\' 11"  (1.803 m)   Wt 196 lb 3.2 oz (89 kg)   SpO2 98%   BMI 27.36 kg/m  BP Readings from Last 3 Encounters:  05/22/23 132/82  02/07/23 (!) 80/53  09/02/22 126/86      Physical Exam Vitals reviewed.  Constitutional:      General: He is not in acute distress.    Appearance: Normal appearance. He is not ill-appearing, toxic-appearing or diaphoretic.  HENT:     Head: Normocephalic.  Right Ear: Tympanic membrane normal.     Left Ear: Tympanic membrane normal.     Mouth/Throat:     Mouth: Mucous membranes are moist.  Eyes:     General:        Right eye: No discharge.        Left eye: No discharge.     Conjunctiva/sclera: Conjunctivae normal.     Pupils: Pupils are equal, round, and reactive to light.  Cardiovascular:     Rate and Rhythm: Normal rate.     Pulses: Normal pulses.     Heart sounds: Normal heart sounds.  Pulmonary:     Effort: Pulmonary effort is normal. No respiratory distress.     Breath sounds: Normal breath sounds.  Abdominal:     General: Bowel sounds are normal.     Palpations: Abdomen is soft.     Tenderness: There is no abdominal tenderness. There is no right CVA tenderness, left CVA  tenderness or guarding.  Musculoskeletal:        General: Normal range of motion.  Skin:    General: Skin is warm and dry.     Capillary Refill: Capillary refill takes less than 2 seconds.  Neurological:     General: No focal deficit present.     Mental Status: He is alert and oriented to person, place, and time.     Coordination: Coordination normal.     Gait: Gait normal.  Psychiatric:        Mood and Affect: Mood normal.        Behavior: Behavior normal.      No results found for any visits on 05/22/23.    Assessment & Plan:    Routine Health Maintenance and Physical Exam  Immunization History  Administered Date(s) Administered   Influenza,inj,Quad PF,6+ Mos 01/05/2018    Health Maintenance  Topic Date Due   Pneumococcal Vaccine 77-2 Years old (1 of 2 - PCV) Never done   Hepatitis C Screening  Never done   DTaP/Tdap/Td (1 - Tdap) Never done   Colonoscopy  Never done   Zoster Vaccines- Shingrix (1 of 2) Never done   INFLUENZA VACCINE  09/25/2022   COVID-19 Vaccine (2 - 2024-25 season) 10/26/2022   HIV Screening  Completed   HPV VACCINES  Aged Out    Discussed health benefits of physical activity, and encouraged him to engage in regular exercise appropriate for his age and condition.  SOB (shortness of breath) -     Brain natriuretic peptide  Need for hepatitis C screening test -     Hepatitis C antibody  Vitamin D deficiency -     VITAMIN D 25 Hydroxy (Vit-D Deficiency, Fractures)  TSH (thyroid-stimulating hormone deficiency) -     TSH + free T4  Encounter for screening for cardiovascular disorders -     Lipid panel -     CMP14+EGFR -     CBC with Differential/Platelet -     Ambulatory referral to Cardiology  IFG (impaired fasting glucose) -     Hemoglobin A1c  Screening for colon cancer -     Cologuard  Seizures (HCC) -     Ambulatory referral to Neurology  Encounter for routine adult physical exam with abnormal findings Assessment & Plan: A  comprehensive physical examination was completed, and necessary labs were ordered. Screening and health maintenance recommendations have been updated. The patient received counseling on exercise and nutrition. BMI was assessed and discussed Advise for heart health, focus on:  Eat more fruits and vegetables: Aim for a variety of colors. Choose whole grains: Brown rice, oats, and whole-wheat bread. Limit unhealthy fats: Avoid trans fats; use olive or avocado oil instead. Include lean proteins: Opt for fish, chicken, beans, and legumes. Reduce sodium: Limit processed foods and add less salt. Stay hydrated: Drink plenty of water. Exercise regularly: Aim for at least 30 minutes of moderate exercise, like walking or cycling, 5 days a week.     Coronary artery disease involving native coronary artery of native heart with unstable angina pectoris [facility]) Assessment & Plan: Referral placed to Cardiology to establish care   Seizure disorder [facility]) Assessment & Plan: Patient reports having 3 seizures per month  Currently not taking any medication Referral placed to Neurology to establish care   Other orders -     Pantoprazole Sodium; Take 1 tablet (40 mg total) by mouth daily.  Dispense: 30 tablet; Refill: 1 -     Nitroglycerin; Place 1 tablet (0.4 mg total) under the tongue every 5 (five) minutes x 3 doses as needed for chest pain.  Dispense: 25 tablet; Refill: 0 -     traZODone HCl; Take 1 tablet (50 mg total) by mouth at bedtime.  Dispense: 30 tablet; Refill: 3    Return in about 3 months (around 08/22/2023), or if symptoms worsen or fail to improve, for chronic follow-up.     Cruzita Lederer Newman Nip, FNP

## 2023-05-22 NOTE — Progress Notes (Deleted)
   New Patient Office Visit   Subjective   Patient ID: Connor Baker, male    DOB: 1960/03/21  Age: 63 y.o. MRN: [id number]  CC:  Chief Complaint  Patient presents with   Establish Care    Pt. States " Just wants to talk about medication and his heart issues"    HPI Joann D Belkin presents to establish care. He  has a past medical history of Arthritis, CAD (coronary artery disease), native coronary artery (08/28/2016), Chronic lower back pain, CVA (cerebral vascular accident) (HCC), Depression, GERD (gastroesophageal reflux disease), High cholesterol, History of seizures (04/05/2022), Hypertension, Hypertension, essential, MI (myocardial infarction) (HCC) (1992; 1994; 1995), MI (myocardial infarction) (HCC), On home oxygen therapy, OSA (obstructive sleep apnea), Stroke (HCC) (2005), and Subdural hematoma (HCC) (07/21/2020).  HPI    Outpatient Encounter Medications as of 05/22/2023  Medication Sig   atorvastatin (LIPITOR) 80 MG tablet Take 1 tablet (80 mg total) by mouth daily.   cholecalciferol (VITAMIN D3) 25 MCG (1000 UNIT) tablet Take 1,000 Units by mouth daily.   naproxen (NAPROSYN) 500 MG tablet Take 500 mg by mouth daily.   nitroGLYCERIN (NITROSTAT) 0.4 MG SL tablet Place 1 tablet (0.4 mg total) under the tongue every 5 (five) minutes x 3 doses as needed for chest pain.   Omega-3 Fatty Acids (FISH OIL) 1000 MG CAPS Take 1,000 mg by mouth daily.   pantoprazole (PROTONIX) 40 MG tablet Take 1 tablet (40 mg total) by mouth daily.   ranolazine (RANEXA) 500 MG 12 hr tablet Take 1 tablet (500 mg total) by mouth 2 (two) times daily. (Patient taking differently: Take 500 mg by mouth daily.)   No facility-administered encounter medications on file as of 05/22/2023.    Past Surgical History:  Procedure Laterality Date   ANKLE FRACTURE SURGERY Right 2016   CORONARY ANGIOPLASTY     CORONARY ANGIOPLASTY WITH STENT PLACEMENT  [phone] - 2017   "I've got a total of 16 stents in me" (08/28/2016)    CORONARY BALLOON ANGIOPLASTY Right 08/28/2016   Procedure: Coronary Balloon Angioplasty;  Surgeon: Lyn Records, MD;  Location: [facility] INVASIVE CV LAB;  Service: Cardiovascular;  Laterality: Right;   FRACTURE SURGERY     LEFT HEART CATH AND CORONARY ANGIOGRAPHY N/A 08/28/2016   Procedure: Left Heart Cath and Coronary Angiography;  Surgeon: Lyn Records, MD;  Location: [facility] INVASIVE CV LAB;  Service: Cardiovascular;  Laterality: N/A;   LEFT HEART CATH AND CORONARY ANGIOGRAPHY N/A 10/24/2016   Procedure: LEFT HEART CATH AND CORONARY ANGIOGRAPHY;  Surgeon: [place], Peter M, MD;  Location: [facility] INVASIVE CV LAB;  Service: Cardiovascular;  Laterality: N/A;   LEFT HEART CATH AND CORONARY ANGIOGRAPHY N/A 04/07/2022   Procedure: LEFT HEART CATH AND CORONARY ANGIOGRAPHY;  Surgeon: Runell Gess, MD;  Location: [facility] INVASIVE CV LAB;  Service: Cardiovascular;  Laterality: N/A;   TOTAL HIP ARTHROPLASTY Right 1990    ROS    Objective    BP (!) 136/91 (BP Location: Left Arm, Patient Position: Sitting, Cuff Size: Large)   Pulse 66   Ht 5\' 11"  (1.803 m)   Wt 196 lb 3.2 oz (89 kg)   SpO2 98%   BMI 27.36 kg/m   Physical Exam    Assessment & Plan:  There are no diagnoses linked to this encounter.  No follow-ups on file.   Cruzita Lederer Newman Nip, FNP

## 2023-05-22 NOTE — Assessment & Plan Note (Signed)
 Patient reports having 3 seizures per month  Currently not taking any medication Referral placed to Neurology to establish care

## 2023-05-22 NOTE — Assessment & Plan Note (Signed)

## 2023-05-22 NOTE — Patient Instructions (Signed)

## 2023-05-24 LAB — CBC WITH DIFFERENTIAL/PLATELET
Basophils Absolute: 0.1 10*3/uL (ref 0.0–0.2)
Basos: 1 %
EOS (ABSOLUTE): 0.2 10*3/uL (ref 0.0–0.4)
Eos: 2 %
Hematocrit: 51.6 % — ABNORMAL HIGH (ref 37.5–51.0)
Hemoglobin: 18 g/dL — ABNORMAL HIGH (ref 13.0–17.7)
Immature Grans (Abs): 0.1 10*3/uL (ref 0.0–0.1)
Immature Granulocytes: 1 %
Lymphocytes Absolute: 2.3 10*3/uL (ref 0.7–3.1)
Lymphs: 28 %
MCH: 34.6 pg — ABNORMAL HIGH (ref 26.6–33.0)
MCHC: 34.9 g/dL (ref 31.5–35.7)
MCV: 99 fL — ABNORMAL HIGH (ref 79–97)
Monocytes Absolute: 0.8 10*3/uL (ref 0.1–0.9)
Monocytes: 9 %
Neutrophils Absolute: 4.9 10*3/uL (ref 1.4–7.0)
Neutrophils: 59 %
Platelets: 189 10*3/uL (ref 150–450)
RBC: 5.2 x10E6/uL (ref 4.14–5.80)
RDW: 12.4 % (ref 11.6–15.4)
WBC: 8.4 10*3/uL (ref 3.4–10.8)

## 2023-05-24 LAB — TSH+FREE T4
Free T4: 1.27 ng/dL (ref 0.82–1.77)
TSH: 1.71 u[IU]/mL (ref 0.450–4.500)

## 2023-05-24 LAB — CMP14+EGFR
ALT: 17 IU/L (ref 0–44)
AST: 23 IU/L (ref 0–40)
Albumin: 4.5 g/dL (ref 3.9–4.9)
Alkaline Phosphatase: 82 IU/L (ref 44–121)
BUN/Creatinine Ratio: 15 (ref 10–24)
BUN: 15 mg/dL (ref 8–27)
Bilirubin Total: 0.8 mg/dL (ref 0.0–1.2)
CO2: 26 mmol/L (ref 20–29)
Calcium: 9.2 mg/dL (ref 8.6–10.2)
Chloride: 100 mmol/L (ref 96–106)
Creatinine, Ser: 1.01 mg/dL (ref 0.76–1.27)
Globulin, Total: 2.6 g/dL (ref 1.5–4.5)
Glucose: 84 mg/dL (ref 70–99)
Potassium: 4.1 mmol/L (ref 3.5–5.2)
Sodium: 138 mmol/L (ref 134–144)
Total Protein: 7.1 g/dL (ref 6.0–8.5)
eGFR: 83 mL/min/{1.73_m2} (ref >59)

## 2023-05-24 LAB — HEPATITIS C ANTIBODY: Hep C Virus Ab: NONREACTIVE

## 2023-05-24 LAB — LIPID PANEL
Chol/HDL Ratio: 2.8 ratio (ref 0.0–5.0)
Cholesterol, Total: 189 mg/dL (ref 100–199)
HDL: 68 mg/dL (ref >39)
LDL Chol Calc (NIH): 99 mg/dL (ref 0–99)
Triglycerides: 126 mg/dL (ref 0–149)
VLDL Cholesterol Cal: 22 mg/dL (ref 5–40)

## 2023-05-24 LAB — HEMOGLOBIN A1C
Est. average glucose Bld gHb Est-mCnc: 114 mg/dL
Hgb A1c MFr Bld: 5.6 % (ref 4.8–5.6)

## 2023-05-24 LAB — VITAMIN D 25 HYDROXY (VIT D DEFICIENCY, FRACTURES): Vit D, 25-Hydroxy: 19.6 ng/mL — ABNORMAL LOW (ref 30.0–100.0)

## 2023-05-24 LAB — BRAIN NATRIURETIC PEPTIDE: BNP: 14.5 pg/mL (ref 0.0–100.0)

## 2023-05-28 NOTE — Progress Notes (Signed)
 Please inform patient,   Vitamin D levels low, I advise to taking  over the counter supplements of vitamin D 1000 IU/day to prevent low vitamin D levels.  Consume Vitamin D-Rich Foods: Fatty Fish: Salmon, mackerel, tuna, and sardines. Egg Yolks: A good source if eaten whole. Fortified Foods: Milk, orange juice, cereals, and plant-based milks (like almond or soy milk). Mushrooms: Especially those exposed to sunlight or UV light. Cod Liver Oil: A concentrated source of vitamin D. Including these foods in your diet can help boost vitamin D levels   Symptoms of Low Vitamin D:  Fatigue and low energy. Bone pain and muscle weakness. Increased risk of fractures or weak bones. Frequent illness or infections. Mood changes, like depression or anxiety. Hair loss or thinning.     All other labs normal

## 2023-06-05 ENCOUNTER — Encounter: Admitting: Internal Medicine

## 2023-06-05 NOTE — Progress Notes (Signed)
 Erroneous encounter - please disregard.

## 2023-07-28 ENCOUNTER — Ambulatory Visit: Admitting: Internal Medicine

## 2023-08-20 ENCOUNTER — Ambulatory Visit: Admitting: Neurology

## 2023-08-26 ENCOUNTER — Encounter: Payer: Self-pay | Admitting: Family Medicine

## 2023-08-26 ENCOUNTER — Ambulatory Visit: Admitting: Family Medicine

## 2023-08-26 VITALS — BP 134/82 | HR 82 | Ht 70.0 in | Wt 206.0 lb

## 2023-08-26 DIAGNOSIS — I10 Essential (primary) hypertension: Secondary | ICD-10-CM

## 2023-08-26 DIAGNOSIS — E782 Mixed hyperlipidemia: Secondary | ICD-10-CM | POA: Diagnosis not present

## 2023-08-26 DIAGNOSIS — K219 Gastro-esophageal reflux disease without esophagitis: Secondary | ICD-10-CM

## 2023-08-26 DIAGNOSIS — G479 Sleep disorder, unspecified: Secondary | ICD-10-CM | POA: Diagnosis not present

## 2023-08-26 DIAGNOSIS — F5101 Primary insomnia: Secondary | ICD-10-CM | POA: Diagnosis not present

## 2023-08-26 DIAGNOSIS — G4700 Insomnia, unspecified: Secondary | ICD-10-CM | POA: Insufficient documentation

## 2023-08-26 DIAGNOSIS — Z1211 Encounter for screening for malignant neoplasm of colon: Secondary | ICD-10-CM

## 2023-08-26 MED ORDER — PANTOPRAZOLE SODIUM 40 MG PO TBEC: 40.0000 mg | DELAYED_RELEASE_TABLET | Freq: Every day | ORAL | 3 refills | Status: DC

## 2023-08-26 MED ORDER — NAPROXEN 500 MG PO TABS: 500.0000 mg | ORAL_TABLET | Freq: Every day | ORAL | 2 refills | Status: AC | PRN

## 2023-08-26 MED ORDER — VITAMIN D 25 MCG (1000 UNIT) PO TABS: 1000.0000 [IU] | ORAL_TABLET | Freq: Every day | ORAL | 2 refills | Status: AC

## 2023-08-26 MED ORDER — TRAZODONE HCL 50 MG PO TABS
50.0000 mg | ORAL_TABLET | Freq: Every evening | ORAL | 3 refills | Status: AC
Start: 2023-08-26 — End: ?

## 2023-08-26 MED ORDER — ATORVASTATIN CALCIUM 80 MG PO TABS: 80.0000 mg | ORAL_TABLET | Freq: Every day | ORAL | 6 refills | Status: DC

## 2023-08-26 NOTE — Assessment & Plan Note (Signed)
 Previous LDL 99 Patient reports taking Lipitor  80 mg once daily Repeat Panel ordered today

## 2023-08-26 NOTE — Assessment & Plan Note (Signed)
 Controlled on Protonix  40 mg once daily Explained to patient lifestyle modifications, Eat smaller, more frequent meals, avoid trigger foods like spicy, fatty, or acidic items, and don't lie down for at least two to three hours after eating. Maintain a healthy weight, as excess weight can increase pressure on the stomach, and consider elevating the head of your bed to reduce nighttime symptoms.

## 2023-08-26 NOTE — Assessment & Plan Note (Signed)
 Controlled on Trazdone 50 mg at bedtime Explained to go to bed at the same time each night and get up at the same time each morning, including on the weekends. Make sure your bedroom is quiet, dark, relaxing, and at a comfortable temperature. Remove electronic devices, such as TVs, computers, and smart phones, from the bedroom.

## 2023-08-26 NOTE — Patient Instructions (Signed)

## 2023-08-26 NOTE — Assessment & Plan Note (Signed)
 Normotensive today. Labs ordered today Patient reports no taking any blood pressure medication for the past few years Explained to follow a DASH diet which includes vegetables,fruits,whole grains, fat free or low fat diary,fish,poultry,beans,nuts and seeds,vegetable oils. Find an activity that you will enjoy and start to be active at least 5 days a week for 30 minutes each day.
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
## 2023-08-27 ENCOUNTER — Ambulatory Visit: Payer: Self-pay | Admitting: Family Medicine

## 2023-08-27 LAB — CBC WITH DIFFERENTIAL/PLATELET
Basophils Absolute: 0.1 10*3/uL (ref 0.0–0.2)
Basos: 1 %
EOS (ABSOLUTE): 0.2 10*3/uL (ref 0.0–0.4)
Eos: 2 %
Hematocrit: 48.2 % (ref 37.5–51.0)
Hemoglobin: 16.8 g/dL (ref 13.0–17.7)
Immature Grans (Abs): 0.1 10*3/uL (ref 0.0–0.1)
Immature Granulocytes: 1 %
Lymphocytes Absolute: 2.9 10*3/uL (ref 0.7–3.1)
Lymphs: 31 %
MCH: 34.6 pg — ABNORMAL HIGH (ref 26.6–33.0)
MCHC: 34.9 g/dL (ref 31.5–35.7)
MCV: 99 fL — ABNORMAL HIGH (ref 79–97)
Monocytes Absolute: 0.8 10*3/uL (ref 0.1–0.9)
Monocytes: 8 %
Neutrophils Absolute: 5.4 10*3/uL (ref 1.4–7.0)
Neutrophils: 57 %
Platelets: 183 10*3/uL (ref 150–450)
RBC: 4.86 x10E6/uL (ref 4.14–5.80)
RDW: 12.5 % (ref 11.6–15.4)
WBC: 9.4 10*3/uL (ref 3.4–10.8)

## 2023-08-27 LAB — CMP14+EGFR
ALT: 19 IU/L (ref 0–44)
AST: 20 IU/L (ref 0–40)
Albumin: 4.3 g/dL (ref 3.9–4.9)
Alkaline Phosphatase: 75 IU/L (ref 44–121)
BUN/Creatinine Ratio: 12 (ref 10–24)
BUN: 13 mg/dL (ref 8–27)
Bilirubin Total: 0.4 mg/dL (ref 0.0–1.2)
CO2: 20 mmol/L (ref 20–29)
Calcium: 9.7 mg/dL (ref 8.6–10.2)
Chloride: 104 mmol/L (ref 96–106)
Creatinine, Ser: 1.08 mg/dL (ref 0.76–1.27)
Globulin, Total: 2.4 g/dL (ref 1.5–4.5)
Glucose: 80 mg/dL (ref 70–99)
Potassium: 4.1 mmol/L (ref 3.5–5.2)
Sodium: 141 mmol/L (ref 134–144)
Total Protein: 6.7 g/dL (ref 6.0–8.5)
eGFR: 77 mL/min/{1.73_m2} (ref >59)

## 2023-08-27 LAB — LIPID PANEL
Chol/HDL Ratio: 3.7 ratio (ref 0.0–5.0)
Cholesterol, Total: 185 mg/dL (ref 100–199)
HDL: 50 mg/dL (ref >39)
LDL Chol Calc (NIH): 92 mg/dL (ref 0–99)
Triglycerides: 260 mg/dL — ABNORMAL HIGH (ref 0–149)
VLDL Cholesterol Cal: 43 mg/dL — ABNORMAL HIGH (ref 5–40)

## 2023-08-27 NOTE — Progress Notes (Signed)
 Please inform patient:   Triglycerides levels elevated, start lifestyle modifications and follow diet low in saturated fat. I encourage over the counter omega-3 fish oil 1000 mg twice daily.   Diet tips: Eat More: Oats, beans, and lentils: High in soluble fiber. Fatty fish: Salmon, tuna (rich in omega-3s). Nuts and seeds: Almonds, walnuts, flaxseeds. Fruits and vegetables: Apples, berries, leafy greens. Healthy fats: Olive oil, avocado. Limit: Saturated fats: Butter, cream, fatty meats. Trans fats: Fried foods, processed snacks. Sugar and refined carbs: Sweets, white bread. Focus on whole foods, healthy fats, and fiber to improve heart health! Maintain an exercise routine 3 to 5 days a week for a minimum total of 150 minutes.

## 2023-09-09 ENCOUNTER — Encounter: Payer: Self-pay | Admitting: Internal Medicine

## 2023-09-09 ENCOUNTER — Ambulatory Visit: Attending: Internal Medicine | Admitting: Internal Medicine

## 2023-09-09 VITALS — BP 138/86 | HR 64 | Ht 70.0 in | Wt 198.6 lb

## 2023-09-09 DIAGNOSIS — R42 Dizziness and giddiness: Secondary | ICD-10-CM | POA: Diagnosis not present

## 2023-09-09 DIAGNOSIS — I200 Unstable angina: Secondary | ICD-10-CM | POA: Diagnosis not present

## 2023-09-09 DIAGNOSIS — R52 Pain, unspecified: Secondary | ICD-10-CM | POA: Insufficient documentation

## 2023-09-09 DIAGNOSIS — I10 Essential (primary) hypertension: Secondary | ICD-10-CM | POA: Insufficient documentation

## 2023-09-09 DIAGNOSIS — I25110 Atherosclerotic heart disease of native coronary artery with unstable angina pectoris: Secondary | ICD-10-CM | POA: Insufficient documentation

## 2023-09-09 DIAGNOSIS — I739 Peripheral vascular disease, unspecified: Secondary | ICD-10-CM | POA: Insufficient documentation

## 2023-09-09 MED ORDER — NITROGLYCERIN 0.4 MG SL SUBL: 0.4000 mg | SUBLINGUAL_TABLET | SUBLINGUAL | 3 refills | Status: AC | PRN

## 2023-09-09 MED ORDER — CLOPIDOGREL BISULFATE 75 MG PO TABS: 75.0000 mg | ORAL_TABLET | Freq: Every day | ORAL | 3 refills | Status: DC

## 2023-09-09 MED ORDER — EZETIMIBE 10 MG PO TABS: 10.0000 mg | ORAL_TABLET | Freq: Every day | ORAL | 3 refills | Status: AC

## 2023-09-09 NOTE — Patient Instructions (Addendum)
 Medication Instructions:  Your physician recommends the following medication changes.  START TAKING: Nitroglycerin  0.4 mg (under the tongue dissolving) as needed for chest pain - If a single episode of chest pain is not relieved by one tablet in 5 minutes, take another tablet and after 5 minutes if chest pain continues, take the 3rd tablet; and if this doesn't relieve the pain, call 911 for transportation to an emergency department. Zetia  - 10 mg daily Plavix  - 75 mg daily  *If you need a refill on your cardiac medications before your next appointment, please call your pharmacy*  Lab Work: None ordered at this time  If you have labs (blood work) drawn today and your tests are completely normal, you will receive your results only by: MyChart Message (if you have MyChart) OR A paper copy in the mail If you have any lab test that is abnormal or we need to change your treatment, we will call you to review the results.  Testing/Procedures: Your physician has requested that you have an echocardiogram. Echocardiography is a painless test that uses sound waves to create images of your heart. It provides your doctor with information about the size and shape of your heart and how well your heart's chambers and valves are working.   You may receive an ultrasound enhancing agent through an IV if needed to better visualize your heart during the echo. This procedure takes approximately one hour.  There are no restrictions for this procedure.  This will take place at ?[facility] and Vascular building -- [address], [place], [place] [address]  Please note: We ask at that you not bring children with you during ultrasound (echo/ vascular) testing. Due to room size and safety concerns, children are not allowed in the ultrasound rooms during exams. Our front office staff cannot provide observation of children in our lobby area while testing is being conducted. An adult accompanying a patient to  their appointment will only be allowed in the ultrasound room at the discretion of the ultrasound technician under special circumstances. We apologize for any inconvenience.  Your physician has requested that you have an ankle brachial index (ABI). During this test an ultrasound and blood pressure cuff are used to evaluate the arteries that supply the arms and legs with blood.  Allow thirty minutes for this exam.  There are no restrictions or special instructions.  This will take place inside [facility], [address] [place], [place], [place] [address]  Please note: We ask at that you not bring children with you during ultrasound (echo/ vascular) testing. Due to room size and safety concerns, children are not allowed in the ultrasound rooms during exams. Our front office staff cannot provide observation of children in our lobby area while testing is being conducted. An adult accompanying a patient to their appointment will only be allowed in the ultrasound room at the discretion of the ultrasound technician under special circumstances. We apologize for any inconvenience.  Follow-Up: At [facility], you and your health needs are our priority.  As part of our continuing mission to provide you with exceptional heart care, our providers are all part of one team.  This team includes your primary Cardiologist (physician) and Advanced Practice Providers or APPs (Physician Assistants and Nurse Practitioners) who all work together to provide you with the care you need, when you need it.  Your next appointment:   6 month(s)  Provider:   You may see Lonni Cash, MD or one  of the following Advanced Practice Providers on your designated Care Team:   [place], PA-C  [place], [place] Olivia Pavy, [place]    We recommend signing up for the patient portal called MyChart.  Sign up information is provided on this After Visit Summary.  MyChart is used to connect with patients for  Virtual Visits (Telemedicine).  Patients are able to view lab/test results, encounter notes, upcoming appointments, etc.  Non-urgent messages can be sent to your provider as well.   To learn more about what you can do with MyChart, go to [email or web address].
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
## 2023-09-24 ENCOUNTER — Encounter (INDEPENDENT_AMBULATORY_CARE_PROVIDER_SITE_OTHER): Payer: Self-pay | Admitting: *Deleted

## 2023-09-28 ENCOUNTER — Ambulatory Visit (HOSPITAL_COMMUNITY): Admission: RE | Admit: 2023-09-28 | Source: Ambulatory Visit

## 2023-11-06 ENCOUNTER — Ambulatory Visit: Admitting: Family Medicine

## 2023-11-06 ENCOUNTER — Encounter: Payer: Self-pay | Admitting: Family Medicine

## 2023-11-06 VITALS — BP 137/76 | HR 72 | Ht 70.0 in | Wt 200.0 lb

## 2023-11-06 DIAGNOSIS — E612 Magnesium deficiency: Secondary | ICD-10-CM | POA: Diagnosis not present

## 2023-11-06 DIAGNOSIS — M542 Cervicalgia: Secondary | ICD-10-CM | POA: Diagnosis not present

## 2023-11-06 DIAGNOSIS — R252 Cramp and spasm: Secondary | ICD-10-CM | POA: Diagnosis not present

## 2023-11-06 DIAGNOSIS — D509 Iron deficiency anemia, unspecified: Secondary | ICD-10-CM

## 2023-11-06 NOTE — Assessment & Plan Note (Signed)
 The patient reports neck pain with numbness and tingling radiating to the arms and fingers. Neck X-rays have been ordered,  I explained to the patient that non-pharmacological interventions include the application of ice or heat, rest, and recommended range of motion exercises along with gentle stretching. For pain management, Tylenol  was advised. The patient was instructed to follow up if symptoms worsen or persist. The patient verbalized understanding of the care plan, and all questions were answered.

## 2023-11-06 NOTE — Patient Instructions (Addendum)
        Please call to reschedule Echocardiogram   [facility] at [facility] [address] [place], [place] [address] [phone]   [place] to see you today.  I have refilled the medication(s) we provide.   If labs were collected, we will inform you of lab results once received either by echart message or telephone call.   - echart message- for normal results that have been seen by the patient already.   - telephone call: abnormal results or if patient has not viewed results in their echart.   - Please take medications as prescribed. - Follow up with your primary health provider if any health concerns arises. - If symptoms worsen please contact your primary care provider and/or visit the emergency department.

## 2023-11-06 NOTE — Progress Notes (Signed)
 Established Patient Office Visit   Subjective  Patient ID: Connor Baker, male    DOB: 1960-12-04  Age: 63 y.o. MRN: [id number]  Chief Complaint  Patient presents with   Care Management    Six month follow up    Numbness    Numbness in left arm    leg cramps    Cramping in both legs     He  has a past medical history of Arthritis, CAD (coronary artery disease), native coronary artery (08/28/2016), Chronic lower back pain, CVA (cerebral vascular accident) (HCC), Depression, GERD (gastroesophageal reflux disease), High cholesterol, History of seizures (04/05/2022), Hypertension, Hypertension, essential, MI (myocardial infarction) (HCC) (1992; 1994; 1995), MI (myocardial infarction) (HCC), On home oxygen therapy, OSA (obstructive sleep apnea), Stroke (HCC) (2005), and Subdural hematoma (HCC) (07/21/2020).  HPI Patient presents to the clinic for follow up. For the details of today's visit, please refer to assessment and plan.  Review of Systems  Constitutional:  Negative for chills and fever.  Respiratory:  Negative for shortness of breath.   Cardiovascular:  Negative for chest pain.  Musculoskeletal:  Positive for myalgias and neck pain.  Neurological:  Positive for tingling.      Objective:     BP 137/76   Pulse 72   Ht 5' 10 (1.778 m)   Wt 200 lb (90.7 kg)   SpO2 93%   BMI 28.70 kg/m  BP Readings from Last 3 Encounters:  11/06/23 137/76  09/09/23 138/86  08/26/23 134/82      Physical Exam Vitals reviewed.  Constitutional:      General: He is not in acute distress.    Appearance: Normal appearance. He is not ill-appearing, toxic-appearing or diaphoretic.  HENT:     Head: Normocephalic.  Eyes:     General:        Right eye: No discharge.        Left eye: No discharge.     Conjunctiva/sclera: Conjunctivae normal.  Cardiovascular:     Rate and Rhythm: Normal rate.     Pulses: Normal pulses.     Heart sounds: Normal heart sounds.  Pulmonary:     Effort:  Pulmonary effort is normal. No respiratory distress.     Breath sounds: Normal breath sounds.  Musculoskeletal:     Cervical back: Normal range of motion.  Neurological:     Mental Status: He is alert.  Psychiatric:        Mood and Affect: Mood normal.        Behavior: Behavior normal.      No results found for any visits on 11/06/23.  The ASCVD Risk score (Arnett DK, et al., 2019) failed to calculate for the following reasons:   Risk score cannot be calculated because patient has a medical history suggesting prior/existing ASCVD    Assessment & Plan:  Magnesium deficiency -     Magnesium  Iron deficiency anemia, unspecified iron deficiency anemia type -     Iron, TIBC and Ferritin Panel  Neck pain Assessment & Plan: The patient reports neck pain with numbness and tingling radiating to the arms and fingers. Neck X-rays have been ordered,  I explained to the patient that non-pharmacological interventions include the application of ice or heat, rest, and recommended range of motion exercises along with gentle stretching. For pain management, Tylenol  was advised. The patient was instructed to follow up if symptoms worsen or persist. The patient verbalized understanding of the care plan, and all questions were answered.  Orders: -     DG Cervical Spine Complete; Future  Muscle cramps Assessment & Plan: Labs Ordered: Magnesium level and iron panel to evaluate for possible deficiencies.     Return in about 3 months (around 02/05/2024), or if symptoms worsen or fail to improve, for chronic follow-up.   Hilario Kidd Wilhelmena Falter, FNP

## 2023-11-06 NOTE — Assessment & Plan Note (Signed)
 Labs Ordered: Magnesium level and iron panel to evaluate for possible deficiencies.

## 2023-11-07 LAB — IRON,TIBC AND FERRITIN PANEL
Ferritin: 313 ng/mL (ref 30–400)
Iron Saturation: 31 % (ref 15–55)
Iron: 87 ug/dL (ref 38–169)
Total Iron Binding Capacity: 283 ug/dL (ref 250–450)
UIBC: 196 ug/dL (ref 111–343)

## 2023-11-07 LAB — MAGNESIUM: Magnesium: 2 mg/dL (ref 1.6–2.3)

## 2023-11-11 ENCOUNTER — Ambulatory Visit: Payer: Self-pay | Admitting: Family Medicine

## 2023-11-11 NOTE — Progress Notes (Signed)
 Please inform patient Lab results came back normal

## 2023-12-04 NOTE — Progress Notes (Signed)
 Connor Baker                                          MRN: [id number]   12/04/2023   The [facility] Quality Team Specialist reviewed this patient medical record for the purposes of chart review for care gap closure. The following were reviewed: chart review for care gap closure-colorectal cancer screening.    [facility] Quality Team

## 2024-01-26 ENCOUNTER — Ambulatory Visit

## 2024-01-26 VITALS — BP 122/76 | HR 63 | Ht 70.0 in | Wt 204.0 lb

## 2024-01-26 DIAGNOSIS — I25110 Atherosclerotic heart disease of native coronary artery with unstable angina pectoris: Secondary | ICD-10-CM

## 2024-01-26 DIAGNOSIS — E782 Mixed hyperlipidemia: Secondary | ICD-10-CM

## 2024-01-26 DIAGNOSIS — K219 Gastro-esophageal reflux disease without esophagitis: Secondary | ICD-10-CM

## 2024-01-26 DIAGNOSIS — I10 Essential (primary) hypertension: Secondary | ICD-10-CM

## 2024-01-26 MED ORDER — RANOLAZINE ER 500 MG PO TB12: 500.0000 mg | ORAL_TABLET | Freq: Two times a day (BID) | ORAL | 3 refills | Status: AC

## 2024-01-26 MED ORDER — ATORVASTATIN CALCIUM 80 MG PO TABS: 80.0000 mg | ORAL_TABLET | Freq: Every day | ORAL | 3 refills | Status: AC

## 2024-01-26 MED ORDER — FAMOTIDINE 20 MG PO TABS: 20.0000 mg | ORAL_TABLET | Freq: Every day | ORAL | 3 refills | Status: AC

## 2024-01-26 MED ORDER — PANTOPRAZOLE SODIUM 40 MG PO TBEC: 40.0000 mg | DELAYED_RELEASE_TABLET | Freq: Every day | ORAL | 3 refills | Status: AC

## 2024-01-26 MED ORDER — CLOPIDOGREL BISULFATE 75 MG PO TABS: 75.0000 mg | ORAL_TABLET | Freq: Every day | ORAL | 3 refills | Status: AC

## 2024-01-26 NOTE — Progress Notes (Signed)
 Established Patient Office Visit  Subjective   Patient ID: Connor Baker, male    DOB: [birth year]  Age: 63 y.o. MRN: [id number]  Chief Complaint  Patient presents with   Medical Management of Chronic Issues    3 month follow up     HPI Discussed the use of AI scribe software for clinical note transcription with the patient, who gave verbal consent to proceed.  History of Present Illness    Connor Baker is a 63 year old male with coronary artery disease who presents for a follow-up visit.  Medication nonadherence due to financial barriers - Unable to afford medications, including Plavix , for the past month - Experiencing financial difficulties in affording medications, including Plavix , which is important for maintaining stent patency  Coronary artery disease and stent placement - History of sixteen or seventeen stents placed in the heart - Last stent procedure occurred in June or July of the previous year  Angina and chest pain - Frequent chest pain - Previously prescribed Ranexa  for angina  Peripheral neuropathy and groin pain post-stenting - Numbness in the leg and pain in the groin area associated with previous stent procedures - Symptoms resolved after a few days of rest  Gastroesophageal reflux and heartburn - Issues with heartburn and acid reflux - Previously on Protonix , unclear efficacy - Worsening symptoms of acid reflux since discontinuing all medications     Patient Active Problem List   Diagnosis Date Noted   Neck pain 11/06/2023   Muscle cramps 11/06/2023   PAD (peripheral artery disease) 09/09/2023   Dizziness 09/09/2023   Insomnia 08/26/2023   Encounter for routine adult physical exam with abnormal findings 05/22/2023   Intraparenchymal hemorrhage of brain (HCC) 08/31/2022   Hypokalemia 08/31/2022   Seizure disorder (HCC) 08/31/2022   GERD (gastroesophageal reflux disease) 04/05/2022   OSA (obstructive sleep apnea) 04/05/2022   History of  seizures 04/05/2022   Allergic rhinitis 04/05/2022   Subdural hematoma (HCC) 07/21/2020   Chest pain 05/15/2020   Unstable angina (HCC) 12/31/2017   Tobacco abuse 10/11/2017   HLD (hyperlipidemia) 08/29/2016   CAD (coronary artery disease), native coronary artery 08/28/2016   Hypertension, essential     ROS    Objective:     BP 122/76 (BP Location: Left Arm, Patient Position: Sitting, Cuff Size: Normal)   Pulse 63   Ht 5' 10 (1.778 m)   Wt 204 lb (92.5 kg)   SpO2 96%   BMI 29.27 kg/m  BP Readings from Last 3 Encounters:  01/26/24 122/76  11/06/23 137/76  09/09/23 138/86   Wt Readings from Last 3 Encounters:  01/26/24 204 lb (92.5 kg)  11/06/23 200 lb (90.7 kg)  09/09/23 198 lb 9.6 oz (90.1 kg)     Physical Exam Vitals and nursing note reviewed.  Constitutional:      Appearance: Normal appearance.  HENT:     Head: Normocephalic.  Eyes:     Extraocular Movements: Extraocular movements intact.     Pupils: Pupils are equal, round, and reactive to light.  Cardiovascular:     Rate and Rhythm: Normal rate and regular rhythm.  Pulmonary:     Effort: Pulmonary effort is normal.     Breath sounds: Normal breath sounds.  Musculoskeletal:     Cervical back: Normal range of motion and neck supple.  Neurological:     Mental Status: He is alert and oriented to person, place, and time.  Psychiatric:        Mood  and Affect: Mood normal.        Thought Content: Thought content normal.      No results found for any visits on 01/26/24.    The ASCVD Risk score (Arnett DK, et al., 2019) failed to calculate for the following reasons:   Risk score cannot be calculated because patient has a medical history suggesting prior/existing ASCVD    Assessment & Plan:   Problem List Items Addressed This Visit       Cardiovascular and Mediastinum   CAD (coronary artery disease), native coronary artery (Chronic)   Increased risk of stent thrombosis due to lack of antiplatelet  therapy. Intermittent chest pain and resolved groin numbness noted. Emphasized Plavix  importance. - Sent prescriptions for Plavix  and Ranexa  to [facility] pharmacy. - Advised obtaining a 90-day supply of medications to reduce pharmacy visits. - Referred to prescription assistance program for financial support. - Encouraged contacting cardiologist for follow-up appointment in January.      Relevant Medications   atorvastatin  (LIPITOR ) 80 MG tablet   clopidogrel  (PLAVIX ) 75 MG tablet   ranolazine  (RANEXA ) 500 MG 12 hr tablet   Hypertension, essential - Primary   Blood pressure improved to 122/76 mmHg upon recheck. Not on antihypertensive medication.      Relevant Medications   atorvastatin  (LIPITOR ) 80 MG tablet   ranolazine  (RANEXA ) 500 MG 12 hr tablet     Digestive   GERD (gastroesophageal reflux disease)   Worsening heartburn since discontinuing Protonix . - Sent prescription for Protonix  to pharmacy. - Advised taking Protonix  first thing in the morning on an empty stomach, followed by other medications an hour later. - Sent prescription for Pepcid  to be taken at bedtime.      Relevant Medications   pantoprazole  (PROTONIX ) 40 MG tablet   famotidine  (PEPCID ) 20 MG tablet     Other   HLD (hyperlipidemia) (Chronic)   Lipitor  80 mg refilled      Relevant Medications   atorvastatin  (LIPITOR ) 80 MG tablet   ranolazine  (RANEXA ) 500 MG 12 hr tablet    Return in about 6 months (around 07/26/2024) for chronic follow-up with PCP.    Leita Longs, FNP

## 2024-01-26 NOTE — Assessment & Plan Note (Signed)
 Worsening heartburn since discontinuing Protonix . - Sent prescription for Protonix  to pharmacy. - Advised taking Protonix  first thing in the morning on an empty stomach, followed by other medications an hour later. - Sent prescription for Pepcid  to be taken at bedtime.

## 2024-01-26 NOTE — Assessment & Plan Note (Signed)
 Increased risk of stent thrombosis due to lack of antiplatelet therapy. Intermittent chest pain and resolved groin numbness noted. Emphasized Plavix  importance. - Sent prescriptions for Plavix  and Ranexa  to [facility] pharmacy. - Advised obtaining a 90-day supply of medications to reduce pharmacy visits. - Referred to prescription assistance program for financial support. - Encouraged contacting cardiologist for follow-up appointment in January.

## 2024-01-26 NOTE — Assessment & Plan Note (Signed)
Lipitor 80 mg refilled.

## 2024-01-26 NOTE — Assessment & Plan Note (Addendum)
 Blood pressure improved to 122/76 mmHg upon recheck. Not on antihypertensive medication.

## 2024-02-05 ENCOUNTER — Ambulatory Visit: Admitting: Nurse Practitioner
